# Patient Record
Sex: Male | Born: 1945
Health system: Southern US, Community
[De-identification: ages and names within clinical notes are randomized; demographics above are authoritative.]

## PROBLEM LIST (undated history)

## (undated) DIAGNOSIS — C649 Malignant neoplasm of unspecified kidney, except renal pelvis: Secondary | ICD-10-CM

## (undated) DIAGNOSIS — E785 Hyperlipidemia, unspecified: Secondary | ICD-10-CM

## (undated) DIAGNOSIS — E119 Type 2 diabetes mellitus without complications: Secondary | ICD-10-CM

## (undated) DIAGNOSIS — Z86010 Personal history of colonic polyps: Secondary | ICD-10-CM

## (undated) DIAGNOSIS — N529 Male erectile dysfunction, unspecified: Secondary | ICD-10-CM

## (undated) DIAGNOSIS — I10 Essential (primary) hypertension: Secondary | ICD-10-CM

## (undated) HISTORY — DX: Personal history of colonic polyps: Z86.010

## (undated) HISTORY — DX: Male erectile dysfunction, unspecified: N52.9

## (undated) HISTORY — DX: Type 2 diabetes mellitus without complications: E11.9

## (undated) HISTORY — DX: Malignant neoplasm of unspecified kidney, except renal pelvis: C64.9

## (undated) HISTORY — DX: Essential (primary) hypertension: I10

## (undated) HISTORY — DX: Hyperlipidemia, unspecified: E78.5

## (undated) HISTORY — PX: COLONOSCOPY: SHX174

---

## 1990-05-14 HISTORY — PX: NEPHRECTOMY: SHX65

## 2001-01-14 ENCOUNTER — Encounter: Admission: RE | Admit: 2001-01-14 | Discharge: 2001-04-14 | Payer: Self-pay | Admitting: Family Medicine

## 2005-12-24 ENCOUNTER — Ambulatory Visit: Payer: Self-pay | Admitting: Family Medicine

## 2005-12-24 LAB — CONVERTED CEMR LAB: PSA: 0.6 ng/mL

## 2006-01-28 ENCOUNTER — Ambulatory Visit: Payer: Self-pay | Admitting: Family Medicine

## 2006-03-27 ENCOUNTER — Ambulatory Visit: Payer: Self-pay | Admitting: Family Medicine

## 2006-03-27 LAB — CONVERTED CEMR LAB
ALT: 61 units/L — ABNORMAL HIGH (ref 0–40)
AST: 34 units/L (ref 0–37)
BUN: 12 mg/dL (ref 6–23)
CO2: 29 meq/L (ref 19–32)
Calcium: 9.1 mg/dL (ref 8.4–10.5)
Chol/HDL Ratio, serum: 7.2
Cholesterol: 143 mg/dL (ref 0–200)
Creatinine, Ser: 0.9 mg/dL (ref 0.4–1.5)
Glomerular Filtration Rate, Af Am: 111 mL/min/{1.73_m2}
Glucose, Bld: 111 mg/dL — ABNORMAL HIGH (ref 70–99)
Microalb Creat Ratio: 15 mg/g (ref 0.0–30.0)
Microalb, Ur: 2.2 mg/dL — ABNORMAL HIGH (ref 0.0–1.9)
Triglyceride fasting, serum: 170 mg/dL — ABNORMAL HIGH (ref 0–149)

## 2006-06-04 DIAGNOSIS — I10 Essential (primary) hypertension: Secondary | ICD-10-CM | POA: Insufficient documentation

## 2006-06-04 DIAGNOSIS — E1169 Type 2 diabetes mellitus with other specified complication: Secondary | ICD-10-CM | POA: Insufficient documentation

## 2006-06-04 DIAGNOSIS — E785 Hyperlipidemia, unspecified: Secondary | ICD-10-CM

## 2006-06-09 DIAGNOSIS — C649 Malignant neoplasm of unspecified kidney, except renal pelvis: Secondary | ICD-10-CM | POA: Insufficient documentation

## 2006-07-02 ENCOUNTER — Ambulatory Visit: Payer: Self-pay | Admitting: Family Medicine

## 2006-07-02 LAB — CONVERTED CEMR LAB
BUN: 14 mg/dL (ref 6–23)
CO2: 30 meq/L (ref 19–32)
Calcium: 9.3 mg/dL (ref 8.4–10.5)
Chloride: 104 meq/L (ref 96–112)
Cholesterol: 152 mg/dL (ref 0–200)
Creatinine, Ser: 0.8 mg/dL (ref 0.4–1.5)
Glucose, Bld: 97 mg/dL (ref 70–99)
Total CHOL/HDL Ratio: 5.8

## 2006-07-10 ENCOUNTER — Ambulatory Visit: Payer: Self-pay | Admitting: Family Medicine

## 2006-10-16 ENCOUNTER — Ambulatory Visit: Payer: Self-pay | Admitting: Family Medicine

## 2006-10-16 DIAGNOSIS — E1165 Type 2 diabetes mellitus with hyperglycemia: Secondary | ICD-10-CM

## 2006-10-17 LAB — CONVERTED CEMR LAB
AST: 36 units/L (ref 0–37)
Cholesterol: 98 mg/dL (ref 0–200)
HDL: 18.6 mg/dL — ABNORMAL LOW (ref >39.0)
LDL Cholesterol: 52 mg/dL (ref 0–99)
Microalb Creat Ratio: 8.4 mg/g (ref 0.0–30.0)
Total CHOL/HDL Ratio: 5.3
Triglycerides: 136 mg/dL (ref 0–149)
VLDL: 27 mg/dL (ref 0–40)

## 2006-10-30 ENCOUNTER — Ambulatory Visit: Payer: Self-pay | Admitting: Family Medicine

## 2007-01-22 ENCOUNTER — Ambulatory Visit: Payer: Self-pay | Admitting: Family Medicine

## 2007-01-23 LAB — CONVERTED CEMR LAB
AST: 38 units/L — ABNORMAL HIGH (ref 0–37)
CO2: 28 meq/L (ref 19–32)
Calcium: 9 mg/dL (ref 8.4–10.5)
Cholesterol: 127 mg/dL (ref 0–200)
GFR calc Af Amer: 128 mL/min
GFR calc non Af Amer: 106 mL/min
Glucose, Bld: 98 mg/dL (ref 70–99)
HDL: 18.7 mg/dL — ABNORMAL LOW (ref >39.0)
LDL Cholesterol: 72 mg/dL (ref 0–99)
Sodium: 140 meq/L (ref 135–145)
Total CHOL/HDL Ratio: 6.8

## 2007-02-12 ENCOUNTER — Telehealth (INDEPENDENT_AMBULATORY_CARE_PROVIDER_SITE_OTHER): Payer: Self-pay | Admitting: *Deleted

## 2007-02-12 ENCOUNTER — Ambulatory Visit: Payer: Self-pay | Admitting: Family Medicine

## 2007-03-19 ENCOUNTER — Telehealth (INDEPENDENT_AMBULATORY_CARE_PROVIDER_SITE_OTHER): Payer: Self-pay | Admitting: *Deleted

## 2007-03-26 ENCOUNTER — Encounter (INDEPENDENT_AMBULATORY_CARE_PROVIDER_SITE_OTHER): Payer: Self-pay | Admitting: Family Medicine

## 2007-05-05 ENCOUNTER — Telehealth (INDEPENDENT_AMBULATORY_CARE_PROVIDER_SITE_OTHER): Payer: Self-pay | Admitting: *Deleted

## 2007-05-22 ENCOUNTER — Ambulatory Visit: Payer: Self-pay | Admitting: Family Medicine

## 2007-05-22 LAB — CONVERTED CEMR LAB
Cholesterol: 132 mg/dL (ref 0–200)
Hgb A1c MFr Bld: 8.6 % — ABNORMAL HIGH (ref 4.6–6.0)
LDL Cholesterol: 72 mg/dL (ref 0–99)
Total CHOL/HDL Ratio: 5.9
Triglycerides: 191 mg/dL — ABNORMAL HIGH (ref 0–149)

## 2007-05-27 ENCOUNTER — Telehealth (INDEPENDENT_AMBULATORY_CARE_PROVIDER_SITE_OTHER): Payer: Self-pay | Admitting: *Deleted

## 2007-05-29 ENCOUNTER — Ambulatory Visit: Payer: Self-pay | Admitting: Family Medicine

## 2007-08-20 ENCOUNTER — Ambulatory Visit: Payer: Self-pay | Admitting: Internal Medicine

## 2007-08-25 ENCOUNTER — Telehealth (INDEPENDENT_AMBULATORY_CARE_PROVIDER_SITE_OTHER): Payer: Self-pay | Admitting: *Deleted

## 2007-08-25 LAB — CONVERTED CEMR LAB
AST: 36 units/L (ref 0–37)
CO2: 28 meq/L (ref 19–32)
Chloride: 109 meq/L (ref 96–112)
Glucose, Bld: 129 mg/dL — ABNORMAL HIGH (ref 70–99)
Hgb A1c MFr Bld: 8 % — ABNORMAL HIGH (ref 4.6–6.0)
Sodium: 140 meq/L (ref 135–145)

## 2007-08-27 ENCOUNTER — Ambulatory Visit: Payer: Self-pay | Admitting: Internal Medicine

## 2007-11-18 ENCOUNTER — Ambulatory Visit: Payer: Self-pay | Admitting: Internal Medicine

## 2007-11-20 LAB — CONVERTED CEMR LAB
Creatinine,U: 124.9 mg/dL
Hgb A1c MFr Bld: 7.9 % — ABNORMAL HIGH (ref 4.6–6.0)
Microalb Creat Ratio: 62.4 mg/g — ABNORMAL HIGH (ref 0.0–30.0)
Microalb, Ur: 7.8 mg/dL — ABNORMAL HIGH (ref 0.0–1.9)

## 2007-11-24 ENCOUNTER — Ambulatory Visit: Payer: Self-pay | Admitting: Internal Medicine

## 2007-11-24 DIAGNOSIS — G5600 Carpal tunnel syndrome, unspecified upper limb: Secondary | ICD-10-CM

## 2007-11-24 DIAGNOSIS — G5601 Carpal tunnel syndrome, right upper limb: Secondary | ICD-10-CM | POA: Insufficient documentation

## 2007-12-09 ENCOUNTER — Ambulatory Visit: Payer: Self-pay | Admitting: Internal Medicine

## 2007-12-17 ENCOUNTER — Telehealth (INDEPENDENT_AMBULATORY_CARE_PROVIDER_SITE_OTHER): Payer: Self-pay | Admitting: *Deleted

## 2007-12-17 LAB — CONVERTED CEMR LAB
BUN: 18 mg/dL (ref 6–23)
Calcium: 9.2 mg/dL (ref 8.4–10.5)
Creatinine, Ser: 0.9 mg/dL (ref 0.4–1.5)
GFR calc Af Amer: 111 mL/min
Glucose, Bld: 293 mg/dL — ABNORMAL HIGH (ref 70–99)

## 2008-01-15 ENCOUNTER — Telehealth (INDEPENDENT_AMBULATORY_CARE_PROVIDER_SITE_OTHER): Payer: Self-pay | Admitting: *Deleted

## 2008-02-17 ENCOUNTER — Ambulatory Visit: Payer: Self-pay | Admitting: Internal Medicine

## 2008-02-24 ENCOUNTER — Telehealth (INDEPENDENT_AMBULATORY_CARE_PROVIDER_SITE_OTHER): Payer: Self-pay | Admitting: *Deleted

## 2008-03-01 ENCOUNTER — Telehealth (INDEPENDENT_AMBULATORY_CARE_PROVIDER_SITE_OTHER): Payer: Self-pay | Admitting: *Deleted

## 2008-03-10 ENCOUNTER — Encounter: Payer: Self-pay | Admitting: Internal Medicine

## 2008-03-10 ENCOUNTER — Telehealth (INDEPENDENT_AMBULATORY_CARE_PROVIDER_SITE_OTHER): Payer: Self-pay | Admitting: *Deleted

## 2008-03-22 ENCOUNTER — Telehealth (INDEPENDENT_AMBULATORY_CARE_PROVIDER_SITE_OTHER): Payer: Self-pay | Admitting: *Deleted

## 2008-05-25 ENCOUNTER — Ambulatory Visit: Payer: Self-pay | Admitting: Internal Medicine

## 2008-05-28 ENCOUNTER — Telehealth (INDEPENDENT_AMBULATORY_CARE_PROVIDER_SITE_OTHER): Payer: Self-pay | Admitting: *Deleted

## 2008-05-31 ENCOUNTER — Ambulatory Visit: Payer: Self-pay | Admitting: Internal Medicine

## 2008-06-01 ENCOUNTER — Telehealth (INDEPENDENT_AMBULATORY_CARE_PROVIDER_SITE_OTHER): Payer: Self-pay | Admitting: *Deleted

## 2008-06-01 LAB — CONVERTED CEMR LAB
ALT: 46 units/L (ref 0–53)
AST: 26 units/L (ref 0–37)
Hgb A1c MFr Bld: 7.7 % — ABNORMAL HIGH (ref 4.6–6.0)

## 2008-10-04 ENCOUNTER — Ambulatory Visit: Payer: Self-pay | Admitting: Internal Medicine

## 2008-10-08 ENCOUNTER — Ambulatory Visit: Payer: Self-pay | Admitting: Internal Medicine

## 2008-10-13 ENCOUNTER — Telehealth (INDEPENDENT_AMBULATORY_CARE_PROVIDER_SITE_OTHER): Payer: Self-pay | Admitting: *Deleted

## 2008-10-13 LAB — CONVERTED CEMR LAB
BUN: 15 mg/dL (ref 6–23)
Calcium: 9 mg/dL (ref 8.4–10.5)
Chloride: 111 meq/L (ref 96–112)
Creatinine, Ser: 0.8 mg/dL (ref 0.4–1.5)
Hgb A1c MFr Bld: 7.1 % — ABNORMAL HIGH (ref 4.6–6.5)

## 2009-02-08 ENCOUNTER — Telehealth (INDEPENDENT_AMBULATORY_CARE_PROVIDER_SITE_OTHER): Payer: Self-pay | Admitting: *Deleted

## 2009-02-08 ENCOUNTER — Ambulatory Visit: Payer: Self-pay | Admitting: Internal Medicine

## 2009-02-23 ENCOUNTER — Ambulatory Visit: Payer: Self-pay | Admitting: Internal Medicine

## 2009-03-03 LAB — CONVERTED CEMR LAB
AST: 27 units/L (ref 0–37)
Cholesterol: 138 mg/dL (ref 0–200)
Hgb A1c MFr Bld: 7 % — ABNORMAL HIGH (ref 4.6–6.5)
Microalb Creat Ratio: 14.8 mg/g (ref 0.0–30.0)
Microalb, Ur: 1.7 mg/dL (ref 0.0–1.9)
Total CHOL/HDL Ratio: 6
VLDL: 49.4 mg/dL — ABNORMAL HIGH (ref 0.0–40.0)

## 2009-03-08 ENCOUNTER — Encounter: Payer: Self-pay | Admitting: Internal Medicine

## 2009-03-11 ENCOUNTER — Encounter: Payer: Self-pay | Admitting: Internal Medicine

## 2009-05-31 ENCOUNTER — Ambulatory Visit: Payer: Self-pay | Admitting: Internal Medicine

## 2009-06-02 LAB — CONVERTED CEMR LAB
Basophils Relative: 0.7 % (ref 0.0–3.0)
Chloride: 105 meq/L (ref 96–112)
Creatinine,U: 74.7 mg/dL
Direct LDL: 45.7 mg/dL
Eosinophils Relative: 9.3 % — ABNORMAL HIGH (ref 0.0–5.0)
GFR calc non Af Amer: 104.37 mL/min (ref >60)
HCT: 43.1 % (ref 39.0–52.0)
HDL: 23.3 mg/dL — ABNORMAL LOW (ref >39.00)
Hemoglobin: 14.1 g/dL (ref 13.0–17.0)
Hgb A1c MFr Bld: 7.5 % — ABNORMAL HIGH (ref 4.6–6.5)
Lymphs Abs: 1.6 10*3/uL (ref 0.7–4.0)
MCV: 99.3 fL (ref 78.0–100.0)
Microalb Creat Ratio: 17.4 mg/g (ref 0.0–30.0)
Microalb, Ur: 1.3 mg/dL (ref 0.0–1.9)
Monocytes Relative: 11.2 % (ref 3.0–12.0)
Neutro Abs: 3.1 10*3/uL (ref 1.4–7.7)
Potassium: 4.7 meq/L (ref 3.5–5.1)
Sodium: 141 meq/L (ref 135–145)
VLDL: 40.8 mg/dL — ABNORMAL HIGH (ref 0.0–40.0)
WBC: 5.9 10*3/uL (ref 4.5–10.5)

## 2009-06-07 ENCOUNTER — Ambulatory Visit: Payer: Self-pay | Admitting: Internal Medicine

## 2009-06-08 ENCOUNTER — Encounter (INDEPENDENT_AMBULATORY_CARE_PROVIDER_SITE_OTHER): Payer: Self-pay | Admitting: *Deleted

## 2009-06-09 ENCOUNTER — Encounter (INDEPENDENT_AMBULATORY_CARE_PROVIDER_SITE_OTHER): Payer: Self-pay | Admitting: *Deleted

## 2009-06-20 ENCOUNTER — Telehealth: Payer: Self-pay | Admitting: Internal Medicine

## 2009-06-29 ENCOUNTER — Encounter (INDEPENDENT_AMBULATORY_CARE_PROVIDER_SITE_OTHER): Payer: Self-pay | Admitting: *Deleted

## 2009-07-04 ENCOUNTER — Encounter (INDEPENDENT_AMBULATORY_CARE_PROVIDER_SITE_OTHER): Payer: Self-pay | Admitting: *Deleted

## 2009-07-04 ENCOUNTER — Ambulatory Visit: Payer: Self-pay | Admitting: Internal Medicine

## 2009-07-12 LAB — HM COLONOSCOPY

## 2009-07-18 ENCOUNTER — Ambulatory Visit: Payer: Self-pay | Admitting: Internal Medicine

## 2009-07-18 DIAGNOSIS — Z860100 Personal history of colon polyps, unspecified: Secondary | ICD-10-CM | POA: Insufficient documentation

## 2009-07-18 HISTORY — DX: Personal history of colonic polyps: Z86.010

## 2009-07-18 HISTORY — DX: Personal history of colon polyps, unspecified: Z86.0100

## 2009-07-23 ENCOUNTER — Telehealth: Payer: Self-pay | Admitting: Gastroenterology

## 2009-07-25 ENCOUNTER — Telehealth: Payer: Self-pay | Admitting: Internal Medicine

## 2009-07-26 ENCOUNTER — Encounter: Payer: Self-pay | Admitting: Internal Medicine

## 2009-08-03 ENCOUNTER — Encounter: Payer: Self-pay | Admitting: Internal Medicine

## 2009-08-30 ENCOUNTER — Encounter: Payer: Self-pay | Admitting: Internal Medicine

## 2009-10-12 ENCOUNTER — Encounter: Payer: Self-pay | Admitting: Internal Medicine

## 2009-12-07 ENCOUNTER — Encounter: Payer: Self-pay | Admitting: Internal Medicine

## 2009-12-21 ENCOUNTER — Ambulatory Visit: Payer: Self-pay | Admitting: Internal Medicine

## 2009-12-21 DIAGNOSIS — N529 Male erectile dysfunction, unspecified: Secondary | ICD-10-CM

## 2010-02-23 ENCOUNTER — Encounter (INDEPENDENT_AMBULATORY_CARE_PROVIDER_SITE_OTHER): Payer: Self-pay | Admitting: *Deleted

## 2010-03-23 ENCOUNTER — Encounter: Payer: Self-pay | Admitting: Internal Medicine

## 2010-04-26 ENCOUNTER — Encounter: Payer: Self-pay | Admitting: Internal Medicine

## 2010-04-26 ENCOUNTER — Ambulatory Visit: Payer: Self-pay | Admitting: Internal Medicine

## 2010-06-11 LAB — CONVERTED CEMR LAB
ALT: 58 units/L — ABNORMAL HIGH (ref 0–53)
Basophils Relative: 0.3 % (ref 0.0–3.0)
Bilirubin, Direct: 0.1 mg/dL (ref 0.0–0.3)
Eosinophils Relative: 4.1 % (ref 0.0–5.0)
HCT: 46.1 % (ref 39.0–52.0)
HDL: 25.2 mg/dL — ABNORMAL LOW (ref >39.0)
Hemoglobin: 15.9 g/dL (ref 13.0–17.0)
Microalb, Ur: 14.2 mg/dL — ABNORMAL HIGH (ref 0.0–1.9)
Monocytes Absolute: 0.6 10*3/uL (ref 0.1–1.0)
Monocytes Relative: 8.8 % (ref 3.0–12.0)
PSA: 0.58 ng/mL (ref 0.10–4.00)
Platelets: 242 10*3/uL (ref 150–400)
RBC: 4.76 M/uL (ref 4.22–5.81)
TSH: 0.72 microintl units/mL (ref 0.35–5.50)
Total CHOL/HDL Ratio: 5.1
Total Protein: 7.4 g/dL (ref 6.0–8.3)
WBC: 6.3 10*3/uL (ref 4.5–10.5)

## 2010-06-13 NOTE — Letter (Signed)
Summary: [facility] Endocrinology & Diabetes  [facility] Endocrinology & Diabetes   Imported By: Lanelle Bal 10/27/2009 09:18:04  _____________________________________________________________________  External Attachment:    Type:   Image     Comment:   External Document

## 2010-06-13 NOTE — Miscellaneous (Signed)
Summary: flu shot @ [facility]    Immunization History:  Influenza Immunization History:    Influenza:  given @ [facility] (02/19/2010)

## 2010-06-13 NOTE — Consult Note (Signed)
Summary: [facility] Endocrinology & Diabetes  [facility] Endocrinology & Diabetes   Imported By: Lanelle Bal 08/09/2009 11:46:45  _____________________________________________________________________  External Attachment:    Type:   Image     Comment:   External Document

## 2010-06-13 NOTE — Letter (Signed)
Summary: [facility] Endocrinology & Diabetes  [facility] Endocrinology & Diabetes   Imported By: Lanelle Bal 12/14/2009 14:04:54  _____________________________________________________________________  External Attachment:    Type:   Image     Comment:   External Document

## 2010-06-13 NOTE — Letter (Signed)
Summary: Primary Care Consult Scheduled Letter  [facility] [place]  [address] [place], [place] [address]   Phone: [phone]  Fax: [phone]      06/09/2009 MRN: [id number]  SALVADORE VALVANO [address] [place], [place]  [address]    Dear Mr. DILWORTH,    We have scheduled an appointment for you.  At the recommendation of Dr. Wanda Plump, we have scheduled you a consult with Dr. Reather Littler with [facility] Endocrinology & Diabetes on 07-14-2009 at 11:15am.  Their address is [address], [place] [address]. The office phone number is [phone].  If this appointment day and time is not convenient for you, please feel free to call the office of the doctor you are being referred to at the number listed above and reschedule the appointment.    It is important for you to keep your scheduled appointments. We are here to make sure you are given good patient care.   Thank you,    Renee, Patient Care Coordinator [facility]

## 2010-06-13 NOTE — Letter (Signed)
Summary: Diabetic Instructions  [facility]  [address] [place], [place] [address]   Phone: [phone]  Fax: [phone]    Manuel Petty 1946/04/19 MRN: [id number]   _  _   ORAL DIABETIC MEDICATION INSTRUCTIONS  The day before your procedure:   Take your diabetic pill as you do normally  The day of your procedure:   Do not take your diabetic pill    We will check your blood sugar levels during the admission process and again in Recovery before discharging you home  ________________________________________________________________________  _  _   INSULIN (LONG ACTING) MEDICATION INSTRUCTIONS (Lantus, NPH, 70/30, Humulin, Novolin-N)   The day before your procedure:   Take  your regular evening dose    The day of your procedure:   Do not take your morning dose

## 2010-06-13 NOTE — Letter (Signed)
Summary: [facility] Endocrinology & Diabetes  [facility] Endocrinology & Diabetes   Imported By: Lanelle Bal 04/05/2010 12:38:50  _____________________________________________________________________  External Attachment:    Type:   Image     Comment:   External Document

## 2010-06-13 NOTE — Letter (Signed)
Summary: Patient Notice- Polyp Results  [facility]  [address] [place], [place] [address]   Phone: [phone]  Fax: [phone]        July 26, 2009 MRN: [id number]    Manuel Petty [address] [place], [place]  [address]    Dear Mr. PALMERI,  The polyps removed from your colon were adenomatous. This means that they were pre-cancerous or that  they had the potential to change into cancer over time.   I recommend that you have a repeat colonoscopy in 3 years to determine if you have developed any new polyps over time. If you develop any new rectal bleeding, abdominal pain or significant bowel habit changes, please contact us before then.  In addition to repeating colonoscopy, changing health habits may reduce your risk of having more colon polyps and possibly, colon cancer. You may lower your risk of future polyps and colon cancer by adopting healthy habits such as not smoking or using tobacco (if you do), being physically active, losing weight (if overweight), and eating a diet which includes fruits and vegetables and limits red meat.  Please call us if you are having persistent problems or have questions about your condition that have not been fully answered at this time.   Sincerely,  Iva Boop MD, [facility]  This letter has been electronically signed by your physician.  Appended Document: Patient Notice- Polyp Results Letter mailed 3.16.11

## 2010-06-13 NOTE — Letter (Signed)
Summary: [facility] Instructions  [facility]  [address] [place], [place] [address]   Phone: [phone]  Fax: [phone]       Manuel Petty    [birth year]    MRN: [id number]        Procedure Day /Date: Monday 07/18/2009     Arrival Time: 7:30 am      Procedure Time: 8:30 am     Location of Procedure:                    _x _   Endoscopy Center (4th Floor)   PREPARATION FOR COLONOSCOPY WITH MOVIPREP   Starting 5 days prior to your procedure Wednesday 3/2 do not eat nuts, seeds, popcorn, corn, beans, peas,  salads, or any raw vegetables.  Do not take any fiber supplements (e.g. Metamucil, Citrucel, and Benefiber).  THE DAY BEFORE YOUR PROCEDURE         DATE: Sunday 3/6  1.  Drink clear liquids the entire day-NO SOLID FOOD  2.  Do not drink anything colored red or purple.  Avoid juices with pulp.  No orange juice.  3.  Drink at least 64 oz. (8 glasses) of fluid/clear liquids during the day to prevent dehydration and help the prep work efficiently.  CLEAR LIQUIDS INCLUDE: Water Jello Ice Popsicles Tea (sugar ok, no milk/cream) Powdered fruit flavored drinks Coffee (sugar ok, no milk/cream) Gatorade Juice: apple, white grape, white cranberry  Lemonade Clear bullion, consomm, broth Carbonated beverages (any kind) Strained chicken noodle soup Hard Candy                             4.  In the morning, mix first dose of MoviPrep solution:    Empty 1 Pouch A and 1 Pouch B into the disposable container    Add lukewarm drinking water to the top line of the container. Mix to dissolve    Refrigerate (mixed solution should be used within 24 hrs)  5.  Begin drinking the prep at 5:00 p.m. The MoviPrep container is divided by 4 marks.   Every 15 minutes drink the solution down to the next mark (approximately 8 oz) until the full liter is complete.   6.  Follow completed prep with 16 oz of clear liquid of your choice (Nothing red or purple).   Continue to drink clear liquids until bedtime.  7.  Before going to bed, mix second dose of MoviPrep solution:    Empty 1 Pouch A and 1 Pouch B into the disposable container    Add lukewarm drinking water to the top line of the container. Mix to dissolve    Refrigerate  THE DAY OF YOUR PROCEDURE      DATE: Monday 3/7  Beginning at 3:30 a.m. (5 hours before procedure):         1. Every 15 minutes, drink the solution down to the next mark (approx 8 oz) until the full liter is complete.  2. Follow completed prep with 16 oz. of clear liquid of your choice.    3. You may drink clear liquids until 6:30 am (2 HOURS BEFORE PROCEDURE).   MEDICATION INSTRUCTIONS  Unless otherwise instructed, you should take regular prescription medications with a small sip of water   as early as possible the morning of your procedure.  Diabetic patients - see separate instructions.   Additional medication instructions: Hold Lisinopril/HCTZ morning of procedure.  OTHER INSTRUCTIONS  You will need a responsible adult at least 62 years of age to accompany you and drive you home.   This person must remain in the waiting room during your procedure.  Wear loose fitting clothing that is easily removed.  Leave jewelry and other valuables at home.  However, you may wish to bring a book to read or  an iPod/MP3 player to listen to music as you wait for your procedure to start.  Remove all body piercing jewelry and leave at home.  Total time from sign-in until discharge is approximately 2-3 hours.  You should go home directly after your procedure and rest.  You can resume normal activities the  day after your procedure.  The day of your procedure you should not:   Drive   Make legal decisions   Operate machinery   Drink alcohol   Return to work  You will receive specific instructions about eating, activities and medications before you leave.    The above instructions have been  reviewed and explained to me by   Ezra Sites RN  July 04, 2009 8:11 AM   I fully understand and can verbalize these instructions _____________________________ Date _________

## 2010-06-13 NOTE — Letter (Signed)
Summary: Previsit letter  [facility]  [address] [place], [place] [address]   Phone: [phone]  Fax: [phone]       06/08/2009 MRN: [id number]  Manuel Petty [address] [place], [place]  [address]  Dear [name],  Welcome to the Gastroenterology Division at [facility].    You are scheduled to see a nurse for your pre-procedure visit on 07-04-09 at 8:00a.m. on the 3rd floor at [facility], [address]  We ask that you try to arrive at our office 15 minutes prior to your appointment time to allow for check-in.  Your nurse visit will consist of discussing your medical and surgical history, your immediate family medical history, and your medications.    Please bring a complete list of all your medications or, if you prefer, bring the medication bottles and we will list them.  We will need to be aware of both prescribed and over the counter drugs.  We will need to know exact dosage information as well.  If you are on blood thinners (Coumadin, Plavix, Aggrenox, Ticlid, etc.) please call our office today/prior to your appointment, as we need to consult with your physician about holding your medication.   Please be prepared to read and sign documents such as consent forms, a financial agreement, and acknowledgement forms.  If necessary, and with your consent, a friend or relative is welcome to sit-in on the nurse visit with you.  Please bring your insurance card so that we may make a copy of it.  If your insurance requires a referral to see a specialist, please bring your referral form from your primary care physician.  No co-pay is required for this nurse visit.     If you cannot keep your appointment, please call [phone] to cancel or reschedule prior to your appointment date.  This allows [place] the opportunity to schedule an appointment for another patient in need of care.    Thank you for choosing [facility] for your  medical needs.  We appreciate the opportunity to care for you.  Please visit [place] at our website  to learn more about our practice.                     Sincerely.                                                                                                                   The Gastroenterology Division

## 2010-06-13 NOTE — Procedures (Signed)
Summary: Colonoscopy  Patient: Manuel Petty Note: All result statuses are Final unless otherwise noted.  Tests: (1) Colonoscopy (COL)   COL Colonoscopy           DONE     [facility]     [address]     [place], [place]  [address]           COLONOSCOPY PROCEDURE REPORT           PATIENT:  Manuel, Petty  MR#:  [id number]     BIRTHDATE:  04/16/1946, 63 yrs. old  GENDER:  male           ENDOSCOPIST:  Iva Boop, MD, [facility]     Referred by:  Willow Ora, M.D.           PROCEDURE DATE:  07/18/2009     PROCEDURE:  Colonoscopy with biopsy and snare polypectomy     ASA CLASS:  Class II     INDICATIONS:  Routine Risk Screening           MEDICATIONS:   Fentanyl 100 mcg IV, Versed 9 mg           DESCRIPTION OF PROCEDURE:   After the risks benefits and     alternatives of the procedure were thoroughly explained, informed     consent was obtained.  Digital rectal exam was performed and     revealed no abnormalities and normal prostate.   The LB CF-H180AL     [id number] endoscope was introduced through the anus and advanced to     the cecum, which was identified by both the appendix and ileocecal     valve, limited by a redundant colon.  Supine position and     bilateral abdominal pressure with return to left side and     bilateral abdominal pressure required.  The quality of the prep     was adequate, using MoviPrep.  The instrument was then slowly     withdrawn as the colon was fully examined.     Insertion: 18:19 minutes Withdrawal: 18:41 minutes     <<PROCEDUREIMAGES>>           FINDINGS:  Two polyps were found in the ascending colon. They were     diminutive. One polyp was removed using cold biopsy forceps. One     polyp was snared without cautery. Retrieval was successful. A     pedunculated polyp was found in the distal sigmoid colon. It was     12 mm in size. Polyp was snared, then cauterized with monopolar     cautery. Retrieval was successful.  A sessile  polyp was found in     the more proximal sigmoid colon. It was 7 mm in size. Polyp was     snared, then cauterized with monopolar cautery. Retrieval was     successful.   Two polyps were found in the rectum. 8 mm flat polyp     and a 1 cm pedunculated polyp.The pedunculated polyp was snared,     then cauterized with monopolar cautery. Retrieval was     successful.The flat polyp was snared without cautery. Retrieval     was successful. This was otherwise a normal examination of the     colon. It was redundant.   Retroflexed views in the rectum     revealed no abnormalities.    The scope was then withdrawn from     the patient and the procedure completed.  COMPLICATIONS:  None           ENDOSCOPIC IMPRESSION:     1) Two polyps in the ascending colon - removed     2) 12 mm pedunculated polyp in the sigmoid colon - removed     3) 7 mm sessile polyp in the sigmoid colon - removed     4) Two polyps in the rectum - removed     5) Otherwise normal examination - adequate prep     6) Moderately redundnat colon with moderately difficult     insertion           6 polyps total           RECOMMENDATIONS:     1) No aspirin or NSAID's for 2 weeks           REPEAT EXAM:  In for Colonoscopy, pending biopsy results.           Iva Boop, MD, [facility]           CC:  Willow Ora, MD     The Patient           n.     eSIGNED:   Iva Boop at 07/18/2009 09:27 AM           Page 2 of 3   Ayyub, Krall, [id number]  Note: An exclamation mark (!) indicates a result that was not dispersed into the flowsheet. Document Creation Date: 07/18/2009 9:27 AM _______________________________________________________________________  (1) Order result status: Final Collection or observation date-time: 07/18/2009 09:14 Requested date-time:  Receipt date-time:  Reported date-time:  Referring Physician:   Ordering Physician: Stan Head [phone]) Specimen Source:  Source: Launa Grill  Order Number: [id number] Lab site:   Appended Document: Colonoscopy     Procedures Next Due Date:    Colonoscopy: 07/2012

## 2010-06-13 NOTE — Progress Notes (Signed)
Summary: ?change metformin?  Phone Note Call from Patient Call back at [phone]   Details for Reason: PATIENT LEFT MESSAGE ON  VM REQUESTING RETURN CALL Summary of Call: left message on machine to return call Shary Decamp  June 20, 2009 10:59 AM  Elmon Kirschner xr 1000mg  2 daily was very expensive.  Ok to change to metformin xr 500mg  4 daily? (generic does not come in 1000mg ), [facility] Shary Decamp  June 20, 2009 1:30 PM    Follow-up for Phone Call        okay as long as it is the extended release formulation Follow-up by: Stavroula Rohde E. Jovoni Borkenhagen MD,  June 20, 2009 3:15 PM    New/Updated Medications: GLUCOPHAGE XR 500 MG XR24H-TAB (METFORMIN HCL) 4 by mouth once daily Prescriptions: GLUCOPHAGE XR 500 MG XR24H-TAB (METFORMIN HCL) 4 by mouth once daily  #360 x 2   Entered by:   Shary Decamp   Authorized by:   Nolon Rod. Adyline Huberty MD   Signed by:   Shary Decamp on 06/20/2009   Method used:   Printed then faxed to ...       [facility] (mail-order)             , [place]         Ph: [phone]       Fax: [phone]   RxID:   [id number]

## 2010-06-13 NOTE — Letter (Signed)
Summary: [facility] Endocrinology & Diabetes  [facility] Endocrinology & Diabetes   Imported By: Lanelle Bal 09/05/2009 13:27:14  _____________________________________________________________________  External Attachment:    Type:   Image     Comment:   External Document

## 2010-06-13 NOTE — Progress Notes (Signed)
  Phone Note Call from Patient   Caller: Patient Summary of Call: had red rectal bleeding times one this AM, next BM was darker (more solid with some stool).  No abd pain, no orthostatic symptoms.  feels otherwise fine.  I told him to rest today, if he has more rectal bleeding or feels orthostatic, cp, sob to call back or go to ER. Initial call taken by: Rachael Fee MD,  July 23, 2009 2:37 PM

## 2010-06-13 NOTE — Miscellaneous (Signed)
Summary: [facility]  Clinical Lists Changes  Medications: Added new medication of MOVIPREP 100 GM  SOLR (PEG-KCL-NACL-NASULF-NA ASC-C) As per prep instructions. - Signed Rx of MOVIPREP 100 GM  SOLR (PEG-KCL-NACL-NASULF-NA ASC-C) As per prep instructions.;  #1 x 0;  Signed;  Entered by: Ezra Sites RN;  Authorized by: Iva Boop MD, [facility];  Method used: Electronically to [facility] [id number]*, [address] [place], [place], [place]  [address], Ph: [phone] or [phone], Fax: [phone] Observations: Added new observation of NKA: T (07/04/2009 7:49)    Prescriptions: MOVIPREP 100 GM  SOLR (PEG-KCL-NACL-NASULF-NA ASC-C) As per prep instructions.  #1 x 0   Entered by:   Ezra Sites RN   Authorized by:   Iva Boop MD, [facility]   Signed by:   Ezra Sites RN on 07/04/2009   Method used:   Electronically to        [facility] [address] [id number] (retail)       [address]       [place], [place]  [address]       Ph: [phone] or [phone]       Fax: [phone]   RxID:   [id number]

## 2010-06-13 NOTE — Assessment & Plan Note (Signed)
Summary: cpx/kdc  Flu Vaccine Consent Questions     Do you have a history of severe allergic reactions to this vaccine? no    Any prior history of allergic reactions to egg and/or gelatin? no    Do you have a sensitivity to the preservative Thimersol? no    Do you have a past history of Guillan-Barre Syndrome? no    Do you currently have an acute febrile illness? no    Have you ever had a severe reaction to latex? no    Vaccine information given and explained to patient? yes    Are you currently pregnant? no    Lot Number:[id number]   Exp Date:11/10/2009   Site Given right Deltoid IM Shary Decamp  June 07, 2009 4:05 PM   Vital Signs:  Patient profile:   65 year old male Height:      70 inches Weight:      185.4 pounds BMI:     26.70 Pulse rate:   68 / minute BP sitting:   120 / 70  Vitals Entered By: Shary Decamp (June 07, 2009 3:25 PM) .lbCC: cpx  Comments  - pt takes metformin 2 once daily instead of two times a day, states if he has to take two times a day he will not remember to take 2nd dose  - never had colonoscopy Shary Decamp  June 07, 2009 3:26 PM    History of Present Illness: CPX   HYPERTENSION  ambulatory BPs 120s , good medication compliance   DM pt takes metformin 2 once daily instead of two times a day, states if he has to take two times a day he will not remember to take 2nd dose     Preventive Screening-Counseling & Management  Alcohol-Tobacco     Alcohol drinks/day: 1-2 a week     Smoking Status: never  Caffeine-Diet-Exercise     Does Patient Exercise: no  Current Medications (verified): 1)  Lantus Solostar 100 Unit/ml Soln (Insulin Glargine) .... 65-75 U At Bedtime 2)  Lovastatin 40 Mg Tabs (Lovastatin) .... Take One Tablet Daily 3)  Adult Aspirin Ec Low Strength 81 Mg  Tbec (Aspirin) 4)  Bd Ultra-Fine Pen Needles 29g X 12.59mm Misc (Insulin Pen Needle) .... As Directed 5)  Lisinopril-Hydrochlorothiazide 20-25 Mg Tabs  (Lisinopril-Hydrochlorothiazide) .Marland Kitchen.. 1 By Mouth Once Daily 6)  Metformin Hcl 1000 Mg Tabs (Metformin Hcl) .... Two Times A Day 7)  Accu-Chek Aviva  Strp (Glucose Blood) .... Test Three Times A Day  Allergies (verified): No Known Drug Allergies  Past History:  Past Medical History: DM, UNCOMPLICATED, TYPE II, UNCONTROLLED  Dx aprox. 2000 HYPERLIPIDEMIA   HYPERTENSION   RENAL CELL CANCER 1992, has 1 kidney ; does not see urology any more ED  Past Surgical History: Reviewed history from 02/17/2008 and no changes required. Left Nephrectomy 1992  Family History: CAD - F (MI 63), bro HTN - F DM - F prostate Ca - bro colon Ca - no stroke - no F - deceased MI M- deceased brain tumor  Social History: Married 1 children [employer]  Never Smoked Alcohol use-no Drug use-no exercise- active but not exercising regulalrly diet-- healthy Does Patient Exercise:  no  Review of Systems General:  Denies fatigue, fever, and weight loss. CV:  Denies chest pain or discomfort and swelling of feet. Resp:  Denies cough and shortness of breath. GI:  Denies bloody stools, diarrhea, nausea, and vomiting. GU:  Denies hematuria, urinary frequency, and urinary hesitancy. Neuro:  Denies headaches. Psych:  Denies anxiety and depression.  Physical Exam  General:  alert and well-developed.  alert and well-developed.   Neck:  no masses, no thyromegaly, and normal carotid upstroke.  no masses, no thyromegaly, and normal carotid upstroke.   Lungs:  normal respiratory effort, no intercostal retractions, no accessory muscle use, and normal breath sounds.    Heart:  normal rate, regular rhythm, no murmur, and no gallop.   Abdomen:  soft, non-tender, no distention, no masses, no guarding, and no rigidity.  soft, non-tender, no distention, no masses, no guarding, and no rigidity.   Rectal:  No external abnormalities noted. Normal sphincter tone. he has a small polyp at 6 o'clock in the rectum,  Hemoccult negative Prostate:  Prostate gland firm and smooth, no enlargement, nodularity, tenderness, mass, asymmetry or induration. Extremities:  no edema  Diabetes Management Exam:    Foot Exam (with socks and/or shoes not present):       Sensory-Pinprick/Light touch:          Left medial foot (L-4): normal          Left dorsal foot (L-5): normal          Left lateral foot (S-1): normal          Right medial foot (L-4): normal          Right dorsal foot (L-5): normal          Right lateral foot (S-1): normal       Sensory-Monofilament:          Left foot: normal          Right foot: normal       Inspection:          Left foot: normal          Right foot: normal       Nails:          Left foot: normal          Right foot: normal   Impression & Recommendations:  Problem # 1:  HEALTH SCREENING (ICD-V70.0) Td 2009 pneumonia shot 2009 flu shot : pro/cons  discussed he agreed to take one never had a colonoscopy, DRE showed a polyp: referral for colonoscopy all labs reviewed, check a PSA  Orders: Venipuncture (11914) TLB-PSA (Prostate Specific Antigen) (84153-PSA) Gastroenterology Referral (GI)  Problem # 2:  DM, UNCOMPLICATED, TYPE II, UNCONTROLLED (ICD-250.02)  last eye exam 02/2009, negative lantus dose betwen 60 to 70, patient decides whether to take more or less insulin based on his diet has occ. nocturnal hypoglycemia ambulatory CBGs in AM 80 to 130  he is chronically under control, referred to endocrinology: Multi-injection regimen?  [place]?---likes to see Dr Lucianne Muss has a hard time remembering taking metformin twice a day, changed to metformin XR His updated medication list for this problem includes:    Lantus Solostar 100 Unit/ml Soln (Insulin glargine) .Marland KitchenMarland KitchenMarland KitchenMarland Kitchen 65-75 u at bedtime    Adult Aspirin Ec Low Strength 81 Mg Tbec (Aspirin)    Lisinopril-hydrochlorothiazide 20-25 Mg Tabs (Lisinopril-hydrochlorothiazide) .Marland Kitchen... 1 by mouth once daily    Glumetza 1000 Mg  Xr24h-tab (Metformin hcl) .Marland Kitchen..Marland Kitchen Two tablets by mouth daily  Orders: Endocrinology Referral (Endocrine)  Problem # 3:  HYPERLIPIDEMIA NEC/NOS (ICD-272.4) triglycerides continue to be elevated, anticipate TG will get better once DM better His updated medication list for this problem includes:    Lovastatin 40 Mg Tabs (Lovastatin) .Marland Kitchen... Take one tablet daily  Problem # 4:  HYPERTENSION (ICD-401.9)  at goal  His updated medication list for this problem includes:    Lisinopril-hydrochlorothiazide 20-25 Mg Tabs (Lisinopril-hydrochlorothiazide) .Marland Kitchen... 1 by mouth once daily  BP today: 120/70 Prior BP: 120/82 (02/08/2009)  Labs Reviewed: K+: 4.7 (05/31/2009) Creat: : 0.8 (05/31/2009)   Chol: 83 (05/31/2009)   HDL: 23.30 (05/31/2009)   LDL: 73 (02/17/2008)   TG: 204.0 (05/31/2009)  Problem # 5:  in addition to CPX we spent additional 15 min on his chronic medical problems  Complete Medication List: 1)  Lantus Solostar 100 Unit/ml Soln (Insulin glargine) .... 65-75 u at bedtime 2)  Lovastatin 40 Mg Tabs (Lovastatin) .... Take one tablet daily 3)  Adult Aspirin Ec Low Strength 81 Mg Tbec (Aspirin) 4)  Bd Ultra-fine Pen Needles 29g X 12.67mm Misc (Insulin pen needle) .... As directed 5)  Lisinopril-hydrochlorothiazide 20-25 Mg Tabs (Lisinopril-hydrochlorothiazide) .Marland Kitchen.. 1 by mouth once daily 6)  Glumetza 1000 Mg Xr24h-tab (Metformin hcl) .... Two tablets by mouth daily 7)  Accu-chek Aviva Strp (Glucose blood) .... Test three times a day  Other Orders: Admin 1st Vaccine (16109) Flu Vaccine 56yrs + (60454)  Patient Instructions: 1)  Please schedule a follow-up appointment in 6 months .  Prescriptions: GLUMETZA 1000 MG XR24H-TAB (METFORMIN HCL) two tablets by mouth daily  #180 x 3   Entered and Authorized by:   Elita Quick E. Laisha Rau MD   Signed by:   Nolon Rod. Contina Strain MD on 06/07/2009   Method used:   Print then Give to Patient   RxID:   [id number] GLUMETZA 1000 MG XR24H-TAB (METFORMIN HCL) two tablets  by mouth daily  #60 x 6   Entered and Authorized by:   Nolon Rod. Hannelore Bova MD   Signed by:   Nolon Rod. Jayland Null MD on 06/07/2009   Method used:   Reprint   RxID:   [id number] GLUMETZA 1000 MG XR24H-TAB (METFORMIN HCL) two tablets by mouth daily  #60 x 6   Entered and Authorized by:   Nolon Rod. Haedyn Breau MD   Signed by:   Nolon Rod. Lurleen Soltero MD on 06/07/2009   Method used:   Electronically to        [id number] (retail)       [address]       [place], [place]  [address]       Ph: [phone] or [phone]       Fax: [phone]   RxID:   [id number]    Family History:    CAD - F (MI 73), bro    HTN - F    DM - F    prostate Ca - bro    colon Ca - no    stroke - no    F - deceased MI    M- deceased brain tumor  Social History:    Married    1 children    [employer]     Never Smoked    Alcohol use-no    Drug use-no    exercise- active but not exercising regulalrly    diet-- healthy     Immunization History:  Tetanus/Td Immunization History:    Tetanus/Td:  Tdap (08/27/2007)  Influenza Immunization History:    Influenza:  Fluvax 3+ (06/07/2009)  Pneumovax Immunization History:    Pneumovax:  Pneumovax (02/17/2008)  Not Administered:    Influenza Vaccine not given due to: declined

## 2010-06-13 NOTE — Assessment & Plan Note (Signed)
Summary: 6 mo. f/u - jr   Vital Signs:  Patient profile:   65 year old male Weight:      161.38 pounds Pulse rate:   74 / minute Pulse rhythm:   regular BP sitting:   122 / 80  (left arm) Cuff size:   regular  Vitals Entered By: Army Fossa CMA (December 21, 2009 9:50 AM) CC: 6 month f/u: had juice this am   History of Present Illness:  routine office visit Doing great Endocrinology put him on victoza  and decreased Lantus dose. CBGs  are essentially normal, occasionally low sugars   (with symptoms).  Current Medications (verified): 1)  Lantus Solostar 100 Unit/ml Soln (Insulin Glargine) .... 25 U At Bedtime 2)  Lovastatin 40 Mg Tabs (Lovastatin) .... Take One Tablet Daily 3)  Adult Aspirin Ec Low Strength 81 Mg  Tbec (Aspirin) 4)  Bd Ultra-Fine Pen Needles 29g X 12.17mm Misc (Insulin Pen Needle) .... As Directed 5)  Lisinopril-Hydrochlorothiazide 20-25 Mg Tabs (Lisinopril-Hydrochlorothiazide) .Marland Kitchen.. 1 By Mouth Once Daily 6)  Glucophage 1000 Mg Tabs (Metformin Hcl) .... 2 By Mouth Daily 7)  Accu-Chek Aviva  Strp (Glucose Blood) .... Test Three Times A Day 8)  Victoza 18 Mg/75ml Soln (Liraglutide) .... 1.2 Daily  Allergies (verified): No Known Drug Allergies  Past History:  Past Medical History: Reviewed history from 06/07/2009 and no changes required. DM, UNCOMPLICATED, TYPE II, UNCONTROLLED  Dx aprox. 2000 HYPERLIPIDEMIA   HYPERTENSION   RENAL CELL CANCER 1992, has 1 kidney ; does not see urology any more ED  Past Surgical History: Reviewed history from 02/17/2008 and no changes required. Left Nephrectomy 1992  Social History: Reviewed history from 06/07/2009 and no changes required. Married 1 children [employer]  Never Smoked Alcohol use-no Drug use-no exercise- active but not exercising regulalrly diet-- healthy   Review of Systems       no nausea, vomiting, diarrhea Ambulatory BP is within normal limits Since he started vitoza has lost some  weight Good medication compliance with BP meds and cholesterol medication  Physical Exam  General:  alert, well-developed, and well-nourished.   Lungs:  normal respiratory effort, no intercostal retractions, no accessory muscle use, and normal breath sounds.    Heart:  normal rate, regular rhythm, no murmur, and no gallop.   Extremities:  no edema Psych:  not anxious appearing and not depressed appearing.     Impression & Recommendations:  Problem # 1:  HYPERLIPIDEMIA NEC/NOS (ICD-272.4) well-controlled, refills His updated medication list for this problem includes:    Lovastatin 40 Mg Tabs (Lovastatin) .Marland Kitchen... Take one tablet daily  Labs Reviewed: SGOT: 25 (05/31/2009)   SGPT: 48 (05/31/2009)   HDL:23.30 (05/31/2009), 23.70 (02/23/2009)  LDL:73 (02/17/2008), 72 (05/22/2007)  Chol:83 (05/31/2009), 138 (02/23/2009)  Trig:204.0 (05/31/2009), 247.0 (02/23/2009)  Problem # 2:  HYPERTENSION (ICD-401.9) well-controlled, refills His updated medication list for this problem includes:    Lisinopril-hydrochlorothiazide 20-25 Mg Tabs (Lisinopril-hydrochlorothiazide) .Marland Kitchen... 1 by mouth once daily  BP today: 122/80 Prior BP: 120/70 (06/07/2009)  Labs Reviewed: K+: 4.7 (05/31/2009) Creat: : 0.8 (05/31/2009)   Chol: 83 (05/31/2009)   HDL: 23.30 (05/31/2009)   LDL: 73 (02/17/2008)   TG: 204.0 (05/31/2009)  Problem # 3:  DM, UNCOMPLICATED, TYPE II, UNCONTROLLED (ICD-250.02) under excellent control Last note from endocrinology is reviewed, hemoglobin A1c was 5.9. BMP was normal Impression patient to call endocrinology if he has low sugar symptoms His updated medication list for this problem includes:    Lantus Solostar  100 Unit/ml Soln (Insulin glargine) .Marland Kitchen... 25 u at bedtime    Adult Aspirin Ec Low Strength 81 Mg Tbec (Aspirin)    Lisinopril-hydrochlorothiazide 20-25 Mg Tabs (Lisinopril-hydrochlorothiazide) .Marland Kitchen... 1 by mouth once daily    Metformin Hcl 1000 Mg Tabs (Metformin hcl) .Marland Kitchen... 2 by  mouth once daily    Victoza 18 Mg/23ml Soln (Liraglutide) .Marland Kitchen... 1.2 daily  Problem # 4:  ERECTILE DYSFUNCTION, ORGANIC (ICD-607.84) before he left, he requested a prescription for Viagra has tried  before without side effects A prescription was issued Side effects and how to use this medication was discussed with the patient.  His updated medication list for this problem includes:    Viagra 100 Mg Tabs (Sildenafil citrate) .Marland Kitchen... 1 by mouth once daily as neded  Complete Medication List: 1)  Lantus Solostar 100 Unit/ml Soln (Insulin glargine) .... 25 u at bedtime 2)  Lovastatin 40 Mg Tabs (Lovastatin) .... Take one tablet daily 3)  Adult Aspirin Ec Low Strength 81 Mg Tbec (Aspirin) 4)  Bd Ultra-fine Pen Needles 29g X 12.21mm Misc (Insulin pen needle) .... As directed 5)  Lisinopril-hydrochlorothiazide 20-25 Mg Tabs (Lisinopril-hydrochlorothiazide) .Marland Kitchen.. 1 by mouth once daily 6)  Metformin Hcl 1000 Mg Tabs (Metformin hcl) .... 2 by mouth once daily 7)  Accu-chek Aviva Strp (Glucose blood) .... Test three times a day 8)  Victoza 18 Mg/88ml Soln (Liraglutide) .... 1.2 daily 9)  Viagra 100 Mg Tabs (Sildenafil citrate) .Marland Kitchen.. 1 by mouth once daily as neded  Patient Instructions: 1)  Please schedule a follow-up appointment in 4 to 5  months, fasting for a physical exam Prescriptions: VIAGRA 100 MG TABS (SILDENAFIL CITRATE) 1 by mouth once daily as neded  #10 x 3   Entered and Authorized by:   Elita Quick E. Manasi Dishon MD   Signed by:   Nolon Rod. Mekenzie Modeste MD on 12/21/2009   Method used:   Print then Give to Patient   RxID:   [id number] LISINOPRIL-HYDROCHLOROTHIAZIDE 20-25 MG TABS (LISINOPRIL-HYDROCHLOROTHIAZIDE) 1 by mouth once daily  #90 x 3   Entered and Authorized by:   Nolon Rod. Miquel Stacks MD   Signed by:   Nolon Rod. Kerrianne Jeng MD on 12/21/2009   Method used:   Print then Give to Patient   RxID:   [id number] LOVASTATIN 40 MG TABS (LOVASTATIN) Take one tablet daily  #90 x 3   Entered and Authorized by:   Nolon Rod. Brainard Highfill  MD   Signed by:   Nolon Rod. Makhiya Coburn MD on 12/21/2009   Method used:   Print then Give to Patient   RxID:   [id number]

## 2010-06-13 NOTE — Progress Notes (Signed)
Summary: follow-up  Phone Note Outgoing Call   Summary of Call: Please follow-up on the previous phone note Iva Boop MD, [facility]  July 25, 2009 9:23 AM   Follow-up for Phone Call        Left message for patient to call back Darcey Nora RN, [facility]  July 25, 2009 9:38 AM  No further rectal bleeding .  No problems.  "Feeling great".     Follow-up by: Darcey Nora RN, CGRN,  July 27, 2009 3:23 PM

## 2010-06-14 ENCOUNTER — Encounter: Payer: Self-pay | Admitting: Internal Medicine

## 2010-06-14 ENCOUNTER — Encounter (INDEPENDENT_AMBULATORY_CARE_PROVIDER_SITE_OTHER): Payer: Self-pay | Admitting: *Deleted

## 2010-06-14 LAB — CONVERTED CEMR LAB: ALT: 21 units/L

## 2010-06-15 NOTE — Assessment & Plan Note (Signed)
Summary: TOO EARLY FOR A ------CPX/FASTING/KN   Vital Signs:  Patient profile:   65 year old male Height:      70 inches Weight:      164.13 pounds BMI:     23.64 Pulse rate:   75 / minute Pulse rhythm:   regular BP sitting:   134 / 86  (left arm) Cuff size:   regular  Vitals Entered By: Army Fossa CMA (April 26, 2010 8:47 AM) CC: CPX, not fasting  Comments [employer] [facility]   History of Present Illness: CPX   Preventive Screening-Counseling & Management  Alcohol-Tobacco     Alcohol type: beer  Current Medications (verified): 1)  Lantus Solostar 100 Unit/ml Soln (Insulin Glargine) .... 25 U At Bedtime 2)  Lovastatin 40 Mg Tabs (Lovastatin) .... Take One Tablet Daily 3)  Adult Aspirin Ec Low Strength 81 Mg  Tbec (Aspirin) 4)  Bd Ultra-Fine Pen Needles 29g X 12.5mm Misc (Insulin Pen Needle) .... As Directed 5)  Lisinopril-Hydrochlorothiazide 20-25 Mg Tabs (Lisinopril-Hydrochlorothiazide) .Marland Kitchen.. 1 By Mouth Once Daily 6)  Metformin Hcl 1000 Mg Tabs (Metformin Hcl) .... 2 By Mouth Once Daily 7)  Accu-Chek Aviva  Strp (Glucose Blood) .... Test Three Times A Day 8)  Victoza 18 Mg/28ml Soln (Liraglutide) .... 1.2 Daily  Allergies (verified): No Known Drug Allergies  Past History:  Past Medical History: Reviewed history from 06/07/2009 and no changes required. DM, UNCOMPLICATED, TYPE II, UNCONTROLLED  Dx aprox. 2000 HYPERLIPIDEMIA   HYPERTENSION   RENAL CELL CANCER 1992, has 1 kidney ; does not see urology any more ED  Past Surgical History: Reviewed history from 02/17/2008 and no changes required. Left Nephrectomy 1992   Impression & Recommendations:    Complete Medication List: 1)  Lantus Solostar 100 Unit/ml Soln (Insulin glargine) .... 25 u at bedtime 2)  Lovastatin 40 Mg Tabs (Lovastatin) .... Take one tablet daily 3)  Adult Aspirin Ec Low Strength 81 Mg Tbec (Aspirin) 4)  Bd Ultra-fine Pen Needles 29g X 12.65mm Misc (Insulin pen needle) .... As  directed 5)  Lisinopril-hydrochlorothiazide 20-25 Mg Tabs (Lisinopril-hydrochlorothiazide) .Marland Kitchen.. 1 by mouth once daily 6)  Metformin Hcl 1000 Mg Tabs (Metformin hcl) .... 2 by mouth once daily 7)  Accu-chek Aviva Strp (Glucose blood) .... Test three times a day 8)  Victoza 18 Mg/74ml Soln (Liraglutide) .... 1.2 daily  Other Orders: No Charge Patient Arrived (NCPA0) (NCPA0)   Orders Added: 1)  No Charge Patient Arrived (NCPA0) [NCPA0]     Risk Factors:  Alcohol use:  yes    Type:  beer

## 2010-07-05 NOTE — Letter (Signed)
Summary: [facility]  [facility]ocr[facility]ology [facility] Diabetes   Imported By: Maryln Gottron 06/20/2010 15:29:37  _____________________________________________________________________  External Attachment:    Type:   Image     Comment:   External Document

## 2010-07-12 ENCOUNTER — Encounter (INDEPENDENT_AMBULATORY_CARE_PROVIDER_SITE_OTHER): Payer: Managed Care, Other (non HMO) | Admitting: Internal Medicine

## 2010-07-12 ENCOUNTER — Encounter: Payer: Self-pay | Admitting: Internal Medicine

## 2010-07-12 ENCOUNTER — Other Ambulatory Visit: Payer: Self-pay | Admitting: Internal Medicine

## 2010-07-12 DIAGNOSIS — E785 Hyperlipidemia, unspecified: Secondary | ICD-10-CM

## 2010-07-12 DIAGNOSIS — Z0000 Encounter for general adult medical examination without abnormal findings: Secondary | ICD-10-CM

## 2010-07-12 DIAGNOSIS — Z23 Encounter for immunization: Secondary | ICD-10-CM

## 2010-07-12 DIAGNOSIS — Z2911 Encounter for prophylactic immunotherapy for respiratory syncytial virus (RSV): Secondary | ICD-10-CM

## 2010-07-12 DIAGNOSIS — Z125 Encounter for screening for malignant neoplasm of prostate: Secondary | ICD-10-CM

## 2010-07-12 LAB — CBC WITH DIFFERENTIAL/PLATELET
Basophils Absolute: 0 10*3/uL (ref 0.0–0.1)
Eosinophils Absolute: 0.4 10*3/uL (ref 0.0–0.7)
Eosinophils Relative: 7.7 % — ABNORMAL HIGH (ref 0.0–5.0)
HCT: 41 % (ref 39.0–52.0)
Lymphs Abs: 1.5 10*3/uL (ref 0.7–4.0)
MCV: 96.6 fl (ref 78.0–100.0)
Monocytes Absolute: 0.5 10*3/uL (ref 0.1–1.0)
Neutrophils Relative %: 57.9 % (ref 43.0–77.0)
Platelets: 226 10*3/uL (ref 150.0–400.0)
RDW: 13 % (ref 11.5–14.6)
WBC: 5.8 10*3/uL (ref 4.5–10.5)

## 2010-07-12 LAB — LIPID PANEL: Cholesterol: 85 mg/dL (ref 0–200)

## 2010-07-12 LAB — PSA: PSA: 0.71 ng/mL (ref 0.10–4.00)

## 2010-07-14 ENCOUNTER — Encounter (INDEPENDENT_AMBULATORY_CARE_PROVIDER_SITE_OTHER): Payer: Self-pay | Admitting: *Deleted

## 2010-07-20 NOTE — Assessment & Plan Note (Signed)
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 25 U At Bedtime 2)  Lovastatin 40 Mg Tabs (Lovastatin) .... Take One Tablet Daily 3)  Adult Aspirin Ec Low Strength 81 Mg  Tbec (Aspirin) 4)  Bd Ultra-Fine Pen Needles 29g X 12.81mm Misc (Insulin Pen Needle) .... As Directed 5)  Lisinopril-Hydrochlorothiazide 20-25 Mg Tabs (Lisinopril-Hydrochlorothiazide) .Marland Kitchen.. 1 By Mouth Once Daily 6)  Metformin Hcl 1000 Mg Tabs (Metformin Hcl) .... 2 By Mouth Once Daily 7)  Accu-Chek Aviva  Strp (Glucose Blood) .... Test Three Times A Day 8)  Victoza 18 Mg/34ml Soln (Liraglutide) .... 1.2 Daily  Allergies (verified): No Known Drug Allergies  Past History:  Past Medical History: Reviewed history from 06/07/2009 and no changes required. DM, UNCOMPLICATED, TYPE II, UNCONTROLLED  Dx aprox. 2000 HYPERLIPIDEMIA   HYPERTENSION   RENAL CELL CANCER 1992, has 1 kidney ; does not see urology any more ED  Past Surgical History: Reviewed history from 02/17/2008 and no changes required. 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 CV:  Denies chest pain or discomfort and swelling of feet. Resp:  Denies cough and shortness of breath. GI:  Denies bloody stools, nausea, and vomiting. GU:  Denies dysuria, hematuria, urinary frequency, and urinary hesitancy. Psych:  Denies anxiety and depression.  Physical Exam  General:  alert, well-developed, and well-nourished.   Neck:  supple, no masses, no thyromegaly, and no cervical lymphadenopathy.   Lungs:  normal respiratory effort, no intercostal retractions, no accessory muscle use, and normal breath sounds.   Heart:  normal rate, regular rhythm, no murmur, and no gallop.   Abdomen:  soft, non-tender, no distention, no masses, no guarding, and no rigidity.   Rectal:  No external abnormalities noted. Normal sphincter tone. No rectal masses or tenderness. Prostate:  Prostate gland firm and smooth, no enlargement, nodularity, tenderness, mass, asymmetry or induration. Extremities:  no edema Psych:  not anxious appearing and not depressed appearing.     Impression & Recommendations:  Problem # 1:  HEALTH SCREENING (ICD-V70.0) Td 2009 pneumonia shot 2009 shingles  immunizations discussed, he likes to go ahead. Done   colonoscopy 3-11, multiple polyps, next colonoscopy 2014   diet and exercise discussed  Labs from endocrinology 06-14-10:  BMP normal , LFTs normal, A1c 6.8   has a family history of heart disease, asymptomatic, EKG today  normal  sinus rhythm, not different from previous   he reports occasional hoarseness in the afternoon, not every day. He is a nonsmoker, does not drink. Recommend to call me if the symptoms are consistent, he will need ENT referral. Neck exam normal today.  Orders: Venipuncture (81191) TLB-CBC Platelet - w/Differential (85025-CBCD) TLB-PSA (Prostate Specific Antigen) (84153-PSA) Specimen Handling (47829) EKG w/ Interpretation  (93000)  Problem # 2:  DM, UNCOMPLICATED, TYPE II, UNCONTROLLED (ICD-250.02)  followup closely by endocrinology, last OV few days ago. Well controlled    His updated medication list for this problem includes:    Lantus Solostar 100 Unit/ml Soln (Insulin glargine) .Marland Kitchen... 25 u at bedtime    Adult Aspirin Ec Low Strength 81 Mg Tbec (Aspirin)    Lisinopril-hydrochlorothiazide 20-25 Mg Tabs (Lisinopril-hydrochlorothiazide) .Marland Kitchen... 1 by mouth once daily    Metformin Hcl 1000 Mg Tabs (Metformin hcl) .Marland Kitchen... 2 by mouth once daily    Victoza 18 Mg/75ml Soln (Liraglutide) .Marland Kitchen... 1.2 daily  Problem # 3:  HYPERTENSION (ICD-401.9) at goal  His updated medication list for this problem includes:    Lisinopril-hydrochlorothiazide 20-25 Mg Tabs (Lisinopril-hydrochlorothiazide) .Marland Kitchen... 1 by mouth once daily  BP today: 118/80 Prior BP: 134/86 (04/26/2010)  Labs Reviewed: K+: 4.7 (05/31/2009) Creat: : 0.8 (05/31/2009)   Chol: 83 (05/31/2009)   HDL: 23.30 (05/31/2009)   LDL: 73 (02/17/2008)   TG: 204.0 (05/31/2009)  Problem # 4:  HYPERLIPIDEMIA NEC/NOS (ICD-272.4) labs  His updated medication list for this problem includes:    Lovastatin 40 Mg Tabs (Lovastatin) .Marland Kitchen... Take one tablet daily  Orders: TLB-Lipid Panel (80061-LIPID) Specimen Handling (56213)  Labs Reviewed: SGOT: 25 (05/31/2009)   SGPT: 48 (05/31/2009)   HDL:23.30 (05/31/2009), 23.70 (02/23/2009)  LDL:73 (02/17/2008), 72 (05/22/2007)  Chol:83 (05/31/2009), 138 (02/23/2009)  Trig:204.0 (05/31/2009), 247.0 (02/23/2009)  Complete Medication List: 1)  Lantus Solostar 100 Unit/ml Soln (Insulin glargine) .... 25 u at bedtime 2)  Lovastatin 40 Mg Tabs (Lovastatin) .... Take one tablet daily 3)  Adult Aspirin Ec Low Strength 81 Mg Tbec (Aspirin) 4)  Bd Ultra-fine Pen Needles 29g X 12.53mm Misc (Insulin pen needle) .... As directed 5)  Lisinopril-hydrochlorothiazide 20-25 Mg Tabs (Lisinopril-hydrochlorothiazide) .Marland Kitchen.. 1 by mouth once daily 6)  Metformin  Hcl 1000 Mg Tabs (Metformin hcl) .... 2 by mouth once daily 7)  Accu-chek Aviva Strp (Glucose blood) .... Test three times a day 8)  Victoza 18 Mg/70ml Soln (Liraglutide) .... 1.2 daily  Other Orders: Zoster (Shingles) Vaccine Live [id number]) Admin 1st Vaccine (84696)  Patient Instructions: 1)    2)  Please schedule a follow-up appointment in 1 year.    Orders Added: 1)  Venipuncture [36415] 2)  TLB-CBC Platelet - w/Differential [85025-CBCD] 3)  TLB-PSA (Prostate Specific Antigen) [84153-PSA] 4)  TLB-Lipid Panel [80061-LIPID] 5)  Specimen Handling [99000] 6)  Zoster (Shingles) Vaccine Live [90736] 7)  Admin 1st Vaccine [90471] 8)  EKG w/ Interpretation [93000] 9)  Est. Patient age 44-64 [86]   Immunizations Administered:  Zostavax # 1:    Vaccine Type: Zostavax    Site: right deltoid    Mfr: Merck    Dose: 0.65 ml    Route: [place]    Given by: Army Fossa CMA    Exp. Date: 05/27/2011    Lot #: [id number]   Immunizations Administered:  Zostavax # 1:    Vaccine Type: Zostavax    Site: right deltoid    Mfr: Merck    Dose: 0.65 ml    Route: [place]  Given by: Army Fossa CMA    Exp. Date: 05/27/2011    Lot #: [id number]   Risk Factors:  Exercise:  yes    Type:  moderate

## 2010-07-20 NOTE — Miscellaneous (Signed)
Summary: lab from [place] endo  Clinical Lists Changes  Observations: Added new observation of SGPT (ALT): 21 units/L (06/14/2010 15:12) Added new observation of SGOT (AST): 31 units/L (06/14/2010 15:12) Added new observation of CALCIUM: 8.9 mg/dL (16/02/9603 54:09) Added new observation of HGBA1C: 6.8 % (06/14/2010 15:12) Added new observation of CREATININE: 0.7 mg/dL (81/19/1478 29:56)

## 2010-08-07 LAB — GLUCOSE, CAPILLARY
Glucose-Capillary: 122 mg/dL — ABNORMAL HIGH (ref 70–99)
Glucose-Capillary: 141 mg/dL — ABNORMAL HIGH (ref 70–99)

## 2010-12-26 ENCOUNTER — Other Ambulatory Visit: Payer: Self-pay | Admitting: *Deleted

## 2010-12-26 MED ORDER — LOVASTATIN 40 MG PO TABS: 40.0000 mg | ORAL_TABLET | Freq: Every day | ORAL | Status: DC

## 2010-12-26 MED ORDER — LISINOPRIL-HYDROCHLOROTHIAZIDE 20-25 MG PO TABS: 1.0000 | ORAL_TABLET | Freq: Every day | ORAL | Status: DC

## 2012-06-05 ENCOUNTER — Encounter: Payer: Managed Care, Other (non HMO) | Admitting: Internal Medicine

## 2012-06-05 ENCOUNTER — Encounter: Payer: Self-pay | Admitting: Internal Medicine

## 2012-06-05 ENCOUNTER — Ambulatory Visit (INDEPENDENT_AMBULATORY_CARE_PROVIDER_SITE_OTHER): Payer: BC Managed Care – PPO | Admitting: Internal Medicine

## 2012-06-05 VITALS — BP 134/86 | HR 83 | Temp 98.2°F | Ht 69.25 in | Wt 169.0 lb

## 2012-06-05 DIAGNOSIS — I10 Essential (primary) hypertension: Secondary | ICD-10-CM

## 2012-06-05 DIAGNOSIS — Z0000 Encounter for general adult medical examination without abnormal findings: Secondary | ICD-10-CM | POA: Insufficient documentation

## 2012-06-05 DIAGNOSIS — Z23 Encounter for immunization: Secondary | ICD-10-CM

## 2012-06-05 DIAGNOSIS — IMO0001 Reserved for inherently not codable concepts without codable children: Secondary | ICD-10-CM

## 2012-06-05 NOTE — Patient Instructions (Addendum)
Please come back fasting: FLP, CMP, CBC, TSH, PSA --- V70 Check the  blood pressure 2 or 3 times a week, be sure it is between 110/60 and 140/85. If it is consistently higher or lower, let me know Come back in one year for another complete physical exam

## 2012-06-05 NOTE — Progress Notes (Signed)
  Subjective:    Patient ID: Manuel Petty, male    DOB: 08-12-1945, 67 y.o.   MRN: [id number]  HPI Complete physical exam, last office visit about 2 years ago, he sees endocrinology routinely  Past Medical History  Diagnosis Date  . Diabetes mellitus 2,000  . HTN (hypertension)   . Hyperlipidemia   . Renal cancer     1992, has 1 kidney ; does not see urology any more  . Erectile dysfunction    Past Surgical History  Procedure Date  . Nephrectomy 1992   History   Social History  . Marital Status: Married    Spouse Name: N/A    Number of Children: 1  . Years of Education: N/A   Occupational History  . retired     Social History Main Topics  . Smoking status: Former Games developer  . Smokeless tobacco: Never Used     Comment: quit in 1992, 2 ppd  . Alcohol Use: Yes     Comment: beer sometimes  . Drug Use: Yes     Comment: marihuana sometimes   . Sexually Active: Not on file   Other Topics Concern  . Not on file   Social History Narrative   Diet:  Healthy  Most of the timeExercise: no recently    Family History  Problem Relation Age of Onset  . Diabetes Father   . Hypertension Father   . CAD Father     F (MI age 17), B  . Colon cancer Neg Hx   . Prostate cancer Brother     dx age 33  . Stroke Neg Hx     Review of Systems Denies chest pain or shortness or breath No nausea, vomiting, diarrhea. From time to time after a hard bowel movement, he sees drops of blood in the toilet paper, associated with some pain at the anus. Denies any cough or hoarseness. No anxiety or depression.     Objective:   Physical Exam  General -- alert, well-developed, and well-nourished.   Neck --no thyromegaly , normal carotid pulse Lungs -- normal respiratory effort, no intercostal retractions, no accessory muscle use, and normal breath sounds.   Heart-- normal rate, regular rhythm, no murmur, and no gallop.   Abdomen--soft, non-tender, no distention, no masses, no HSM, no  guarding, and no rigidity. No bruit  Extremities-- no pretibial edema bilaterally Rectal-- No external abnormalities noted. Normal sphincter tone. No rectal masses or tenderness. Brown stool, Hemoccult negative Prostate:  Prostate gland firm and smooth, no enlargement, nodularity, tenderness, mass, asymmetry or induration. Neurologic-- alert & oriented X3 and strength normal in all extremities. Psych-- Cognition and judgment appear intact. Alert and cooperative with normal attention span and concentration.  not anxious appearing and not depressed appearing.       Assessment & Plan:  Occasionally red blood per rectum, see review of systems, most likely a benign condition, last colonoscopy 3 years ago. Recommend to call if symptoms increase. Metamucil daily also recommended.

## 2012-06-05 NOTE — Assessment & Plan Note (Signed)
Followup by Dr. Lucianne Muss, he sees endocrinology routinely

## 2012-06-05 NOTE — Assessment & Plan Note (Signed)
BP well-controlled today, according to endocrinology notes BPs controlled

## 2012-06-05 NOTE — Assessment & Plan Note (Addendum)
Td 2009 pneumonia shot 2009 shingles  Immunization-- 2012 Flu shot unsure if he got it. He will let me know.   colonoscopy 3-11, multiple polyps, next colonoscopy 2014, we discussed a referral, declined at this point , needs to clarify his future insurance situation diet and exercise discussed  has a family history of heart disease, asymptomatic, EKG today  normal sinus rhythm, not different from previous Labs except for hemoglobin A1c. Will send results to Dr. Lucianne Muss

## 2012-06-06 ENCOUNTER — Other Ambulatory Visit (INDEPENDENT_AMBULATORY_CARE_PROVIDER_SITE_OTHER): Payer: BC Managed Care – PPO

## 2012-06-06 DIAGNOSIS — Z0000 Encounter for general adult medical examination without abnormal findings: Secondary | ICD-10-CM

## 2012-06-06 LAB — CBC WITH DIFFERENTIAL/PLATELET
Basophils Absolute: 0 10*3/uL (ref 0.0–0.1)
Hemoglobin: 15 g/dL (ref 13.0–17.0)
Lymphocytes Relative: 17.8 % (ref 12.0–46.0)
Monocytes Relative: 9.6 % (ref 3.0–12.0)
Neutro Abs: 4.9 10*3/uL (ref 1.4–7.7)
RDW: 12.9 % (ref 11.5–14.6)

## 2012-06-06 LAB — LIPID PANEL
Cholesterol: 111 mg/dL (ref 0–200)
Triglycerides: 111 mg/dL (ref 0.0–149.0)

## 2012-06-06 LAB — COMPREHENSIVE METABOLIC PANEL
ALT: 29 U/L (ref 0–53)
BUN: 15 mg/dL (ref 6–23)
CO2: 28 mEq/L (ref 19–32)
Calcium: 9.7 mg/dL (ref 8.4–10.5)
Chloride: 104 mEq/L (ref 96–112)
Creatinine, Ser: 0.8 mg/dL (ref 0.4–1.5)
GFR: 109.66 mL/min (ref >60.00)
Glucose, Bld: 129 mg/dL — ABNORMAL HIGH (ref 70–99)

## 2012-06-06 LAB — TSH: TSH: 1.06 u[IU]/mL (ref 0.35–5.50)

## 2012-06-10 ENCOUNTER — Encounter: Payer: Self-pay | Admitting: *Deleted

## 2012-08-23 ENCOUNTER — Encounter: Payer: Self-pay | Admitting: Internal Medicine

## 2012-08-26 ENCOUNTER — Encounter: Payer: Self-pay | Admitting: Internal Medicine

## 2012-11-21 ENCOUNTER — Other Ambulatory Visit: Payer: BC Managed Care – PPO

## 2012-11-25 ENCOUNTER — Ambulatory Visit: Payer: BC Managed Care – PPO | Admitting: Endocrinology

## 2012-12-01 ENCOUNTER — Other Ambulatory Visit: Payer: Self-pay | Admitting: *Deleted

## 2012-12-01 ENCOUNTER — Encounter: Payer: Self-pay | Admitting: Endocrinology

## 2012-12-01 ENCOUNTER — Other Ambulatory Visit (INDEPENDENT_AMBULATORY_CARE_PROVIDER_SITE_OTHER): Payer: 59

## 2012-12-01 ENCOUNTER — Ambulatory Visit (INDEPENDENT_AMBULATORY_CARE_PROVIDER_SITE_OTHER): Payer: 59 | Admitting: Endocrinology

## 2012-12-01 VITALS — BP 138/78 | HR 78 | Resp 12 | Ht 68.0 in | Wt 175.5 lb

## 2012-12-01 DIAGNOSIS — IMO0001 Reserved for inherently not codable concepts without codable children: Secondary | ICD-10-CM

## 2012-12-01 DIAGNOSIS — E785 Hyperlipidemia, unspecified: Secondary | ICD-10-CM

## 2012-12-01 DIAGNOSIS — I10 Essential (primary) hypertension: Secondary | ICD-10-CM

## 2012-12-01 LAB — URINALYSIS
Bilirubin Urine: NEGATIVE
Hgb urine dipstick: NEGATIVE
Ketones, ur: NEGATIVE
Leukocytes, UA: NEGATIVE
Specific Gravity, Urine: 1.025 (ref 1.000–1.030)
Urobilinogen, UA: 0.2 (ref 0.0–1.0)

## 2012-12-01 LAB — COMPREHENSIVE METABOLIC PANEL
Albumin: 4 g/dL (ref 3.5–5.2)
CO2: 27 mEq/L (ref 19–32)
Calcium: 9 mg/dL (ref 8.4–10.5)
GFR: 118.43 mL/min (ref >60.00)
Glucose, Bld: 142 mg/dL — ABNORMAL HIGH (ref 70–99)
Sodium: 139 mEq/L (ref 135–145)
Total Bilirubin: 0.5 mg/dL (ref 0.3–1.2)
Total Protein: 7 g/dL (ref 6.0–8.3)

## 2012-12-01 LAB — LIPID PANEL
HDL: 27 mg/dL — ABNORMAL LOW (ref >39.00)
LDL Cholesterol: 76 mg/dL (ref 0–99)
Total CHOL/HDL Ratio: 5
VLDL: 28.2 mg/dL (ref 0.0–40.0)

## 2012-12-01 LAB — HEMOGLOBIN A1C: Hgb A1c MFr Bld: 7.5 % — ABNORMAL HIGH (ref 4.6–6.5)

## 2012-12-01 MED ORDER — INSULIN LISPRO 100 UNIT/ML ~~LOC~~ SOLN: SUBCUTANEOUS | Status: DC

## 2012-12-01 NOTE — Patient Instructions (Addendum)
Reduce am Lantus to 28 units  Walk 30 min 4 times a week  Take at least 160 mg aspirin with Niacin

## 2012-12-01 NOTE — Progress Notes (Signed)
Patient ID: Manuel Petty, male   DOB: May 20, 1945, 67 y.o.   MRN: [id number]  Reason for Appointment: Diabetes follow-up   History of Present Illness   Diagnosis: Type 2 DIABETES MELITUS, date of diagnosis: 2007          Oral hypoglycemic drugs: Metformin        Side effects from medications: None Insulin regimen: Lantus 30 units in the morning and 18 at bedtime          Humalog before supper or large meals, usually 6 units Proper timing of medications in relation to meals: Yes.         Monitors blood glucose: Once a day.    Glucometer: One Touch.          Blood Glucose readings: Glucometer download reviewed: readings before breakfast: Recently 113- 127 but overall median 127 and highest 168, nonfasting 69-244 with most readings around lunchtime and rarely after supper, highest reading 244 at 6 PM Hypoglycemia frequency:  minimal with only one low blood sugar of 69 before lunch. He may feel blood sugar going low if late for lunch           Meals: 3 meals per day.          Physical activity: exercise: minimal, no walking program           Dietician visit: Most recent: 10/2009           Complications: are: None    The last HbgA1c was reported as 6.2    RECENT history: His blood sugars at home are fairly good although periodically high. This is usually related to eating higher carbohydrate meals or eating desserts. He has been quite compliant with his Victoza and basal insulin but not always using Humalog at supper  A1c previously usually under 7%.   He is taking relatively large amount of Lantus, previously was taking 28 units in the morning and only 6 in the evening and also has gained 4 pounds. This is even after asking him to reduce his morning dose on the last visit   HYPERLIPIDEMIA: His last LDL was 95 and HDL 27, apparently he was not taking lovastatin on his last visit but was advised niacin    Medication List       This list is accurate as of: 12/01/12 10:21 AM.  Always use your  most recent med list.               aspirin 81 MG tablet  Take 81 mg by mouth daily.     HUMALOG KWIKPEN 100 UNIT/ML injection  Generic drug:  insulin lispro  Inject 8 Units into the skin daily.     LANTUS SOLOSTAR 100 UNIT/ML injection  Generic drug:  insulin glargine  Inject 30 Units into the skin at bedtime. 30 units in am and 18 units at night     lisinopril 5 MG tablet  Commonly known as:  PRINIVIL,ZESTRIL  Take 5 mg by mouth daily.     lovastatin 40 MG tablet  Commonly known as:  MEVACOR  Take 1 tablet (40 mg total) by mouth daily.     metFORMIN 1000 MG tablet  Commonly known as:  GLUCOPHAGE  Take 1,000 mg by mouth 2 (two) times daily with a meal.     ONE TOUCH ULTRA TEST test strip  Generic drug:  glucose blood     VICTOZA 18 MG/3ML Soln injection  Generic drug:  Liraglutide  Inject 1.8 mg into  the skin daily.        Allergies: No Known Allergies  Past Medical History  Diagnosis Date  . Diabetes mellitus 2,000  . HTN (hypertension)   . Hyperlipidemia   . Renal cancer     1992, has 1 kidney ; does not see urology any more  . Erectile dysfunction   . Personal history of colonic adenomas 07/18/2009    Past Surgical History  Procedure Laterality Date  . Nephrectomy  1992    Family History  Problem Relation Age of Onset  . Diabetes Father   . Hypertension Father   . CAD Father     F (MI age 66), B  . Colon cancer Neg Hx   . Prostate cancer Brother     dx age 32  . Stroke Neg Hx     Social History:  reports that he has quit smoking. He has never used smokeless tobacco. He reports that  drinks alcohol. He reports that he uses illicit drugs.  Review of Systems -    He has only minimal hypertension History of dyslipidemia with HDL usually in the 20s, was told to use Slo-Niacin but he stopped it last week after hearing about a negative study with niacin no symptoms of neuropathy recently   Examination:   BP 138/78  Pulse 78  Resp 12  Ht 5'  8" (1.727 m)  Wt 175 lb 8 oz (79.606 kg)  BMI 26.69 kg/m2  SpO2 97%  Body mass index is 26.69 kg/(m^2).   Assesment/Plan:   Diabetes type 2, uncontrolled - 250.02  Lab Results  Component Value Date   HGBA1C 7.5* 12/01/2012    The patient's diabetes control appears to be overall fairly well controlled but he has periods of hyperglycemia with inconsistent diet. Also he is having relatively high A1c even though home readings are averaging only 136 Also he is not exercising and recommended that he start walking which he has not done His blood sugars are modestly increase in the morning but not consistent until recently; also has some postprandial hyperglycemia during the day. Not checking blood sugars much after supper which is his main meal He has been still able to use relatively low doses of insulin with adding Victoza but overall needing higher doses than a year ago Has tendency to low blood sugars before his late lunch and occasionally before supper also and discussed cutting back on morning Lantus by at least 2 years  Discussed need for overall better controlled especially with improved meal planning, avoiding hypoglycemia with reducing morning Lantus and need for weight loss   HYPERLIPIDEMIA: Low HDL has been in the 20s previously and he is concerned about the recent study not showing clinical benefit. However with his very low HDL and having small LDL particles he should benefit, discussed that the studies do not focus on patients with his profile He'll resume his niacin and take aspirin to prevent flushing   Counseling time over 50% of today's 25 minute visit  Manuel Petty 12/01/2012, 10:21 AM

## 2012-12-03 ENCOUNTER — Other Ambulatory Visit: Payer: Self-pay | Admitting: *Deleted

## 2012-12-03 MED ORDER — INSULIN LISPRO 100 UNIT/ML (KWIKPEN): 8.0000 [IU] | PEN_INJECTOR | Freq: Every day | SUBCUTANEOUS | Status: DC

## 2012-12-04 ENCOUNTER — Other Ambulatory Visit: Payer: Self-pay | Admitting: *Deleted

## 2012-12-09 ENCOUNTER — Other Ambulatory Visit: Payer: Self-pay | Admitting: *Deleted

## 2012-12-09 MED ORDER — LIRAGLUTIDE 18 MG/3ML ~~LOC~~ SOPN: 1.8000 mg | PEN_INJECTOR | Freq: Every day | SUBCUTANEOUS | Status: DC

## 2012-12-09 MED ORDER — INSULIN GLARGINE 100 UNIT/ML ~~LOC~~ SOLN: 30.0000 [IU] | Freq: Every day | SUBCUTANEOUS | Status: DC

## 2012-12-09 MED ORDER — INSULIN LISPRO 100 UNIT/ML (KWIKPEN): 8.0000 [IU] | PEN_INJECTOR | Freq: Every day | SUBCUTANEOUS | Status: DC

## 2012-12-18 ENCOUNTER — Telehealth: Payer: Self-pay | Admitting: Internal Medicine

## 2012-12-18 DIAGNOSIS — Z86010 Personal history of colonic polyps: Secondary | ICD-10-CM

## 2012-12-18 NOTE — Telephone Encounter (Signed)
Please call the patient, he is due for a colonoscopy, if he is ready to proceed  please arrange a GI referral.

## 2012-12-19 ENCOUNTER — Encounter: Payer: Self-pay | Admitting: Internal Medicine

## 2012-12-19 NOTE — Telephone Encounter (Signed)
discussed with patient, verbalized understanding. Agreed to proceed to have colonoscopy. orders placed.

## 2012-12-23 ENCOUNTER — Encounter: Payer: Self-pay | Admitting: Internal Medicine

## 2013-01-30 ENCOUNTER — Encounter: Payer: 59 | Admitting: Internal Medicine

## 2013-02-16 ENCOUNTER — Ambulatory Visit (AMBULATORY_SURGERY_CENTER): Payer: Self-pay

## 2013-02-16 VITALS — Ht 70.0 in | Wt 172.0 lb

## 2013-02-16 DIAGNOSIS — Z860100 Personal history of colon polyps, unspecified: Secondary | ICD-10-CM

## 2013-02-16 MED ORDER — SUPREP BOWEL PREP KIT 17.5-3.13-1.6 GM/177ML PO SOLN: 1.0000 | Freq: Once | ORAL | Status: DC

## 2013-02-17 ENCOUNTER — Encounter: Payer: Self-pay | Admitting: Internal Medicine

## 2013-03-03 ENCOUNTER — Encounter: Payer: 59 | Admitting: Internal Medicine

## 2013-03-09 ENCOUNTER — Ambulatory Visit: Payer: 59 | Admitting: Endocrinology

## 2013-03-19 ENCOUNTER — Encounter: Payer: Self-pay | Admitting: Endocrinology

## 2013-03-19 ENCOUNTER — Ambulatory Visit (INDEPENDENT_AMBULATORY_CARE_PROVIDER_SITE_OTHER): Payer: Medicare Other | Admitting: Endocrinology

## 2013-03-19 ENCOUNTER — Other Ambulatory Visit (INDEPENDENT_AMBULATORY_CARE_PROVIDER_SITE_OTHER): Payer: Medicare Other

## 2013-03-19 ENCOUNTER — Other Ambulatory Visit: Payer: Self-pay | Admitting: *Deleted

## 2013-03-19 VITALS — BP 166/88 | HR 74 | Temp 98.6°F | Resp 12 | Ht 70.0 in | Wt 173.1 lb

## 2013-03-19 DIAGNOSIS — IMO0001 Reserved for inherently not codable concepts without codable children: Secondary | ICD-10-CM

## 2013-03-19 DIAGNOSIS — E785 Hyperlipidemia, unspecified: Secondary | ICD-10-CM

## 2013-03-19 DIAGNOSIS — I10 Essential (primary) hypertension: Secondary | ICD-10-CM

## 2013-03-19 LAB — LIPID PANEL: Total CHOL/HDL Ratio: 5

## 2013-03-19 LAB — COMPREHENSIVE METABOLIC PANEL
AST: 27 U/L (ref 0–37)
Albumin: 4.1 g/dL (ref 3.5–5.2)
Alkaline Phosphatase: 80 U/L (ref 39–117)
BUN: 13 mg/dL (ref 6–23)
Creatinine, Ser: 0.7 mg/dL (ref 0.4–1.5)
Glucose, Bld: 117 mg/dL — ABNORMAL HIGH (ref 70–99)
Potassium: 4.5 mEq/L (ref 3.5–5.1)
Total Bilirubin: 0.4 mg/dL (ref 0.3–1.2)

## 2013-03-19 LAB — MICROALBUMIN / CREATININE URINE RATIO
Creatinine,U: 83.3 mg/dL
Microalb Creat Ratio: 4.6 mg/g (ref 0.0–30.0)
Microalb, Ur: 3.8 mg/dL — ABNORMAL HIGH (ref 0.0–1.9)

## 2013-03-19 LAB — URINALYSIS, ROUTINE W REFLEX MICROSCOPIC
Bilirubin Urine: NEGATIVE
Hgb urine dipstick: NEGATIVE
Nitrite: NEGATIVE
Total Protein, Urine: NEGATIVE
Urobilinogen, UA: 0.2 (ref 0.0–1.0)
pH: 6 (ref 5.0–8.0)

## 2013-03-19 LAB — HEMOGLOBIN A1C: Hgb A1c MFr Bld: 7 % — ABNORMAL HIGH (ref 4.6–6.5)

## 2013-03-19 MED ORDER — METFORMIN HCL 1000 MG PO TABS: 1000.0000 mg | ORAL_TABLET | Freq: Two times a day (BID) | ORAL | Status: DC

## 2013-03-19 MED ORDER — INSULIN LISPRO 100 UNIT/ML (KWIKPEN): 8.0000 [IU] | PEN_INJECTOR | Freq: Every day | SUBCUTANEOUS | Status: DC

## 2013-03-19 MED ORDER — INSULIN GLARGINE 100 UNIT/ML SOLOSTAR PEN: 30.0000 mg | PEN_INJECTOR | Freq: Every day | SUBCUTANEOUS | Status: DC

## 2013-03-19 MED ORDER — LIRAGLUTIDE 18 MG/3ML ~~LOC~~ SOPN: 1.8000 mg | PEN_INJECTOR | Freq: Every day | SUBCUTANEOUS | Status: DC

## 2013-03-19 MED ORDER — GLUCOSE BLOOD VI STRP: ORAL_STRIP | Status: DC

## 2013-03-19 NOTE — Patient Instructions (Addendum)
Lantus 26 units in the morning and 18 at bedtime.    Humalog before supper or large meals, usually 6 units REGARDLESS OF SUGAR LEVEL BEFORE SUPPER  Followup with PCP for blood pressure

## 2013-03-19 NOTE — Progress Notes (Signed)
Patient ID: Manuel Petty, male   DOB: 03-02-1946, 67 y.o.   MRN: [id number]  Reason for Appointment: Diabetes follow-up   History of Present Illness   Diagnosis: Type 2 DIABETES MELITUS, date of diagnosis: 2007    RECENT history: his blood sugar patterns show relatively good readings in the morning but tendency to low normal readings in the afternoons and high readings after supper He is not taking his Humalog at suppertime as directed and only when blood sugar may be higher before supper Previously had been instructed to take Humalog regularly especially for a higher carbohydrate or higher fat meals He has been  compliant with his Victoza and twice a day basal insulin He had been taking relatively large amounts of insulin prior to starting Victoza with which she was able to reduce his insulin dose significantly and lose weight. He is now taking relatively more dose of Lantus in the evening compared to previously when he was taking 6 units However with his current dose of 30 units in the morning he tends to be relatively low in the mid afternoon , lowest reading 59 especially when he is more active         Oral hypoglycemic drugs: Metformin        Side effects from medications: None Insulin regimen: Lantus 30 units in the morning and 18 at bedtime          Humalog before supper or large meals, usually 6 units Proper timing of medications in relation to meals: Yes.          Monitors blood glucose: Once a day.    Glucometer: One Touch.          Blood Glucose readings: Glucometer download reviewed: readings before breakfast: 71-166 with median about 100 Midafternoon 59-142 and late evening 132-217 Median reading at bedtime 188 Hypoglycemia frequency:  has had numerous 4 readings under 65 in the last 2 weeks, lowest reading 59 at 3:30 PM           Meals:2- 3 meals per day.          Physical activity: exercise: Walking around the house and gardening           Dietician visit: Most recent:  10/2009           Complications: are: None    The previous HbgA1c was reported as 7.5.  A1c previously usually under 7%, as low as 6.2.   HYPERLIPIDEMIA: His last LDL was 95 and HDL 27, apparently he was not taking lovastatin on his last visit but was advised niacin    Medication List       This list is accurate as of: 03/19/13 11:02 AM.  Always use your most recent med list.               aspirin 81 MG tablet  Take 81 mg by mouth daily.     insulin glargine 100 UNIT/ML injection  Commonly known as:  LANTUS  Inject 0.3 mLs (30 Units total) into the skin at bedtime. 30 units in am and 18 units at night, patient needs 90 day supply     insulin lispro 100 UNIT/ML Sopn  Commonly known as:  HUMALOG KWIKPEN  Inject 8 Units into the skin daily.     lisinopril 5 MG tablet  Commonly known as:  PRINIVIL,ZESTRIL  Take 5 mg by mouth daily.     metFORMIN 1000 MG tablet  Commonly known as:  GLUCOPHAGE  Take 1,000  mg by mouth 2 (two) times daily with a meal.     ONE TOUCH ULTRA TEST test strip  Generic drug:  glucose blood     SUPREP BOWEL PREP Soln  Generic drug:  Na Sulfate-K Sulfate-Mg Sulf  Take 1 kit by mouth once.     VICTOZA 18 MG/3ML Soln injection  Generic drug:  Liraglutide  Inject 1.8 mg into the skin daily.        Allergies: No Known Allergies  Past Medical History  Diagnosis Date  . Diabetes mellitus 2,000  . HTN (hypertension)   . Hyperlipidemia   . Renal cancer     1992, has 1 kidney ; does not see urology any more  . Erectile dysfunction   . Personal history of colonic adenomas 07/18/2009    Past Surgical History  Procedure Laterality Date  . Nephrectomy  1992    Family History  Problem Relation Age of Onset  . Diabetes Father   . Hypertension Father   . CAD Father     F (MI age 30), B  . Colon cancer Neg Hx   . Prostate cancer Brother     dx age 79  . Stroke Neg Hx     Social History:  reports that he has quit smoking. He has never used  smokeless tobacco. He reports that he drinks alcohol. He reports that he uses illicit drugs.  Review of Systems -    He has only minimal hypertension previously but blood pressure is higher today He thinks blood pressure is fairly good at home  History of dyslipidemia with HDL usually in the 20s, was told to use Slo-Niacin and this was resumed on his last visit Does not take aspirin for prophylaxis Eye exam due later this year, usually goes annually   Examination:   BP 166/88  Pulse 74  Temp(Src) 98.6 F (37 C)  Resp 12  Ht 5\' 10"  (1.778 m)  Wt 173 lb 1.6 oz (78.518 kg)  BMI 24.84 kg/m2  SpO2 97%  Body mass index is 24.84 kg/(m^2).   Assesment/Plan:   Diabetes type 2, uncontrolled - 250.02  Currently his blood sugar patterns show relatively good readings in the morning but tendency to lower readings in the afternoons and high readings after supper Since his A1c has been relatively high will need to control his postprandial hyperglycemia with mealtime coverage of insulin; currently not controlled after supper with his regimen of 1.8 mg Victoza  He does not understand the need to take HUMALOG before his evening meal especially when he is eating some carbohydrates Discussed that his blood sugars appear to go up significantly with his evening meal almost all the time and will need to take 6 units before supper unless eating a very low carbohydrate meal Has tendency to low blood sugars in the afternoon and occasionally before supper especially if he is more active. For this reason will reduce his morning Lantus by 4 units and encouraged him to continue being active during the day He will continue 18 units of Lantus in the evening since fasting readings are generally good   HYPERLIPIDEMIA: Low HDL has been in the 20s previously along with small LDL particles He is tolerating niacin even without aspirin and will have him continue this To have lipids checked again today    Hypertension: He will followup with PCP  [facility] 03/19/2013, 11:02 AM   ADDENDUM: Labs as follows, A1c 7%, HDL still low at 27  Appointment on 03/19/2013  Component Date Value Range Status  . Cholesterol 03/19/2013 127  0 - 200 mg/dL Final   ATP III Classification       Desirable:  < 200 mg/dL               Borderline High:  200 - 239 mg/dL          High:  > = 161 mg/dL  . Triglycerides 03/19/2013 100.0  0.0 - 149.0 mg/dL Final   Normal:  <096 mg/dLBorderline High:  150 - 199 mg/dL  . HDL 03/19/2013 27.10* >39.00 mg/dL Final  . VLDL 04/54/0981 20.0  0.0 - 40.0 mg/dL Final  . LDL Cholesterol 03/19/2013 80  0 - 99 mg/dL Final  . Total CHOL/HDL Ratio 03/19/2013 5   Final                  Men          Women1/2 Average Risk     3.4          3.3Average Risk          5.0          4.42X Average Risk          9.6          7.13X Average Risk          15.0          11.0                      . Sodium 03/19/2013 137  135 - 145 mEq/L Final  . Potassium 03/19/2013 4.5  3.5 - 5.1 mEq/L Final  . Chloride 03/19/2013 102  96 - 112 mEq/L Final  . CO2 03/19/2013 28  19 - 32 mEq/L Final  . Glucose, Bld 03/19/2013 117* 70 - 99 mg/dL Final  . BUN 19/14/7829 13  6 - 23 mg/dL Final  . Creatinine, Ser 03/19/2013 0.7  0.4 - 1.5 mg/dL Final  . Total Bilirubin 03/19/2013 0.4  0.3 - 1.2 mg/dL Final  . Alkaline Phosphatase 03/19/2013 80  39 - 117 U/L Final  . AST 03/19/2013 27  0 - 37 U/L Final  . ALT 03/19/2013 36  0 - 53 U/L Final  . Total Protein 03/19/2013 7.0  6.0 - 8.3 g/dL Final  . Albumin 56/21/3086 4.1  3.5 - 5.2 g/dL Final  . Calcium 57/84/6962 8.9  8.4 - 10.5 mg/dL Final  . GFR 95/28/4132 118.32  >60.00 mL/min Final  . Hemoglobin A1C 03/19/2013 7.0* 4.6 - 6.5 % Final   Glycemic Control Guidelines for People with Diabetes:Non Diabetic:  <6%Goal of Therapy: <7%Additional Action Suggested:  >8%   . Microalb, Ur 03/19/2013 3.8* 0.0 - 1.9 mg/dL Final  . Creatinine,U 44/05/270 83.3   Final  . Microalb  Creat Ratio 03/19/2013 4.6  0.0 - 30.0 mg/g Final  . Color, Urine 03/19/2013 LT. YELLOW  Yellow;Lt. Yellow Final  . APPearance 03/19/2013 CLEAR  Clear Final  . Specific Gravity, Urine 03/19/2013 1.020  1.000-1.030 Final  . pH 03/19/2013 6.0  5.0 - 8.0 Final  . Total Protein, Urine 03/19/2013 NEGATIVE  Negative Final  . Urine Glucose 03/19/2013 NEGATIVE  Negative Final  . Ketones, ur 03/19/2013 NEGATIVE  Negative Final  . Bilirubin Urine 03/19/2013 NEGATIVE  Negative Final  . Hgb urine dipstick 03/19/2013 NEGATIVE  Negative Final  . Urobilinogen, UA 03/19/2013 0.2  0.0 - 1.0 Final  . Leukocytes, UA 03/19/2013 NEGATIVE  Negative Final  .  Nitrite 03/19/2013 NEGATIVE  Negative Final

## 2013-03-24 ENCOUNTER — Other Ambulatory Visit: Payer: Self-pay | Admitting: *Deleted

## 2013-03-24 MED ORDER — LISINOPRIL 5 MG PO TABS: 5.0000 mg | ORAL_TABLET | Freq: Every day | ORAL | Status: DC

## 2013-03-26 ENCOUNTER — Other Ambulatory Visit: Payer: Self-pay | Admitting: *Deleted

## 2013-03-26 MED ORDER — ACCU-CHEK SOFTCLIX LANCET DEV MISC: Status: DC

## 2013-03-26 MED ORDER — ACCU-CHEK AVIVA PLUS W/DEVICE KIT: 1.0000 | PACK | Freq: Every day | Status: DC

## 2013-03-26 MED ORDER — ACCU-CHEK SOFTCLIX LANCET DEV KIT: 1.0000 | PACK | Freq: Every day | Status: DC

## 2013-06-25 ENCOUNTER — Other Ambulatory Visit: Payer: Medicare Other

## 2013-06-25 ENCOUNTER — Ambulatory Visit (INDEPENDENT_AMBULATORY_CARE_PROVIDER_SITE_OTHER): Payer: Medicare Other | Admitting: Endocrinology

## 2013-06-25 ENCOUNTER — Other Ambulatory Visit: Payer: Self-pay | Admitting: Endocrinology

## 2013-06-25 ENCOUNTER — Other Ambulatory Visit: Payer: Self-pay | Admitting: *Deleted

## 2013-06-25 ENCOUNTER — Encounter: Payer: Self-pay | Admitting: Endocrinology

## 2013-06-25 VITALS — BP 138/80 | HR 78 | Temp 98.3°F | Resp 16 | Ht 70.0 in | Wt 176.2 lb

## 2013-06-25 DIAGNOSIS — E785 Hyperlipidemia, unspecified: Secondary | ICD-10-CM

## 2013-06-25 DIAGNOSIS — IMO0001 Reserved for inherently not codable concepts without codable children: Secondary | ICD-10-CM

## 2013-06-25 DIAGNOSIS — E1165 Type 2 diabetes mellitus with hyperglycemia: Principal | ICD-10-CM

## 2013-06-25 LAB — COMPREHENSIVE METABOLIC PANEL
ALK PHOS: 104 U/L (ref 39–117)
ALT: 37 U/L (ref 0–53)
AST: 25 U/L (ref 0–37)
Albumin: 4.7 g/dL (ref 3.5–5.2)
BUN: 11 mg/dL (ref 6–23)
CALCIUM: 9.7 mg/dL (ref 8.4–10.5)
CHLORIDE: 103 meq/L (ref 96–112)
CO2: 25 mEq/L (ref 19–32)
CREATININE: 0.77 mg/dL (ref 0.50–1.35)
Glucose, Bld: 94 mg/dL (ref 70–99)
Potassium: 4.9 mEq/L (ref 3.5–5.3)
Sodium: 139 mEq/L (ref 135–145)
Total Bilirubin: 0.4 mg/dL (ref 0.2–1.2)
Total Protein: 7.3 g/dL (ref 6.0–8.3)

## 2013-06-25 LAB — HEMOGLOBIN A1C
Hgb A1c MFr Bld: 7 % — ABNORMAL HIGH (ref <5.7)
MEAN PLASMA GLUCOSE: 154 mg/dL — AB (ref <117)

## 2013-06-25 MED ORDER — INSULIN LISPRO 100 UNIT/ML (KWIKPEN): 8.0000 [IU] | PEN_INJECTOR | Freq: Every day | SUBCUTANEOUS | Status: DC

## 2013-06-25 MED ORDER — GLUCOSE BLOOD VI STRP: ORAL_STRIP | Status: DC

## 2013-06-25 NOTE — Progress Notes (Signed)
Patient ID: Manuel Petty, male   DOB: 02/21/1946, 68 y.o.   MRN: [id number]   Reason for Appointment: Diabetes follow-up   History of Present Illness   Diagnosis: Type 2 DIABETES MELITUS, date of diagnosis: 2007   Previous history: He had been on large doses of Lantus before he was given Victoza in addition and this improved his blood sugar control and reduced his insulin requirement However he subsequently started requiring mealtime coverage with large meals also  RECENT history: On his last visit he was advised to take Humalog consistently before evening meal unless eating a very light meal He still not doing this consistently and will have some high readings after evening meal. Also appears to have relatively high readings before dinner also Have having less hypoglycemia compared to last time LANTUS insulin: He is adjusting the evening dose based on his blood sugar at night rather than fasting reading although is checking late night drinks infrequently. Taking mostly 15 units; fasting readings are generally fairly good He has been  compliant with his Victoza and twice a day basal insulin         Oral hypoglycemic drugs: Metformin        Side effects from medications: None Insulin regimen: Lantus 26-28 units in the morning and 12-15 at bedtime          Humalog before  large meals, usually 6-8 units Proper timing of medications in relation to meals: Yes.          Monitors blood glucose: Once a day.    Glucometer: One Touch.          Blood Glucose readings: Glucometer download reviewed:   PREMEAL Breakfast Lunch Dinner Bedtime Overall  Glucose range:  59-193    72-203   169-255   130-252    Mean/median:  116   136     143    POST-MEAL PC Breakfast PC Lunch PC Dinner  Glucose range: 102-191   158, 222   Mean/median:      Hypoglycemia frequency:  rare, only one low reading waking up        Meals:2- 3 meals per day. Lunch 2-3 pm; dinner 6-7 pm        Physical activity:  exercise: Walking around the house         Dietician visit: Most recent: 12/2991           Complications: are: None     Wt Readings from Last 3 Encounters:  06/25/13 176 lb 3.2 oz (79.924 kg)  03/19/13 173 lb 1.6 oz (78.518 kg)  02/16/13 172 lb (78.019 kg)      Lab Results  Component Value Date   HGBA1C 7.0* 03/19/2013   HGBA1C 7.5* 12/01/2012   HGBA1C 6.8 06/14/2010   Lab Results  Component Value Date   MICROALBUR 3.8* 03/19/2013   LDLCALC 80 03/19/2013   CREATININE 0.7 03/19/2013     HYPERLIPIDEMIA: His last LDL was 95 and HDL 27 Previously taking lovastatin but now only on niacin with adequate control of LDL, has persistently low HDL  Lab Results  Component Value Date   CHOL 127 03/19/2013   HDL 27.10* 03/19/2013   LDLCALC 80 03/19/2013   LDLDIRECT 45.7 05/31/2009   TRIG 100.0 03/19/2013   CHOLHDL 5 03/19/2013      Medication List       This list is accurate as of: 06/25/13 10:13 AM.  Always use your most recent med list.  ACCU-CHEK AVIVA PLUS W/DEVICE Kit  1 each by Does not apply route daily.     ACCU-CHEK SOFTCLIX LANCET DEV Kit  1 each by Does not apply route daily.     accu-chek softclix lancets  Use as instructed     aspirin 81 MG tablet  Take 81 mg by mouth daily.     glucose blood test strip  Commonly known as:  ACCU-CHEK AVIVA PLUS  Use as instructed to check blood sugars 5 times per day dx code 250.02     Insulin Glargine 100 UNIT/ML Solostar Pen  Commonly known as:  LANTUS  Inject 40 Units into the skin daily. 28 in am and 12 in pm     insulin lispro 100 UNIT/ML KiwkPen  Commonly known as:  HUMALOG KWIKPEN  Inject 8 Units into the skin daily.     Liraglutide 18 MG/3ML Sopn  Commonly known as:  VICTOZA  Inject 1.8 mg into the skin daily.     lisinopril 5 MG tablet  Commonly known as:  PRINIVIL,ZESTRIL  Take 1 tablet (5 mg total) by mouth daily.     metFORMIN 1000 MG tablet  Commonly known as:  GLUCOPHAGE  Take 1 tablet (1,000  mg total) by mouth 2 (two) times daily with a meal.     SUPREP BOWEL PREP Soln  Generic drug:  Na Sulfate-K Sulfate-Mg Sulf  Take 1 kit by mouth once.        Allergies: No Known Allergies  Past Medical History  Diagnosis Date  . Diabetes mellitus 2,000  . HTN (hypertension)   . Hyperlipidemia   . Renal cancer     1992, has 1 kidney ; does not see urology any more  . Erectile dysfunction   . Personal history of colonic adenomas 07/18/2009    Past Surgical History  Procedure Laterality Date  . Nephrectomy  1992    Family History  Problem Relation Age of Onset  . Diabetes Father   . Hypertension Father   . CAD Father     F (MI age 65), B  . Colon cancer Neg Hx   . Prostate cancer Brother     dx age 74  . Stroke Neg Hx     Social History:  reports that he has quit smoking. He has never used smokeless tobacco. He reports that he drinks alcohol. He reports that he uses illicit drugs.  Review of Systems -    He has only minimal hypertension previously but blood pressure is  high normal  He thinks blood pressure is fairly good at home  Does not take aspirin for cardiovascular  prophylaxis  Eye exams: Annual   Examination:   BP 138/80  Pulse 78  Temp(Src) 98.3 F (36.8 C)  Resp 16  Ht _0  (1.778 m)  Wt 176 lb 3.2 oz (79.924 kg)  BMI 25.28 kg/m2  SpO2 97%  Body mass index is 25.28 kg/(m^2).   Assesment/Plan:   Diabetes type 2, uncontrolled  Blood sugar patterns:  Relatively good fasting blood sugars  Somewhat variable readings after breakfast and before lunch  Relatively high readings before supper and occasionally after supper   Recommendations made today:    Lantus 26  units in the morning and 15 at bedtime (no change unless am sugar <80 or >130). He was explained the need to keep the Lantus dose at night constant and not adjust it based on evening meal or evening blood sugar    Humalog before 4-6  units lunch meal daily, supper 6-8 units for  high carb meals and take consistent coverage for meals in the evening  Regular exercise   HYPERLIPIDEMIA: Low HDL has been in the 20s previously along with small LDL particles He is tolerating niacin 500 twice a day without aspirin and will have him continue this    [facility] 06/25/2013, 10:13 AM    Addendum: Labs show improved A1c   Orders Only on 06/25/2013  Component Date Value Ref Range Status  . Sodium 06/25/2013 139  135 - 145 mEq/L Final  . Potassium 06/25/2013 4.9  3.5 - 5.3 mEq/L Final  . Chloride 06/25/2013 103  96 - 112 mEq/L Final  . CO2 06/25/2013 25  19 - 32 mEq/L Final  . Glucose, Bld 06/25/2013 94  70 - 99 mg/dL Final  . BUN 06/25/2013 11  6 - 23 mg/dL Final  . Creat 06/25/2013 0.77  0.50 - 1.35 mg/dL Final  . Total Bilirubin 06/25/2013 0.4  0.2 - 1.2 mg/dL Final   ** Please note change in reference range(s). **  . Alkaline Phosphatase 06/25/2013 104  39 - 117 U/L Final  . AST 06/25/2013 25  0 - 37 U/L Final  . ALT 06/25/2013 37  0 - 53 U/L Final  . Total Protein 06/25/2013 7.3  6.0 - 8.3 g/dL Final  . Albumin 06/25/2013 4.7  3.5 - 5.2 g/dL Final  . Calcium 06/25/2013 9.7  8.4 - 10.5 mg/dL Final  . Hemoglobin A1C 06/25/2013 7.0* <5.7 % Final   Comment:                                                                                                 According to the ADA Clinical Practice Recommendations for 2011, when                          HbA1c is used as a screening test:                                                       >=6.5%   Diagnostic of Diabetes Mellitus                                     (if abnormal result is confirmed)                                                     5.7-6.4%   Increased risk of developing Diabetes Mellitus  References:Diagnosis and Classification of Diabetes Mellitus,Diabetes                          [id number],32(NVBTY 1):S62-S69 and Standards of Medical Care in                                   Diabetes - 2011,Diabetes [id number],97 (Suppl 1):S11-S61.                             . Mean Plasma Glucose 06/25/2013 154* <117 mg/dL Final

## 2013-06-25 NOTE — Patient Instructions (Addendum)
Lantus 26  units in the morning and 15 at bedtime (no change unless am sugar <80 or >130   Humalog before 4-6 units lunch meal daily, supper 6-8 units for hi carb meals

## 2013-09-22 ENCOUNTER — Ambulatory Visit (INDEPENDENT_AMBULATORY_CARE_PROVIDER_SITE_OTHER): Payer: Medicare Other | Admitting: Endocrinology

## 2013-09-22 ENCOUNTER — Encounter: Payer: Self-pay | Admitting: Endocrinology

## 2013-09-22 VITALS — BP 128/82 | HR 88 | Temp 98.3°F | Resp 14 | Ht 70.0 in | Wt 170.8 lb

## 2013-09-22 DIAGNOSIS — E1165 Type 2 diabetes mellitus with hyperglycemia: Principal | ICD-10-CM

## 2013-09-22 DIAGNOSIS — I10 Essential (primary) hypertension: Secondary | ICD-10-CM

## 2013-09-22 DIAGNOSIS — IMO0001 Reserved for inherently not codable concepts without codable children: Secondary | ICD-10-CM

## 2013-09-22 DIAGNOSIS — E785 Hyperlipidemia, unspecified: Secondary | ICD-10-CM

## 2013-09-22 LAB — COMPREHENSIVE METABOLIC PANEL
ALK PHOS: 84 U/L (ref 39–117)
ALT: 36 U/L (ref 0–53)
AST: 29 U/L (ref 0–37)
Albumin: 4.3 g/dL (ref 3.5–5.2)
BILIRUBIN TOTAL: 0.5 mg/dL (ref 0.2–1.2)
BUN: 13 mg/dL (ref 6–23)
CO2: 25 mEq/L (ref 19–32)
CREATININE: 0.8 mg/dL (ref 0.4–1.5)
Calcium: 8.8 mg/dL (ref 8.4–10.5)
Chloride: 105 mEq/L (ref 96–112)
GFR: 101.47 mL/min (ref >60.00)
Glucose, Bld: 129 mg/dL — ABNORMAL HIGH (ref 70–99)
Potassium: 4.8 mEq/L (ref 3.5–5.1)
Sodium: 140 mEq/L (ref 135–145)
Total Protein: 7.1 g/dL (ref 6.0–8.3)

## 2013-09-22 LAB — MICROALBUMIN / CREATININE URINE RATIO
CREATININE, U: 268 mg/dL
MICROALB/CREAT RATIO: 5.7 mg/g (ref 0.0–30.0)
Microalb, Ur: 15.3 mg/dL — ABNORMAL HIGH (ref 0.0–1.9)

## 2013-09-22 LAB — LIPID PANEL
Cholesterol: 117 mg/dL (ref 0–200)
HDL: 29.1 mg/dL — AB (ref >39.00)
LDL CALC: 62 mg/dL (ref 0–99)
Total CHOL/HDL Ratio: 4
Triglycerides: 128 mg/dL (ref 0.0–149.0)
VLDL: 25.6 mg/dL (ref 0.0–40.0)

## 2013-09-22 LAB — HEMOGLOBIN A1C: HEMOGLOBIN A1C: 6.8 % — AB (ref 4.6–6.5)

## 2013-09-22 NOTE — Patient Instructions (Signed)
Leave off Lantus in pm, start 6-8 units only am sugar stays > 130  Humalog for lunch as needed

## 2013-09-22 NOTE — Progress Notes (Signed)
Patient ID: Manuel Petty, male   DOB: Jan 28, 1946, 68 y.o.   MRN: [id number]   Reason for Appointment: Diabetes follow-up   History of Present Illness   Diagnosis: Type 2 DIABETES MELITUS, date of diagnosis: 2007   Previous history: He had been on large doses of Lantus before he was given Victoza in addition and this improved his blood sugar control and reduced his insulin requirement However he subsequently started requiring mealtime coverage with large meals also  RECENT history: On his last visit he was advised to take Humalog consistently before any large or high carbohydrate meals He still not doing this consistently and has high readings sporadically after every meal especially after supper and the highest overall average blood sugar is 167in the evening Also appears to have relatively high readings before dinner also probably from large meals at lunchtime LANTUS insulin: He is adjusting the evening dose again based on his blood sugar at night rather than fasting reading He will sometimes not take any insulin at all when blood sugar is near-normal and usually fasting blood sugars are not high;  May have a low normal readings periodically in the morning He has been  compliant with his Victoza and twice a day basal insulin         Oral hypoglycemic drugs: Metformin        Side effects from medications: None Insulin regimen: Lantus 26-28 units in the morning and 12-15 at bedtime    Humalog 0-4 acb, usually 6-8 units acs Proper timing of medications in relation to meals: Yes.          Monitors blood glucose: Once a day.    Glucometer: One Touch.          Blood Glucose readings: Glucometer download reviewed:   PREMEAL Breakfast Lunch Dinner Bedtime Overall  Glucose range   63-152   81-268   82-299   111-263  63-299   Mean/median:     137    Hypoglycemia: Only one reading of 63 on waking up        Meals:2- 3 meals per day. Lunch 2-3 pm; dinner 6-7 pm        Physical activity:  exercise:  some yard work Social research officer, government.       Dietician visit: Most recent: 08/6960           Complications: are: None     Wt Readings from Last 3 Encounters:  09/22/13 170 lb 12.8 oz (77.474 kg)  06/25/13 176 lb 3.2 oz (79.924 kg)  03/19/13 173 lb 1.6 oz (78.518 kg)      Lab Results  Component Value Date   HGBA1C 6.8* 09/22/2013   HGBA1C 7.0* 06/25/2013   HGBA1C 7.0* 03/19/2013   Lab Results  Component Value Date   MICROALBUR 15.3* 09/22/2013   LDLCALC 62 09/22/2013   CREATININE 0.8 09/22/2013     HYPERLIPIDEMIA:  Previously taking lovastatin but now only on niacin with adequate control of LDL  HDL has been in the 20s previously along with small LDL particles, to have an NMR panel today He is tolerating niacin 500 twice a day without aspirin   Lab Results  Component Value Date   CHOL 117 09/22/2013   HDL 29.10* 09/22/2013   LDLCALC 62 09/22/2013   LDLDIRECT 45.7 05/31/2009   TRIG 128.0 09/22/2013   CHOLHDL 4 09/22/2013      Medication List       This list is accurate as of: 09/22/13  9:02 PM.  Always use your most recent med list.               ACCU-CHEK AVIVA PLUS W/DEVICE Kit  1 each by Does not apply route daily.     ACCU-CHEK SOFTCLIX LANCET DEV Kit  1 each by Does not apply route daily.     accu-chek softclix lancets  Use as instructed     aspirin 81 MG tablet  Take 81 mg by mouth daily.     glucose blood test strip  Commonly known as:  ACCU-CHEK AVIVA PLUS  Use as instructed to check blood sugars 5 times per day dx code 250.02     Insulin Glargine 100 UNIT/ML Solostar Pen  Commonly known as:  LANTUS  Inject 40 Units into the skin daily. 28 in am and 12 in pm     insulin lispro 100 UNIT/ML KiwkPen  Commonly known as:  HUMALOG KWIKPEN  Inject 8 Units into the skin daily.     Liraglutide 18 MG/3ML Sopn  Commonly known as:  VICTOZA  Inject 1.8 mg into the skin daily.     lisinopril 5 MG tablet  Commonly known as:  PRINIVIL,ZESTRIL  Take 1 tablet (5 mg total)  by mouth daily.     metFORMIN 1000 MG tablet  Commonly known as:  GLUCOPHAGE  Take 1 tablet (1,000 mg total) by mouth 2 (two) times daily with a meal.     niacin 500 MG tablet  Commonly known as:  SLO-NIACIN  Take 500 mg by mouth 2 (two) times daily.     SUPREP BOWEL PREP Soln  Generic drug:  Na Sulfate-K Sulfate-Mg Sulf  Take 1 kit by mouth once.        Allergies: No Known Allergies  Past Medical History  Diagnosis Date  . Diabetes mellitus 2,000  . HTN (hypertension)   . Hyperlipidemia   . Renal cancer     1992, has 1 kidney ; does not see urology any more  . Erectile dysfunction   . Personal history of colonic adenomas 07/18/2009    Past Surgical History  Procedure Laterality Date  . Nephrectomy  1992    Family History  Problem Relation Age of Onset  . Diabetes Father   . Hypertension Father   . CAD Father     F (MI age 64), B  . Colon cancer Neg Hx   . Prostate cancer Brother     dx age 69  . Stroke Neg Hx     Social History:  reports that he has quit smoking. He has never used smokeless tobacco. He reports that he drinks alcohol. He reports that he uses illicit drugs.  Review of Systems -    He has only minimal hypertension previously and is only on 5 mg lisinopril good control He monitors BP at home  Does not take aspirin for cardiovascular  prophylaxis  Eye exams: Annual   Examination:   BP 128/82  Pulse 88  Temp(Src) 98.3 F (36.8 C)  Resp 14  Ht 5' 10"  (1.778 m)  Wt 170 lb 12.8 oz (77.474 kg)  BMI 24.51 kg/m2  SpO2 98%  Body mass index is 24.51 kg/(m^2).   Assesment/Plan:   Diabetes type 2, uncontrolled: He has periodic postprandial hyperglycemia as discussed in history of present illness This is mostly related to either inconsistent diet and excessive carbohydrates or portions He does not cover his meals consistently with insulin especially lunchtime  Blood sugar patterns:  Relatively good fasting blood  sugars with occasional low  normal readings  Somewhat variable readings after meals especially before and after supper  More significant variability in the evenings  Recommendations made today:   May try to watch blood sugar without taking any Lantus at night  May start smaller of Lantus at night if blood sugar is consistently over 130. Discussed need to adjust the dose based on fasting reading other than bedtime reading  Better control of carbohydrates and balanced meals    Humalog before 4-6 units lunch meal when eating significant amounts of carbohydrates, also thick consistently at supper   Regular exercise  Check microalbumin ratio today   HYPERLIPIDEMIA: Low HDL has been in the 20s previously along with small LDL particles, to have an NMR panel today He is tolerating niacin 500 twice a day without aspirin and will have him continue this    Elayne Snare 09/22/2013, 9:02 PM    Addendum: Labs show   Office Visit on 09/22/2013  Component Date Value Ref Range Status  . Hemoglobin A1C 09/22/2013 6.8* 4.6 - 6.5 % Final   Glycemic Control Guidelines for People with Diabetes:Non Diabetic:  <6%Goal of Therapy: <7%Additional Action Suggested:  >8%   . Sodium 09/22/2013 140  135 - 145 mEq/L Final  . Potassium 09/22/2013 4.8  3.5 - 5.1 mEq/L Final  . Chloride 09/22/2013 105  96 - 112 mEq/L Final  . CO2 09/22/2013 25  19 - 32 mEq/L Final  . Glucose, Bld 09/22/2013 129* 70 - 99 mg/dL Final  . BUN 09/22/2013 13  6 - 23 mg/dL Final  . Creatinine, Ser 09/22/2013 0.8  0.4 - 1.5 mg/dL Final  . Total Bilirubin 09/22/2013 0.5  0.2 - 1.2 mg/dL Final  . Alkaline Phosphatase 09/22/2013 84  39 - 117 U/L Final  . AST 09/22/2013 29  0 - 37 U/L Final  . ALT 09/22/2013 36  0 - 53 U/L Final  . Total Protein 09/22/2013 7.1  6.0 - 8.3 g/dL Final  . Albumin 09/22/2013 4.3  3.5 - 5.2 g/dL Final  . Calcium 09/22/2013 8.8  8.4 - 10.5 mg/dL Final  . GFR 09/22/2013 101.47  >60.00 mL/min Final  . Cholesterol 09/22/2013 117  0 - 200  mg/dL Final   ATP III Classification       Desirable:  < 200 mg/dL               Borderline High:  200 - 239 mg/dL          High:  > = 240 mg/dL  . Triglycerides 09/22/2013 128.0  0.0 - 149.0 mg/dL Final   Normal:  <150 mg/dLBorderline High:  150 - 199 mg/dL  . HDL 09/22/2013 29.10* >39.00 mg/dL Final  . VLDL 09/22/2013 25.6  0.0 - 40.0 mg/dL Final  . LDL Cholesterol 09/22/2013 62  0 - 99 mg/dL Final  . Total CHOL/HDL Ratio 09/22/2013 4   Final                  Men          Women1/2 Average Risk     3.4          3.3Average Risk          5.0          4.42X Average Risk          9.6          7.13X Average Risk          15.0  11.0                      . Microalb, Ur 09/22/2013 15.3* 0.0 - 1.9 mg/dL Final  . Creatinine,U 09/22/2013 268.0   Final  . Microalb Creat Ratio 09/22/2013 5.7  0.0 - 30.0 mg/g Final

## 2013-09-23 LAB — LIPOPROTEIN ANALYSIS BY NMR
HDL Particle Number: 20.9 umol/L — ABNORMAL LOW (ref >=30.5)
LDL Particle Number: 929 nmol/L (ref <1000)
LDL Size: 20.5 nm (ref >20.5)
LP-IR Score: 75 — ABNORMAL HIGH (ref <=45)
Small LDL Particle Number: 388 nmol/L (ref <=527)

## 2013-10-12 ENCOUNTER — Other Ambulatory Visit: Payer: Self-pay | Admitting: Endocrinology

## 2013-10-12 ENCOUNTER — Encounter: Payer: Self-pay | Admitting: Endocrinology

## 2013-10-12 MED ORDER — INSULIN GLARGINE 100 UNIT/ML SOLOSTAR PEN: 40.0000 [IU] | PEN_INJECTOR | Freq: Every day | SUBCUTANEOUS | Status: DC

## 2013-10-16 ENCOUNTER — Telehealth: Payer: Self-pay

## 2013-10-16 NOTE — Telephone Encounter (Signed)
Medication List and allergies:  Reviewed and updated  90 day supply/mail order: [facility] Local prescriptions: [facility] [address]   A/P:   No changes to FH, PSH or Personal Hx  Td 2009  pneumonia shot 2009  shingles Immunization-- 2012  Flu shot unsure if he got it. He will let me know.  colonoscopy 3-11, multiple polyps, next colonoscopy 2014, we discussed a referral, declined at this point , needs to clarify his future insurance situation  diet and exercise discussed  has a family history of heart disease, asymptomatic, EKG today normal sinus rhythm, not different from previous   To Discuss with Provider: Not at this time Recent labs with Dr Dwyane Dee in chart

## 2013-10-19 ENCOUNTER — Encounter: Payer: Self-pay | Admitting: Internal Medicine

## 2013-10-19 ENCOUNTER — Ambulatory Visit (INDEPENDENT_AMBULATORY_CARE_PROVIDER_SITE_OTHER): Payer: Medicare Other | Admitting: Internal Medicine

## 2013-10-19 VITALS — BP 148/74 | HR 66 | Temp 98.2°F | Ht 68.8 in | Wt 168.0 lb

## 2013-10-19 DIAGNOSIS — E785 Hyperlipidemia, unspecified: Secondary | ICD-10-CM

## 2013-10-19 DIAGNOSIS — I10 Essential (primary) hypertension: Secondary | ICD-10-CM

## 2013-10-19 DIAGNOSIS — E1165 Type 2 diabetes mellitus with hyperglycemia: Secondary | ICD-10-CM

## 2013-10-19 DIAGNOSIS — Z23 Encounter for immunization: Secondary | ICD-10-CM

## 2013-10-19 DIAGNOSIS — R972 Elevated prostate specific antigen [PSA]: Secondary | ICD-10-CM

## 2013-10-19 DIAGNOSIS — IMO0001 Reserved for inherently not codable concepts without codable children: Secondary | ICD-10-CM

## 2013-10-19 DIAGNOSIS — Z0000 Encounter for general adult medical examination without abnormal findings: Secondary | ICD-10-CM

## 2013-10-19 LAB — CBC WITH DIFFERENTIAL/PLATELET
BASOS ABS: 0 10*3/uL (ref 0.0–0.1)
BASOS PCT: 0.5 % (ref 0.0–3.0)
Eosinophils Absolute: 0.4 10*3/uL (ref 0.0–0.7)
Eosinophils Relative: 7.5 % — ABNORMAL HIGH (ref 0.0–5.0)
HCT: 43 % (ref 39.0–52.0)
HEMOGLOBIN: 14.4 g/dL (ref 13.0–17.0)
LYMPHS PCT: 24.4 % (ref 12.0–46.0)
Lymphs Abs: 1.4 10*3/uL (ref 0.7–4.0)
MCHC: 33.5 g/dL (ref 30.0–36.0)
MCV: 97.6 fl (ref 78.0–100.0)
MONO ABS: 0.6 10*3/uL (ref 0.1–1.0)
Monocytes Relative: 10.5 % (ref 3.0–12.0)
NEUTROS ABS: 3.3 10*3/uL (ref 1.4–7.7)
NEUTROS PCT: 57.1 % (ref 43.0–77.0)
Platelets: 258 10*3/uL (ref 150.0–400.0)
RBC: 4.41 Mil/uL (ref 4.22–5.81)
RDW: 13.9 % (ref 11.5–15.5)
WBC: 5.7 10*3/uL (ref 4.0–10.5)

## 2013-10-19 LAB — PSA: PSA: 1.3 ng/mL (ref 0.10–4.00)

## 2013-10-19 NOTE — Assessment & Plan Note (Addendum)
Td 2009 pneumonia shot 2009 prevnar today shingles  Immunization-- 2012   colonoscopy 3-11, multiple polyps, was due for a colonoscopy 2014, had to cancel the procedure last year d/t family issues, states he will call and get it set up  Recent  labs reviewed, due for a CBC. Slightly increased PSA velocity? Check a PSA today  diet and exercise discussed

## 2013-10-19 NOTE — Assessment & Plan Note (Signed)
Last cholesterol is satisfactory

## 2013-10-19 NOTE — Assessment & Plan Note (Signed)
Currently well controlled under the care of Dr. Dwyane Dee. He denies any symptoms consistent with neuropathy. Also recommend regular eye checks

## 2013-10-19 NOTE — Progress Notes (Signed)
Subjective:    Patient ID: Manuel Petty, male    DOB: 09-13-1945, 68 y.o.   MRN: [id number]  DOS:  10/19/2013 Type of  Visit:  Here for Medicare AWV:  1. Risk factors based on Past M, S, F history: reviewed 2. Physical Activities:  + routine, x5/ week 3. Depression/mood: neg screening  4. Hearing:  No problemss noted or reported  5. ADL's:  independent 6. Fall Risk: low, counseled  7. home Safety: does feel safe at home  8. Height, weight, & visual acuity: see VS, sees eye doctor, due for a visit, rec to call and schedule 9. Counseling: provided 10. Labs ordered based on risk factors: if needed  11. Referral Coordination: if needed 12. Care Plan, see assessment and plan  13. Cognitive Assessment: cognition-motor skills wnl   In addition, today we discussed the following:  DM, under th  care of endocrinology, last A1c 6.8 Hypertension, ambulatory BPs are usually lower than today. Good medication compliance with all meds   ROS No  CP, SOB No palpitations, no lower extremity edema Denies  nausea, vomiting or  blood in the stools; occ diarrhea (thinks related to meds) No abdominal pain No GERD  Sx. (-) cough, sputum production (-) wheezing, chest congestion (-)hemoptysis No dysuria, gross hematuria, difficulty urinating    No headaches. Denies dizziness    Past Medical History  Diagnosis Date  . Diabetes mellitus 2,000  . HTN (hypertension)   . Hyperlipidemia   . Renal cancer     1992, has 1 kidney ; does not see urology any more  . Erectile dysfunction   . Personal history of colonic adenomas 07/18/2009    Past Surgical History  Procedure Laterality Date  . Nephrectomy  1992    History   Social History  . Marital Status: Married    Spouse Name: N/A    Number of Children: 1  . Years of Education: N/A   Occupational History  . retired     Social History Main Topics  . Smoking status: Former Research scientist (life sciences)  . Smokeless tobacco: Never Used     Comment:  quit in 1992, 2 ppd  . Alcohol Use: Yes     Comment: beer sometimes  . Drug Use: Yes     Comment: marihuana sometimes   . Sexual Activity: Not on file   Other Topics Concern  . Not on file   Social History Narrative   Diet:  Healthy  Most of the time   Exercise: no recently      Family History  Problem Relation Age of Onset  . Diabetes Father   . Hypertension Father   . CAD Father     F (MI age 6), Brother in [place] 41  . Colon cancer Neg Hx   . Prostate cancer Brother     dx age 11  . Stroke Neg Hx        Medication List       This list is accurate as of: 10/19/13 10:18 AM.  Always use your most recent med list.               ACCU-CHEK AVIVA PLUS W/DEVICE Kit  1 each by Does not apply route daily.     ACCU-CHEK SOFTCLIX LANCET DEV Kit  1 each by Does not apply route daily.     accu-chek softclix lancets  Use as instructed     aspirin 81 MG tablet  Take 81 mg by mouth daily.  glucose blood test strip  Commonly known as:  ACCU-CHEK AVIVA PLUS  Use as instructed to check blood sugars 5 times per day dx code 250.02     Insulin Glargine 100 UNIT/ML Solostar Pen  Commonly known as:  LANTUS  Inject 40 Units into the skin daily. 28 in am and 12 in pm     insulin lispro 100 UNIT/ML KiwkPen  Commonly known as:  HUMALOG KWIKPEN  Inject 8 Units into the skin daily.     Liraglutide 18 MG/3ML Sopn  Commonly known as:  VICTOZA  Inject 1.8 mg into the skin daily.     lisinopril 5 MG tablet  Commonly known as:  PRINIVIL,ZESTRIL  Take 1 tablet (5 mg total) by mouth daily.     metFORMIN 1000 MG tablet  Commonly known as:  GLUCOPHAGE  Take 1 tablet (1,000 mg total) by mouth 2 (two) times daily with a meal.     niacin 500 MG tablet  Commonly known as:  SLO-NIACIN  Take 500 mg by mouth 2 (two) times daily.     SUPREP BOWEL PREP Soln  Generic drug:  Na Sulfate-K Sulfate-Mg Sulf  Take 1 kit by mouth once.           Objective:   Physical Exam BP 148/74   Pulse 66  Temp(Src) 98.2 F (36.8 C)  Ht 5' 8.8" (1.748 m)  Wt 168 lb (76.204 kg)  BMI 24.94 kg/m2  SpO2 99% General -- alert, well-developed, NAD.  Neck --no thyromegaly  HEENT-- Not pale.  Lungs -- normal respiratory effort, no intercostal retractions, no accessory muscle use, and normal breath sounds.  Heart-- normal rate, regular rhythm, no murmur.  Abdomen-- Not distended, good bowel sounds,soft, non-tender. No rebound or rigidity. No mass,organomegaly. Rectal-- No external abnormalities noted. Normal sphincter tone. No rectal masses or tenderness. Stool brown, Hemoccult negative  Prostate--Prostate gland firm and smooth, no enlargement, nodularity, tenderness, mass, asymmetry or induration. Extremities-- no pretibial edema bilaterally  Neurologic--  alert & oriented X3. Speech normal, gait appropriate for age, strength symmetric and appropriate for age.   Psych-- Cognition and judgment appear intact. Cooperative with normal attention span and concentration. No anxious or depressed appearing.         Assessment & Plan:

## 2013-10-19 NOTE — Patient Instructions (Signed)
Get your blood work before you leave   Check the  blood pressure 2 or 3 times a month   be sure it is between 110/60 and 140/85. Ideal blood pressure is 120/80. If it is consistently higher or lower, let me know  Next visit is for a physical exam in 1 year  fasting Please make an appointment       Fall Prevention and Home Safety Falls cause injuries and can affect all age groups. It is possible to use preventive measures to significantly decrease the likelihood of falls. There are many simple measures which can make your home safer and prevent falls. OUTDOORS  Repair cracks and edges of walkways and driveways.  Remove high doorway thresholds.  Trim shrubbery on the main path into your home.  Have good outside lighting.  Clear walkways of tools, rocks, debris, and clutter.  Check that handrails are not broken and are securely fastened. Both sides of steps should have handrails.  Have leaves, snow, and ice cleared regularly.  Use sand or salt on walkways during winter months.  In the garage, clean up grease or oil spills. BATHROOM  Install night lights.  Install grab bars by the toilet and in the tub and shower.  Use non-skid mats or decals in the tub or shower.  Place a plastic non-slip stool in the shower to sit on, if needed.  Keep floors dry and clean up all water on the floor immediately.  Remove soap buildup in the tub or shower on a regular basis.  Secure bath mats with non-slip, double-sided rug tape.  Remove throw rugs and tripping hazards from the floors. BEDROOMS  Install night lights.  Make sure a bedside light is easy to reach.  Do not use oversized bedding.  Keep a telephone by your bedside.  Have a firm chair with side arms to use for getting dressed.  Remove throw rugs and tripping hazards from the floor. KITCHEN  Keep handles on pots and pans turned toward the center of the stove. Use back burners when possible.  Clean up spills quickly  and allow time for drying.  Avoid walking on wet floors.  Avoid hot utensils and knives.  Position shelves so they are not too high or low.  Place commonly used objects within easy reach.  If necessary, use a sturdy step stool with a grab bar when reaching.  Keep electrical cables out of the way.  Do not use floor polish or wax that makes floors slippery. If you must use wax, use non-skid floor wax.  Remove throw rugs and tripping hazards from the floor. STAIRWAYS  Never leave objects on stairs.  Place handrails on both sides of stairways and use them. Fix any loose handrails. Make sure handrails on both sides of the stairways are as long as the stairs.  Check carpeting to make sure it is firmly attached along stairs. Make repairs to worn or loose carpet promptly.  Avoid placing throw rugs at the top or bottom of stairways, or properly secure the rug with carpet tape to prevent slippage. Get rid of throw rugs, if possible.  Have an electrician put in a light switch at the top and bottom of the stairs. OTHER FALL PREVENTION TIPS  Wear low-heel or rubber-soled shoes that are supportive and fit well. Wear closed toe shoes.  When using a stepladder, make sure it is fully opened and both spreaders are firmly locked. Do not climb a closed stepladder.  Add color or  contrast paint or tape to grab bars and handrails in your home. Place contrasting color strips on first and last steps.  Learn and use mobility aids as needed. Install an electrical emergency response system.  Turn on lights to avoid dark areas. Replace light bulbs that burn out immediately. Get light switches that glow.  Arrange furniture to create clear pathways. Keep furniture in the same place.  Firmly attach carpet with non-skid or double-sided tape.  Eliminate uneven floor surfaces.  Select a carpet pattern that does not visually hide the edge of steps.  Be aware of all pets. OTHER HOME SAFETY TIPS  Set  the water temperature for 120 F (48.8 C).  Keep emergency numbers on or near the telephone.  Keep smoke detectors on every level of the home and near sleeping areas. Document Released: 04/20/2002 Document Revised: 10/30/2011 Document Reviewed: 07/20/2011 [facility] Patient Information 2014 [place].

## 2013-10-19 NOTE — Assessment & Plan Note (Signed)
Controlled in the amb setting, rec to monitor BPs, recent BMP wnl

## 2013-10-20 ENCOUNTER — Telehealth: Payer: Self-pay | Admitting: Internal Medicine

## 2013-10-20 NOTE — Telephone Encounter (Signed)
Relevant patient education assigned to patient using Emmi. ° °

## 2013-12-24 ENCOUNTER — Encounter: Payer: Self-pay | Admitting: Endocrinology

## 2013-12-24 ENCOUNTER — Other Ambulatory Visit: Payer: Self-pay | Admitting: *Deleted

## 2013-12-24 ENCOUNTER — Ambulatory Visit (INDEPENDENT_AMBULATORY_CARE_PROVIDER_SITE_OTHER): Payer: Medicare Other | Admitting: Endocrinology

## 2013-12-24 VITALS — BP 142/77 | HR 69 | Temp 98.3°F | Resp 16 | Ht 70.0 in | Wt 166.8 lb

## 2013-12-24 DIAGNOSIS — IMO0001 Reserved for inherently not codable concepts without codable children: Secondary | ICD-10-CM

## 2013-12-24 DIAGNOSIS — E875 Hyperkalemia: Secondary | ICD-10-CM

## 2013-12-24 DIAGNOSIS — E785 Hyperlipidemia, unspecified: Secondary | ICD-10-CM

## 2013-12-24 DIAGNOSIS — E1165 Type 2 diabetes mellitus with hyperglycemia: Principal | ICD-10-CM

## 2013-12-24 DIAGNOSIS — I10 Essential (primary) hypertension: Secondary | ICD-10-CM

## 2013-12-24 LAB — BASIC METABOLIC PANEL
BUN: 10 mg/dL (ref 6–23)
CO2: 25 mEq/L (ref 19–32)
Calcium: 8.9 mg/dL (ref 8.4–10.5)
Chloride: 104 mEq/L (ref 96–112)
Creatinine, Ser: 0.7 mg/dL (ref 0.4–1.5)
GFR: 124.07 mL/min (ref >60.00)
Glucose, Bld: 165 mg/dL — ABNORMAL HIGH (ref 70–99)
POTASSIUM: 5.3 meq/L — AB (ref 3.5–5.1)
Sodium: 136 mEq/L (ref 135–145)

## 2013-12-24 LAB — LIPID PANEL
CHOLESTEROL: 128 mg/dL (ref 0–200)
HDL: 25.1 mg/dL — ABNORMAL LOW (ref >39.00)
LDL Cholesterol: 83 mg/dL (ref 0–99)
NONHDL: 102.9
Total CHOL/HDL Ratio: 5
Triglycerides: 99 mg/dL (ref 0.0–149.0)
VLDL: 19.8 mg/dL (ref 0.0–40.0)

## 2013-12-24 LAB — HEMOGLOBIN A1C: Hgb A1c MFr Bld: 7 % — ABNORMAL HIGH (ref 4.6–6.5)

## 2013-12-24 MED ORDER — INSULIN GLARGINE 100 UNIT/ML SOLOSTAR PEN: 40.0000 [IU] | PEN_INJECTOR | Freq: Every day | SUBCUTANEOUS | Status: DC

## 2013-12-24 NOTE — Patient Instructions (Signed)
Must take Humalog with cereal, bread and every pm meal, at least 3-4 units  No Lantus at night

## 2013-12-24 NOTE — Progress Notes (Signed)
Patient ID: Manuel Petty, male   DOB: April 30, 1946, 68 y.o.   MRN: [id number]   Reason for Appointment: Diabetes follow-up   History of Present Illness   Diagnosis: Type 2 DIABETES MELITUS, date of diagnosis: 2007   Previous history: He had been on large doses of Lantus before he was given Victoza in addition and this improved his blood sugar control and reduced his insulin requirement However he subsequently started requiring mealtime coverage with large meals also  RECENT history: Although his blood sugars are overall reasonably well-controlled at home he is not understanding the instructions and actions of his Humalog and Lantus On his last visit he was advised to take Humalog consistently before all meals containing significant carbohydrate  He is still not taking her Humalog at mealtimes and now mostly taking after supper based on the blood sugar level Also will take evening Lantus based on nighttime blood sugar; previously was advised to try leaving it off completely With this his blood sugars are usually high after supper and last night got hypoglycemic with taking postprandial Humalog Current meds sugar patterns:  Fasting readings are usually fairly good although recently slightly higher and previously has had readings in the 60s twice  Blood sugars are variable after breakfast and lunch and occasionally high  Occasionally has readings over 200 in the late afternoon  Blood sugars after supper are mostly high but averaging 170 or so approximately  Sporadic low sugars waking up or before supper He has been  compliant with his Victoza        Oral hypoglycemic drugs: Metformin        Side effects from medications: None Insulin regimen: Lantus 28 units in the morning and 12-15 at bedtime    Humalog 0-4 acb, usually 6-8 units acs Proper timing of medications in relation to meals: Yes.          Monitors blood glucose: Once a day.    Glucometer: One Touch.          Blood  Glucose readings: Glucometer download reviewed:   PREMEAL Breakfast Lunch Dinner Bedtime Overall  Glucose range:  61-168   94-149   64-254   61-264  Mean/median:  130      145    POST-MEAL PC Breakfast PC Lunch PC Dinner  Glucose range:   141-231   148-200   Mean/median:   153   170     Hypoglycemia: Has had 2 low readings in the mornings and 1 at bedtime         Meals:2- 3 meals per day. Lunch 2-3 pm; dinner 6-7 pm        Physical activity: exercise:  Recently walking 30 minutes in the mornings       Dietician visit: Most recent: 0/3704           Complications: are: None     Wt Readings from Last 3 Encounters:  12/24/13 166 lb 12.8 oz (75.66 kg)  10/19/13 168 lb (76.204 kg)  09/22/13 170 lb 12.8 oz (77.474 kg)      Lab Results  Component Value Date   HGBA1C 6.8* 09/22/2013   HGBA1C 7.0* 06/25/2013   HGBA1C 7.0* 03/19/2013   Lab Results  Component Value Date   MICROALBUR 15.3* 09/22/2013   LDLCALC 62 09/22/2013   CREATININE 0.8 09/22/2013     HYPERLIPIDEMIA:  Previously taking lovastatin but now only on niacin 1000 mg a day with adequate control of LDL  HDL has been in the  20s previously along with small LDL particles HDL has increased slightly as of 5/15 Particle number was below 1000 in 5/15 He is tolerating niacin 500 twice a day without aspirin   Lab Results  Component Value Date   CHOL 117 09/22/2013   HDL 29.10* 09/22/2013   LDLCALC 62 09/22/2013   LDLDIRECT 45.7 05/31/2009   TRIG 128.0 09/22/2013   CHOLHDL 4 09/22/2013      Medication List       This list is accurate as of: 12/24/13 10:39 AM.  Always use your most recent med list.               ACCU-CHEK AVIVA PLUS W/DEVICE Kit  1 each by Does not apply route daily.     ACCU-CHEK SOFTCLIX LANCET DEV Kit  1 each by Does not apply route daily.     accu-chek softclix lancets  Use as instructed     aspirin 81 MG tablet  Take 81 mg by mouth daily.     glucose blood test strip  Commonly known as:   ACCU-CHEK AVIVA PLUS  Use as instructed to check blood sugars 5 times per day dx code 250.02     Insulin Glargine 100 UNIT/ML Solostar Pen  Commonly known as:  LANTUS  Inject 40 Units into the skin daily. 28 in am and 12 in pm     insulin lispro 100 UNIT/ML KiwkPen  Commonly known as:  HUMALOG KWIKPEN  Inject 8 Units into the skin daily.     Liraglutide 18 MG/3ML Sopn  Commonly known as:  VICTOZA  Inject 1.8 mg into the skin daily.     lisinopril 5 MG tablet  Commonly known as:  PRINIVIL,ZESTRIL  Take 1 tablet (5 mg total) by mouth daily.     metFORMIN 1000 MG tablet  Commonly known as:  GLUCOPHAGE  Take 1 tablet (1,000 mg total) by mouth 2 (two) times daily with a meal.     niacin 500 MG tablet  Commonly known as:  SLO-NIACIN  Take 500 mg by mouth 2 (two) times daily.        Allergies: No Known Allergies  Past Medical History  Diagnosis Date  . Diabetes mellitus 2,000  . HTN (hypertension)   . Hyperlipidemia   . Renal cancer     1992, has 1 kidney ; does not see urology any more  . Erectile dysfunction   . Personal history of colonic adenomas 07/18/2009    Past Surgical History  Procedure Laterality Date  . Nephrectomy  1992    Family History  Problem Relation Age of Onset  . Diabetes Father   . Hypertension Father   . CAD Father     F (MI age 21), Brother in [place] 46  . Colon cancer Neg Hx   . Prostate cancer Brother     dx age 35  . Stroke Neg Hx     Social History:  reports that he has quit smoking. He has never used smokeless tobacco. He reports that he drinks alcohol. He reports that he does not use illicit drugs.  Review of Systems -    He has only mild hypertension previously and is only on 5 mg lisinopril on the systolic reading is now are normal in the office He monitors BP at home, recent readings 130/80  Does not take aspirin for cardiovascular  prophylaxis  Eye exams: Annual   Examination:   BP 142/77  Pulse 69  Temp(Src) 98.3 F  (  36.8 C)  Resp 16  Ht 5' 10"  (1.778 m)  Wt 166 lb 12.8 oz (75.66 kg)  BMI 23.93 kg/m2  SpO2 98%  Body mass index is 23.93 kg/(m^2).   Assesment/Plan:   Diabetes type 2: He has relatively high postprandial readings especially after supper although not monitoring as much after breakfast and lunch Current problems and glucose patterns are discussed in history of present illness He is not taking insulin before meals that contain significant amount of carbohydrate or fat and this causes the high readings Discussed with him that since his fasting blood sugar generally good he does not need Lantus in the evening unless fasting readings are persistently high He will need to try taking Humalog before supper consistently and also other meals based on what he is eating Does need more protein for breakfast when he is eating cereal Currently his weight is stable and he is trying to walk regularly Discussed coverage of medications and insulin while he is in the donut hole. Given coupon for Humalog A1c to be checked today   HYPERLIPIDEMIA: Low HDL has been in the 20s previously along with small LDL particles LDL particle number controlled with niacin alone He is tolerating niacin 500 twice a day without aspirin and will have him continue    Patient Instructions  Must take Humalog with cereal, bread and every pm meal, at least 3-4 units  No Lantus at night    Counseling time over 50% of today's 25 minute visit  Katalyn Matin 12/24/2013, 10:39 AM   Lab results: HDL 25, he can try 1500 mg niacin Potassium 5.3, reduce dietary potassium intake A1c 7%, slightly higher than before    Office Visit on 12/24/2013  Component Date Value Ref Range Status  . Hemoglobin A1C 12/24/2013 7.0* 4.6 - 6.5 % Final   Glycemic Control Guidelines for People with Diabetes:Non Diabetic:  <6%Goal of Therapy: <7%Additional Action Suggested:  >8%   . Sodium 12/24/2013 136  135 - 145 mEq/L Final  . Potassium 12/24/2013  5.3* 3.5 - 5.1 mEq/L Final  . Chloride 12/24/2013 104  96 - 112 mEq/L Final  . CO2 12/24/2013 25  19 - 32 mEq/L Final  . Glucose, Bld 12/24/2013 165* 70 - 99 mg/dL Final  . BUN 12/24/2013 10  6 - 23 mg/dL Final  . Creatinine, Ser 12/24/2013 0.7  0.4 - 1.5 mg/dL Final  . Calcium 12/24/2013 8.9  8.4 - 10.5 mg/dL Final  . GFR 12/24/2013 124.07  >60.00 mL/min Final  . Cholesterol 12/24/2013 128  0 - 200 mg/dL Final   ATP III Classification       Desirable:  < 200 mg/dL               Borderline High:  200 - 239 mg/dL          High:  > = 240 mg/dL  . Triglycerides 12/24/2013 99.0  0.0 - 149.0 mg/dL Final   Normal:  <150 mg/dLBorderline High:  150 - 199 mg/dL  . HDL 12/24/2013 25.10* >39.00 mg/dL Final  . VLDL 12/24/2013 19.8  0.0 - 40.0 mg/dL Final  . LDL Cholesterol 12/24/2013 83  0 - 99 mg/dL Final  . Total CHOL/HDL Ratio 12/24/2013 5   Final                  Men          Women1/2 Average Risk     3.4  3.3Average Risk          5.0          4.42X Average Risk          9.6          7.13X Average Risk          15.0          11.0                      . NonHDL 12/24/2013 102.90   Final   NOTE:  Non-HDL goal should be 30 mg/dL higher than patient's LDL goal (i.e. LDL goal of < 70 mg/dL, would have non-HDL goal of < 100 mg/dL)

## 2014-01-25 ENCOUNTER — Telehealth: Payer: Self-pay | Admitting: *Deleted

## 2014-01-25 NOTE — Telephone Encounter (Signed)
Pt did not want to come in for BP recheck at this time stating "I have an appointment in November"

## 2014-02-25 ENCOUNTER — Telehealth: Payer: Self-pay

## 2014-02-25 NOTE — Telephone Encounter (Signed)
Left a message for call back.  Called patient regarding diabetic eye exam.  When patient calls back please ask:  Have you had a recent (2014-2015) eye exam?    Date of Exam?  Where?    

## 2014-03-12 NOTE — Telephone Encounter (Signed)
Wife, Katharine Look states that pt had an eye exam this summer at [facility].  Called office to confirm.  They stated that the eye exam was  done on December 15, 2013.   A copy of the report was requested.  Receptionist stated that she would fax a copy to [place].  Awaiting fax.

## 2014-03-12 NOTE — Telephone Encounter (Signed)
Fax received

## 2014-03-25 ENCOUNTER — Encounter: Payer: Self-pay | Admitting: Endocrinology

## 2014-03-25 ENCOUNTER — Ambulatory Visit (INDEPENDENT_AMBULATORY_CARE_PROVIDER_SITE_OTHER): Payer: Medicare Other | Admitting: Endocrinology

## 2014-03-25 ENCOUNTER — Other Ambulatory Visit: Payer: Medicare Other

## 2014-03-25 VITALS — BP 141/74 | HR 73 | Temp 98.3°F | Resp 14 | Ht 70.0 in | Wt 169.4 lb

## 2014-03-25 DIAGNOSIS — Z23 Encounter for immunization: Secondary | ICD-10-CM

## 2014-03-25 DIAGNOSIS — E875 Hyperkalemia: Secondary | ICD-10-CM

## 2014-03-25 DIAGNOSIS — IMO0002 Reserved for concepts with insufficient information to code with codable children: Secondary | ICD-10-CM

## 2014-03-25 DIAGNOSIS — E1165 Type 2 diabetes mellitus with hyperglycemia: Secondary | ICD-10-CM

## 2014-03-25 DIAGNOSIS — E785 Hyperlipidemia, unspecified: Secondary | ICD-10-CM

## 2014-03-25 LAB — LIPID PANEL
Cholesterol: 141 mg/dL (ref 0–200)
HDL: 23.8 mg/dL — AB (ref >39.00)
LDL CALC: 91 mg/dL (ref 0–99)
NONHDL: 117.2
Total CHOL/HDL Ratio: 6
Triglycerides: 129 mg/dL (ref 0.0–149.0)
VLDL: 25.8 mg/dL (ref 0.0–40.0)

## 2014-03-25 LAB — COMPREHENSIVE METABOLIC PANEL
ALT: 45 U/L (ref 0–53)
AST: 23 U/L (ref 0–37)
Albumin: 3.8 g/dL (ref 3.5–5.2)
Alkaline Phosphatase: 90 U/L (ref 39–117)
BILIRUBIN TOTAL: 0.5 mg/dL (ref 0.2–1.2)
BUN: 15 mg/dL (ref 6–23)
CO2: 25 mEq/L (ref 19–32)
Calcium: 8.9 mg/dL (ref 8.4–10.5)
Chloride: 103 mEq/L (ref 96–112)
Creatinine, Ser: 0.7 mg/dL (ref 0.4–1.5)
GFR: 126.11 mL/min (ref >60.00)
Glucose, Bld: 154 mg/dL — ABNORMAL HIGH (ref 70–99)
Potassium: 4.6 mEq/L (ref 3.5–5.1)
Sodium: 135 mEq/L (ref 135–145)
Total Protein: 7.3 g/dL (ref 6.0–8.3)

## 2014-03-25 LAB — HEMOGLOBIN A1C: HEMOGLOBIN A1C: 6.7 % — AB (ref 4.6–6.5)

## 2014-03-25 NOTE — Progress Notes (Signed)
Patient ID: Manuel Petty, male   DOB: [birth year], 68 y.o.   MRN: [id number]   Reason for Appointment: Diabetes follow-up   History of Present Illness   Diagnosis: Type 2 DIABETES MELITUS, date of diagnosis: 2007   Previous history: He had been on large doses of Lantus before he was given Victoza in addition and this improved his blood sugar control and reduced his insulin requirement However he subsequently started requiring mealtime coverage with large meals also  RECENT history: Recently his blood sugars are overall reasonably well-controlled at home A1c is pending from today but has been near 7% He was advised to take Humalog consistently before all meals containing significant carbohydrate and he is trying to do better especially at suppertime However he will adjust his Humalog more based on the blood sugar before eating rather than meal content He is still not taking his Humalog at lunch or breakfast consistently However is not changing his Lantus dose arbitrarily Current blood sugar patterns:  Fasting readings are usually slightly over 100 without hypoglycemia  Has had only one episode of low blood sugar at 4 AM  Blood sugars are mostly fairly good in the early afternoon  Mostly has higher readings at bedtime but this is quite variable   Overall average blood sugars 135 with standard deviation 42 He has been  compliant with his Victoza        Oral hypoglycemic drugs: Metformin        Side effects from medications: None Insulin regimen: Lantus 28 units in the morning and 12-15 at bedtime    Humalog 0-4 acb, usually 6-8 units acs Proper timing of medications in relation to meals: Yes.          Monitors blood glucose: Once a day.    Glucometer: One Touch.          Blood Glucose readings: Glucometer download reviewed:   PREMEAL Breakfast Lunch Dinner Bedtime Overall  Glucose range: 69-123 89-136 85-182 115-261   Mean/median: 106    135+/-42    Hypoglycemia: Has had 2  low readings in the mornings and 1 at bedtime         Meals:2- 3 meals per day. Lunch 2-3 pm; dinner 6-7 pm        Physical activity: exercise:  walking 30 minutes in the mornings       Dietician visit: Most recent: 06/3341           Complications: are: None     Wt Readings from Last 3 Encounters:  03/25/14 169 lb 6.4 oz (76.839 kg)  12/24/13 166 lb 12.8 oz (75.66 kg)  10/19/13 168 lb (76.204 kg)      Lab Results  Component Value Date   HGBA1C 7.0* 12/24/2013   HGBA1C 6.8* 09/22/2013   HGBA1C 7.0* 06/25/2013   Lab Results  Component Value Date   MICROALBUR 15.3* 09/22/2013   LDLCALC 83 12/24/2013   CREATININE 0.7 12/24/2013     HYPERLIPIDEMIA:  Previously taking lovastatin but now only on niacin 1000 mg a day with adequate control of LDL  HDL has been in the 20s previously along with small LDL particles HDL has increased slightly as of 5/15 Particle number was below 1000 in 5/15 He is tolerating niacin 750 twice a day amended dose was increased because of low HDL  Lab Results  Component Value Date   CHOL 128 12/24/2013   HDL 25.10* 12/24/2013   LDLCALC 83 12/24/2013   LDLDIRECT 45.7 05/31/2009  TRIG 99.0 12/24/2013   CHOLHDL 5 12/24/2013      Medication List       This list is accurate as of: 03/25/14 10:39 AM.  Always use your most recent med list.               ACCU-CHEK AVIVA PLUS W/DEVICE Kit  1 each by Does not apply route daily.     ACCU-CHEK SOFTCLIX LANCET DEV Kit  1 each by Does not apply route daily.     accu-chek softclix lancets  Use as instructed     aspirin 81 MG tablet  Take 81 mg by mouth daily.     glucose blood test strip  Commonly known as:  ACCU-CHEK AVIVA PLUS  Use as instructed to check blood sugars 5 times per day dx code 250.02     Insulin Glargine 100 UNIT/ML Solostar Pen  Commonly known as:  LANTUS  Inject 40 Units into the skin daily. 28 in am and 12 in pm     insulin lispro 100 UNIT/ML KiwkPen  Commonly known as:   HUMALOG KWIKPEN  Inject 8 Units into the skin daily.     Liraglutide 18 MG/3ML Sopn  Commonly known as:  VICTOZA  Inject 1.8 mg into the skin daily.     lisinopril 5 MG tablet  Commonly known as:  PRINIVIL,ZESTRIL  Take 1 tablet (5 mg total) by mouth daily.     metFORMIN 1000 MG tablet  Commonly known as:  GLUCOPHAGE  Take 1 tablet (1,000 mg total) by mouth 2 (two) times daily with a meal.     niacin 500 MG tablet  Commonly known as:  SLO-NIACIN  Take 500 mg by mouth 2 (two) times daily.        Allergies: No Known Allergies  Past Medical History  Diagnosis Date  . Diabetes mellitus 2,000  . HTN (hypertension)   . Hyperlipidemia   . Renal cancer     1992, has 1 kidney ; does not see urology any more  . Erectile dysfunction   . Personal history of colonic adenomas 07/18/2009    Past Surgical History  Procedure Laterality Date  . Nephrectomy  1992    Family History  Problem Relation Age of Onset  . Diabetes Father   . Hypertension Father   . CAD Father     F (MI age 85), Brother in [place] 27  . Colon cancer Neg Hx   . Prostate cancer Brother     dx age 83  . Stroke Neg Hx     Social History:  reports that he has quit smoking. He has never used smokeless tobacco. He reports that he drinks alcohol. He reports that he does not use illicit drugs.  Review of Systems -    He has only mild hypertension previously and is only on 5 mg lisinopril   He monitors BP at home, recent readings 120-130/80  His potassium was 5.3 on the last visit but has not changes diet much  Does take aspirin for cardiovascular  prophylaxis  Eye exams: Annual   Examination:   BP 141/74 mmHg  Pulse 73  Temp(Src) 98.3 F (36.8 C)  Resp 14  Ht 5' 10"  (1.778 m)  Wt 169 lb 6.4 oz (76.839 kg)  BMI 24.31 kg/m2  SpO2 97%  Body mass index is 24.31 kg/(m^2).   Assesment/Plan:   Diabetes type 2: His overall blood sugars are fairly good with a little less variability However he may  have  high readings after supper when not taking into account how much he is eating and taking up a tray doses of Humalog,: He will reduce the dose if preprandial blood sugar is relatively low also Fasting blood sugars are well controlled with current dose of Lantus Advised him on adjusting Humalog before meals based on amount of food/carbs at supper Advised him to continue regular exercise also  A1c to be checked today   HYPERLIPIDEMIA:  HDL has been in the 20s previously along with small LDL particles LDL particle number controlled with niacin alone He is tolerating niacin 750 twice a day without aspirin and will recheck lipids Will also need follow-up LDL particle number next year Although he may benefit from prophylactic statin drug he is reluctant to take this also  History of mild hyperkalemia: We will recheck potassium and consider switching lisinopril to another drug Discussed potassium contents of various foods  Counseling time over 50% of today's 25 minute visit  Kazden Largo 03/25/2014, 10:39 AM

## 2014-03-25 NOTE — Patient Instructions (Addendum)
Adjust Humalog based on amount of food/carbs at supper

## 2014-03-31 ENCOUNTER — Other Ambulatory Visit: Payer: Self-pay | Admitting: Endocrinology

## 2014-05-14 ENCOUNTER — Other Ambulatory Visit: Payer: Self-pay | Admitting: Endocrinology

## 2014-06-07 ENCOUNTER — Other Ambulatory Visit: Payer: Self-pay | Admitting: *Deleted

## 2014-06-07 ENCOUNTER — Telehealth: Payer: Self-pay | Admitting: Endocrinology

## 2014-06-07 MED ORDER — INSULIN GLARGINE 100 UNIT/ML SOLOSTAR PEN: PEN_INJECTOR | SUBCUTANEOUS | Status: DC

## 2014-06-07 NOTE — Telephone Encounter (Signed)
rx sent

## 2014-06-07 NOTE — Telephone Encounter (Signed)
Patient would like his medications refilled   Lantus solastar 3 month supply (please submit new rx)  Pharmacy: [facility] ph: [phone] fax: [phone]  Thank you

## 2014-06-23 ENCOUNTER — Encounter: Payer: Self-pay | Admitting: Endocrinology

## 2014-06-23 ENCOUNTER — Ambulatory Visit (INDEPENDENT_AMBULATORY_CARE_PROVIDER_SITE_OTHER): Payer: Medicare Other | Admitting: Endocrinology

## 2014-06-23 VITALS — BP 146/76 | HR 71 | Temp 98.1°F | Resp 14 | Ht 70.0 in | Wt 175.2 lb

## 2014-06-23 DIAGNOSIS — IMO0002 Reserved for concepts with insufficient information to code with codable children: Secondary | ICD-10-CM

## 2014-06-23 DIAGNOSIS — E1165 Type 2 diabetes mellitus with hyperglycemia: Secondary | ICD-10-CM

## 2014-06-23 DIAGNOSIS — E785 Hyperlipidemia, unspecified: Secondary | ICD-10-CM

## 2014-06-23 DIAGNOSIS — R030 Elevated blood-pressure reading, without diagnosis of hypertension: Secondary | ICD-10-CM

## 2014-06-23 LAB — BASIC METABOLIC PANEL
BUN: 14 mg/dL (ref 6–23)
CO2: 26 meq/L (ref 19–32)
Calcium: 9.3 mg/dL (ref 8.4–10.5)
Chloride: 105 mEq/L (ref 96–112)
Creatinine, Ser: 0.71 mg/dL (ref 0.40–1.50)
GFR: 117.86 mL/min (ref >60.00)
GLUCOSE: 122 mg/dL — AB (ref 70–99)
Potassium: 4.3 mEq/L (ref 3.5–5.1)
SODIUM: 136 meq/L (ref 135–145)

## 2014-06-23 LAB — HEMOGLOBIN A1C: Hgb A1c MFr Bld: 6.9 % — ABNORMAL HIGH (ref 4.6–6.5)

## 2014-06-23 NOTE — Patient Instructions (Addendum)
Take Humalog before supper DAILY   Take 10 Lantus at night   Check into V-go pump    INDOOR EXERCISE IDEAS Here's an example of a creative, compact workout (perform each move for 2-3 minutes): Marland Kitchen Warm up. Put on some music that makes you feel like moving, and dance around the living room. . Watch exercise shows on TV and move along with them. You don't have to invest in a lot of exercise videos if your budget is strapped. There are tons of free cable channels that have daily exercise shows on them for all levels - beginner through advanced. . Walk up and down the steps. . Do dumbbell curls and presses (if you don't have weights, use full water bottles). . Do assisted squats, keeping your back on a fitness ball against the wall or using the back of the couch for support. . Shadow box: Lift and lower the left leg; jab with the right arm, then the left; then lift and lower the right leg. Marland Kitchen Fence (you don't even need swords). Pretend you're holding a sword in each hand. Create an X pattern standing still, then moving forward and back. . Hop on your exercise bike or treadmill -- or, for something different, use a weighted hula hoop. If you don't have any of those, just go back to dancing. . Do abdominal crunches (hold a weighted ball for added resistance). Marland Kitchen Cool down with [employer] "I Feel Good" -- or whatever tune makes you feel good

## 2014-06-23 NOTE — Progress Notes (Signed)
Patient ID: Manuel Petty, male   DOB: March 29, 1946, 69 y.o.   MRN: [id number]   Reason for Appointment: Diabetes follow-up   History of Present Illness   Diagnosis: Type 2 DIABETES MELITUS, date of diagnosis: 2007   Previous history: He had been on large doses of Lantus before he was given Victoza in addition and this improved his blood sugar control and reduced his insulin requirement However he subsequently started requiring mealtime coverage with large meals also  RECENT history: From his blood sugar download his blood sugars are overall reasonably well-controlled  He may have had some high readings in December when he ran out of Lantus for some time A1c is pending from today but has been 7% or below consistently He has been consistently advised to take Humalog before his main meals but he still does not take it consistently at suppertime and does not have have an explanation He will sometimes forget to take it when he is eating out but other times he will take it before he leaves home. He will adjust the dose between 6-8 units at supper time based on what he is eating. However does not take Humalog at breakfast or lunch; does periodically have higher readings during the day also Current blood sugar patterns:  Fasting readings are usually below 100 and last month was having overnight hypoglycemia at times also  This is despite his taking consistent amount of 12 units at night and does not understand the need for reducing the dose if he has low sugars in the mornings; does not take any Humalog at bedtime  Blood sugars after supper are on an average reasonably good but around 200 or more if he does not take his Humalog He has been  compliant with his Victoza 1.8 mg but still thinks that he may not consistently watch his portions        Oral hypoglycemic drugs: Metformin        Side effects from medications: None Insulin regimen: Lantus 28 units in the morning and 12 at bedtime     Humalog 0-4 acb, 6-8 units acs Proper timing of medications in relation to meals: Yes.          Monitors blood glucose: Once a day.    Glucometer: One Touch.          Blood Glucose readings: Glucometer download reviewed:   PRE-MEAL Breakfast Lunch Dinner Bedtime Overall  Glucose range:  65-170    107-212   109-257    Mean/median:  103  115   143   168  136   POST-MEAL PC Breakfast PC Lunch PC Dinner  Glucose range:     Mean/median:       PREMEAL Breakfast Lunch Dinner Bedtime Overall  Glucose range: 69-123 89-136 85-182 115-261 55-  Mean/median: 106    135+/-42    Hypoglycemia: Less recently but was having mildly low sugars fasting are no overnight about 2-3 weeks ago         Meals:2- 3 meals per day. Lunch 2-3 pm; dinner 6-7 pm        Physical activity: exercise: not walking       Dietician visit: Most recent: 10/628           Complications: are: None     Wt Readings from Last 3 Encounters:  06/23/14 175 lb 3.2 oz (79.47 kg)  03/25/14 169 lb 6.4 oz (76.839 kg)  12/24/13 166 lb 12.8 oz (75.66 kg)  Lab Results  Component Value Date   HGBA1C 6.7* 03/25/2014   HGBA1C 7.0* 12/24/2013   HGBA1C 6.8* 09/22/2013   Lab Results  Component Value Date   MICROALBUR 15.3* 09/22/2013   LDLCALC 91 03/25/2014   CREATININE 0.7 03/25/2014     HYPERLIPIDEMIA:  Previously taking lovastatin but now only on niacin 1000 mg a day with adequate control of LDL  HDL has been in the 20s previously along with small LDL particles HDL has increased slightly as of 5/15 Particle number was below 1000 in 5/15 He is tolerating niacin 750 twice a day amended dose was increased because of low HDL  Lab Results  Component Value Date   CHOL 141 03/25/2014   HDL 23.80* 03/25/2014   LDLCALC 91 03/25/2014   LDLDIRECT 45.7 05/31/2009   TRIG 129.0 03/25/2014   CHOLHDL 6 03/25/2014      Medication List       This list is accurate as of: 06/23/14 10:01 AM.  Always use your most recent med list.                ACCU-CHEK AVIVA PLUS W/DEVICE Kit  1 each by Does not apply route daily.     ACCU-CHEK SOFTCLIX LANCET DEV Kit  1 each by Does not apply route daily.     accu-chek softclix lancets  Use as instructed     aspirin 81 MG tablet  Take 81 mg by mouth daily.     B-D ULTRAFINE III SHORT PEN 31G X 8 MM Misc  Generic drug:  Insulin Pen Needle  Use 4 times daily as  directed     glucose blood test strip  Commonly known as:  ACCU-CHEK AVIVA PLUS  Use as instructed to check blood sugars 5 times per day dx code 250.02     Insulin Glargine 100 UNIT/ML Solostar Pen  Commonly known as:  LANTUS  Inject 28 units in am and 12 units in pm     insulin lispro 100 UNIT/ML KiwkPen  Commonly known as:  HUMALOG KWIKPEN  Inject 8 Units into the skin daily.     Liraglutide 18 MG/3ML Sopn  Commonly known as:  VICTOZA  Inject 1.8 mg into the skin daily.     lisinopril 5 MG tablet  Commonly known as:  PRINIVIL,ZESTRIL  Take 1 tablet by mouth  daily     metFORMIN 1000 MG tablet  Commonly known as:  GLUCOPHAGE  Take 1 tablet (1,000 mg) by mouth 2 times daily with a  meal.     metFORMIN 500 MG 24 hr tablet  Commonly known as:  GLUCOPHAGE-XR  Take 2 tablets by mouth  twice a day     niacin 500 MG tablet  Commonly known as:  SLO-NIACIN  Take 500 mg by mouth 2 (two) times daily.        Allergies: No Known Allergies  Past Medical History  Diagnosis Date  . Diabetes mellitus 2,000  . HTN (hypertension)   . Hyperlipidemia   . Renal cancer     1992, has 1 kidney ; does not see urology any more  . Erectile dysfunction   . Personal history of colonic adenomas 07/18/2009    Past Surgical History  Procedure Laterality Date  . Nephrectomy  1992    Family History  Problem Relation Age of Onset  . Diabetes Father   . Hypertension Father   . CAD Father     F (MI age 2), Brother in [place] 31  .  Colon cancer Neg Hx   . Prostate cancer Brother     dx age 72  . Stroke Neg Hx      Social History:  reports that he has quit smoking. He has never used smokeless tobacco. He reports that he drinks alcohol. He reports that he does not use illicit drugs.  Review of Systems -    He has only mild hypertension previously and is only on 5 mg lisinopril   He monitors BP at home, recent readings 120-130/70s  His potassium was 5.3 previously  Does take aspirin for cardiovascular  prophylaxis  Eye exams: Annual   Examination:   BP 146/76 mmHg  Pulse 71  Temp(Src) 98.1 F (36.7 C)  Resp 14  Ht 5' 10"  (1.778 m)  Wt 175 lb 3.2 oz (79.47 kg)  BMI 25.14 kg/m2  SpO2 97%  Body mass index is 25.14 kg/(m^2).    Diabetic foot exam shows normal monofilament sensation in the toes and plantar surfaces, no skin lesions or ulcers on the feet and normal pedal pulses No edema.  Assesment/Plan:   Diabetes type 2, nonobese: His overall blood sugars are fairly good but is not controlling his postprandial readings after supper or with large meals during the day. This is despite instructing him on mealtime coverage and need for taking Humalog before eating at least at suppertime. He is only taking small amounts of Humalog when used with good results. Discussed onset of action of Humalog, need to take it right before eating and also to take it when eating out. He will try to do this more consistently Also discussed V-go pump in detail and he will look into this to help with compliance and reduced number of injections  Lantus insulin: He is taking relatively large dose in the evening since fasting blood sugars are frequently normal now and previously had some hypoglycemia also. Discussed need to adjust this dose based on fasting blood sugar trend and he will at least reduce it by 2 units now  He has gained weight and he needs to be more consistent with diet. Advised him to restart regular exercise also  A1c to be checked today   HYPERLIPIDEMIA:  HDL has been in the 20s  previously along with small LDL particles LDL particle number controlled with niacin alone and will need to be rechecked today. He is tolerating niacin 750 twice a day   History of mild hyperkalemia: We will recheck potassium today  Counseling time over 50% of today's 25 minute visit  Christyanna Mckeon 06/23/2014, 10:01 AM

## 2014-06-24 LAB — LIPOPROTEIN ANALYSIS BY NMR
HDL Particle Number: 22.9 umol/L — ABNORMAL LOW (ref >=30.5)
LDL PARTICLE NUMBER: 966 nmol/L (ref <1000)
LDL Size: 20.3 nm (ref >20.5)
LP-IR Score: 85 — ABNORMAL HIGH (ref <=45)
SMALL LDL PARTICLE NUMBER: 567 nmol/L — AB (ref <=527)

## 2014-09-14 ENCOUNTER — Encounter: Payer: Self-pay | Admitting: Internal Medicine

## 2014-09-14 ENCOUNTER — Other Ambulatory Visit: Payer: Self-pay | Admitting: Endocrinology

## 2014-09-15 ENCOUNTER — Other Ambulatory Visit: Payer: Self-pay | Admitting: *Deleted

## 2014-09-15 MED ORDER — GLUCOSE BLOOD VI STRP: ORAL_STRIP | Status: DC

## 2014-09-17 ENCOUNTER — Other Ambulatory Visit: Payer: Self-pay | Admitting: Endocrinology

## 2014-09-21 ENCOUNTER — Encounter: Payer: Self-pay | Admitting: Endocrinology

## 2014-09-21 ENCOUNTER — Ambulatory Visit (INDEPENDENT_AMBULATORY_CARE_PROVIDER_SITE_OTHER): Payer: Medicare Other | Admitting: Endocrinology

## 2014-09-21 VITALS — BP 140/68 | HR 85 | Temp 98.3°F | Resp 14 | Ht 70.0 in | Wt 172.4 lb

## 2014-09-21 DIAGNOSIS — E785 Hyperlipidemia, unspecified: Secondary | ICD-10-CM

## 2014-09-21 DIAGNOSIS — E1165 Type 2 diabetes mellitus with hyperglycemia: Secondary | ICD-10-CM

## 2014-09-21 DIAGNOSIS — IMO0002 Reserved for concepts with insufficient information to code with codable children: Secondary | ICD-10-CM

## 2014-09-21 LAB — COMPREHENSIVE METABOLIC PANEL
ALT: 40 U/L (ref 0–53)
AST: 28 U/L (ref 0–37)
Albumin: 4.3 g/dL (ref 3.5–5.2)
Alkaline Phosphatase: 93 U/L (ref 39–117)
BILIRUBIN TOTAL: 0.5 mg/dL (ref 0.2–1.2)
BUN: 11 mg/dL (ref 6–23)
CO2: 27 meq/L (ref 19–32)
Calcium: 9.7 mg/dL (ref 8.4–10.5)
Chloride: 103 mEq/L (ref 96–112)
Creatinine, Ser: 0.74 mg/dL (ref 0.40–1.50)
GFR: 112.28 mL/min (ref >60.00)
Glucose, Bld: 161 mg/dL — ABNORMAL HIGH (ref 70–99)
Potassium: 4.5 mEq/L (ref 3.5–5.1)
Sodium: 137 mEq/L (ref 135–145)
Total Protein: 7.4 g/dL (ref 6.0–8.3)

## 2014-09-21 LAB — POCT URINALYSIS DIPSTICK
Bilirubin, UA: NEGATIVE
Blood, UA: NEGATIVE
GLUCOSE UA: NEGATIVE
Ketones, UA: NEGATIVE
Leukocytes, UA: NEGATIVE
NITRITE UA: NEGATIVE
Spec Grav, UA: 1.025
UROBILINOGEN UA: NEGATIVE
pH, UA: 5

## 2014-09-21 LAB — MICROALBUMIN / CREATININE URINE RATIO
Creatinine,U: 182.9 mg/dL
Microalb Creat Ratio: 8.6 mg/g (ref 0.0–30.0)
Microalb, Ur: 15.8 mg/dL — ABNORMAL HIGH (ref 0.0–1.9)

## 2014-09-21 LAB — HEMOGLOBIN A1C: HEMOGLOBIN A1C: 6.7 % — AB (ref 4.6–6.5)

## 2014-09-21 NOTE — Progress Notes (Signed)
Patient ID: Manuel Petty, male   DOB: May 08, 1946, 69 y.o.   MRN: [id number]   Reason for Appointment: Diabetes follow-up   History of Present Illness   Diagnosis: Type 2 DIABETES MELITUS, date of diagnosis: 2007   Previous history: He had been on large doses of Lantus before he was given Victoza in addition and this improved his blood sugar control and reduced his insulin requirement However he subsequently started requiring mealtime coverage with large meals also  RECENT history: His A1c is pending from today but has been 7% or below consistently He continues to be taking a regimen of basal bolus insulin and Victoza 1.8 mg Currently on twice a day Lantus insulin He does take Humalog now with his lunch if he is eating a significant amount of carbohydrate and does appear to be taking his supper time Humalog consistently However he does not always adjust his Humalog based on what he is eating and this may cause some hypoglycemia and occasionally high readings also He will sometimes forget to take it when he is eating out  Current blood sugar patterns and problems identified:  Fasting readings are sometimes below 100 when checked early in the morning  He has some tendency to overnight hypoglycemia with occasional readings in the 50s at about 1 AM even though he is eating his evening meal and not taking any Humalog after 7 PM  He does not get any hypoglycemia with his exercise regimen in the morning  Blood sugars after lunch maybe occasionally high and are slightly higher late morning even though he does not always eat breakfast  He has done better with keeping a steady dose of Lantus at night and not adjusting it on a daily basis  He is still compliant with checking his blood sugars on an average almost 4 times a day  Oral hypoglycemic drugs: Metformin        Side effects from medications: None Insulin regimen: Lantus 28 units in the morning and 10 at bedtime     Humalog 0-4 acb, 6-8 units acs Proper timing of medications in relation to meals: Yes.          Monitors blood glucose:  3-4 times a day.    Glucometer: One Touch.          Blood Glucose readings: Glucometer download reviewed:   PRE-MEAL Breakfast Lunch Dinner Bedtime Overall  Glucose range:  74-159   67 74    122-209    Mean/median:  134   142   137   167  135   POST-MEAL PC Breakfast PC Lunch  overnight   Glucose range:   66-217   58, 55   Mean/median:             Meals:2- 3 meals per day. Lunch 2-3 pm; dinner 6-7 pm        Physical activity: exercise: 30 min walking in the mornings       Dietician visit: Most recent: 06/9922           Complications: are: None     Wt Readings from Last 3 Encounters:  09/21/14 172 lb 6.4 oz (78.2 kg)  06/23/14 175 lb 3.2 oz (79.47 kg)  03/25/14 169 lb 6.4 oz (76.839 kg)      Lab Results  Component Value Date   HGBA1C 6.9* 06/23/2014   HGBA1C 6.7* 03/25/2014   HGBA1C 7.0* 12/24/2013   Lab Results  Component Value Date   MICROALBUR 15.3* 09/22/2013  [place] 91 03/25/2014   CREATININE 0.71 06/23/2014     HYPERLIPIDEMIA:  Previously taking lovastatin but now only on niacin 1000 mg a day with adequate control of LDL  HDL has been in the 20s previously along with small LDL particles HDL has increased slightly as of 5/15 Particle number was below 1000 in 5/15 He is tolerating niacin 750 twice a day amended dose was increased because of low HDL  Lab Results  Component Value Date   CHOL 141 03/25/2014   HDL 23.80* 03/25/2014   LDLCALC 91 03/25/2014   LDLDIRECT 45.7 05/31/2009   TRIG 129.0 03/25/2014   CHOLHDL 6 03/25/2014      Medication List       This list is accurate as of: 09/21/14 11:12 AM.  Always use your most recent med list.               ACCU-CHEK AVIVA PLUS W/DEVICE Kit  1 each by Does not apply route daily.     ACCU-CHEK SOFTCLIX LANCET DEV Kit  1 each by Does not apply route daily.     accu-chek softclix  lancets  Use as instructed     aspirin 81 MG tablet  Take 81 mg by mouth daily.     B-D ULTRAFINE III SHORT PEN 31G X 8 MM Misc  Generic drug:  Insulin Pen Needle  Use 4 times daily as  directed     glucose blood test strip  Commonly known as:  ACCU-CHEK AVIVA PLUS  Use as instructed to check blood sugars 5 times per day dx code E11.65     HUMALOG KWIKPEN 100 UNIT/ML KiwkPen  Generic drug:  insulin lispro  Inject subcutaneously 8  units daily     Insulin Glargine 100 UNIT/ML Solostar Pen  Commonly known as:  LANTUS  Inject 28 units in am and 12 units in pm     lisinopril 5 MG tablet  Commonly known as:  PRINIVIL,ZESTRIL  Take 1 tablet by mouth  daily     metFORMIN 1000 MG tablet  Commonly known as:  GLUCOPHAGE  Take 1 tablet by mouth  twice a day with meals     niacin 500 MG tablet  Commonly known as:  SLO-NIACIN  Take 500 mg by mouth 2 (two) times daily.     VICTOZA 18 MG/3ML Sopn  Generic drug:  Liraglutide  Inject subcutaneously 1.83m daily        Allergies: No Known Allergies  Past Medical History  Diagnosis Date  . Diabetes mellitus 2,000  . HTN (hypertension)   . Hyperlipidemia   . Renal cancer     1992, has 1 kidney ; does not see urology any more  . Erectile dysfunction   . Personal history of colonic adenomas 07/18/2009    Past Surgical History  Procedure Laterality Date  . Nephrectomy  1992    Family History  Problem Relation Age of Onset  . Diabetes Father   . Hypertension Father   . CAD Father     F (MI age 67, Brother in [place] 658 . Colon cancer Neg Hx   . Prostate cancer Brother     dx age 55 . Stroke Neg Hx     Social History:  reports that he has quit smoking. He has never used smokeless tobacco. He reports that he drinks alcohol. He reports that he does not use illicit drugs.  Review of Systems -    He has only mild hypertension  previously and is only on 5 mg lisinopril   He monitors BP at home, recent readings 120-130/70s  fairly consistently, does have some white coat syndrome  His potassium has been consistently normal now, high on one occasion  Eye exams: Annual  Diabetic foot exam was done in 06/2014   Examination:   BP 163/84 mmHg  Pulse 85  Temp(Src) 98.3 F (36.8 C)  Resp 14  Ht 5' 10"  (1.778 m)  Wt 172 lb 6.4 oz (78.2 kg)  BMI 24.74 kg/m2  SpO2 98%  Body mass index is 24.74 kg/(m^2).   Repeat blood pressure 140/68 No edema.  Assesment/Plan:   Diabetes type 2, nonobese: Overall has fairly good blood sugars with A1c usually below 7% See history of present illness for detailed discussion office current blood sugar patterns, problems identified and current management  His overall blood sugars are fairly good but he does show variability in his blood sugars after lunch and especially after supper He is not adjusting his Humalog based on his meal size, carbohydrate intake and type of meal He does appear to be losing a little weight with starting to walk regularly and usually watching portions However his diet is still somewhat variable He was given the option of using the V-go pump but he does not want to try this now  However he is a good candidate for using [place] or [place] for once a day convenience; also he tends to have some low sugars a few hours after taking his Lantus and evenings even though fasting readings are not relatively low  A1c to be checked today   HYPERLIPIDEMIA:  HDL has been in the 20s previously along with small LDL particles LDL particle number is well controlled with niacin alone and LDL has been below 100  He is tolerating niacin 1000 mg twice a day   Hypertension: This is very mild.  He has good readings at home and slightly high readings in the office  Counseling time on subjects discussed above is over 50% of today's 25 minute visit   Tyion Boylen 09/21/2014, 11:12 AM

## 2014-09-21 NOTE — Patient Instructions (Signed)
[place] 38 units and adjust every 4-5 days to keep am sugar in 90-130 range  Adjust Humalog 6-10 units based on amount of v

## 2014-11-03 ENCOUNTER — Telehealth: Payer: Self-pay | Admitting: Behavioral Health

## 2014-11-03 ENCOUNTER — Encounter: Payer: Self-pay | Admitting: Behavioral Health

## 2014-11-03 NOTE — Telephone Encounter (Signed)
Pre-Visit Call completed with patient and chart updated.   Pre-Visit Info documented in Specialty Comments under [employer].    

## 2014-11-04 ENCOUNTER — Ambulatory Visit (INDEPENDENT_AMBULATORY_CARE_PROVIDER_SITE_OTHER): Payer: Medicare Other | Admitting: Medical

## 2014-11-04 ENCOUNTER — Encounter: Payer: Self-pay | Admitting: Medical

## 2014-11-04 VITALS — BP 146/74 | HR 68 | Temp 98.1°F | Ht 70.0 in | Wt 174.8 lb

## 2014-11-04 DIAGNOSIS — Z136 Encounter for screening for cardiovascular disorders: Secondary | ICD-10-CM | POA: Diagnosis not present

## 2014-11-04 DIAGNOSIS — Z0000 Encounter for general adult medical examination without abnormal findings: Secondary | ICD-10-CM

## 2014-11-04 DIAGNOSIS — Z125 Encounter for screening for malignant neoplasm of prostate: Secondary | ICD-10-CM | POA: Diagnosis not present

## 2014-11-04 LAB — PSA: PSA: 0.47 ng/mL (ref 0.10–4.00)

## 2014-11-04 MED ORDER — PNEUMOCOCCAL VAC POLYVALENT 25 MCG/0.5ML IJ INJ: 0.5000 mL | INJECTION | Freq: Once | INTRAMUSCULAR | Status: DC

## 2014-11-04 NOTE — Progress Notes (Signed)
Subjective:    Patient ID: Manuel Petty, male    DOB: March 05, 1946, 69 y.o.   MRN: [id number]  HPI  Pt in for physical. He feels well. Negative ros.  He walks one time a week about 30 minutes a week. But describes active around his house daily.  No falls in last year.   Not having problems with driving(never gets lost). Good Function daily activities.  Pt can do serial 7 partially. Has difficulty spelling word world back words. He can tell time without difficulty using hands on watch face/not digital.  Pt had colonsocopy in 2011. Pt had polyp. Order placed to repeat in 2015. He never got done. Discussed with pt and he will get notify office of GI   Pt stopped smoking 24 yrs ago. Smoked total approximate 20 yrs ago.   No hesitant urine flow/signs or obstruction.  Never had abdominal US done before/screening AAA.  Pt medical provider Dr. Dwyane Dee Endocrinologist, Dr. Larose Kells PCP,  Dr. Carlean Purl GI, and sees opthalmologist(august appointment pending)    1. Risk factors based on Past M, S, F history: reviewed 2. Physical Activities: + routine, x5/ week 3. Depression/mood: neg screening  4. Hearing: No problemss noted or reported  5. ADL's: independent 6. Fall Risk: low, counseled  7. home Safety: does feel safe at home  8. Height, weight, & visual acuity: see VS, sees eye doctor, due for a visit, rec to call and schedule 9. Counseling: provided 10. Labs ordered based on risk factors: if needed  11. Referral Coordination: if needed .2. Cognitive Assessment: cognition-motor skills wnl   In addition, today we discussed the following:  DM, under the care of endocrinology, last A1c 6.7 Hypertension, ambulatory BPs are usually lower than today. Good medication compliance with all meds ON review appears to need psv 23.          Review of Systems  Constitutional: Negative for fever, chills and fatigue.  HENT: Negative for dental problem, ear discharge, ear pain, mouth  sores and sinus pressure.   Respiratory: Negative for apnea, cough, choking, shortness of breath and wheezing.   Cardiovascular: Negative for chest pain and palpitations.  Gastrointestinal: Negative for abdominal pain, diarrhea, constipation, blood in stool, abdominal distention and anal bleeding.  Endocrine: Negative for polydipsia, polyphagia and polyuria.  Genitourinary: Negative for dysuria, frequency, hematuria, flank pain, scrotal swelling, difficulty urinating and testicular pain.       None reported. No urinary obstruction flow symptoms.  Musculoskeletal: Negative for back pain and gait problem.  Hematological: Negative for adenopathy. Does not bruise/bleed easily.  Psychiatric/Behavioral: Negative for hallucinations, behavioral problems, confusion, dysphoric mood and decreased concentration. The patient is not nervous/anxious.        Objective:   Physical Exam   General Mental Status- Alert. Orientation- Oriented x3.  Build and Nutrition- Well nourished and Well Developed.(thin side/weight stable)  Skin General:-Normal. Color- Normal color. Moisture- Normal. Temperature-Warm.  HEENT  Ears- Normal. Auditory Canal- Bilateral-lt side normal. Rt side waxTympanic Membrane- Bilateral- lt tm normal. Rt tm I could not see due to wax. Nose & Sinuses- Normal. Nostrils-Bilateral- Normal. Mouth & Throat-Normal.  Neck Neck- No Bruits or Masses. Trachea midline.  Thyroid- Normal.  Chest and Lung Exam Percussion: Quality and Intensity-Percussion normal. Percussion of the chest reveals- No Dullness.  Palpation: Palpation of the chest reveals- Non-tender- No dullness. Auscultation: Breath Sounds- Normal.  Adventitous Sounds:-No adventitious sounds.  Cardiovascular Inspection:- No Heaves. Auscultation:-Normal sinus rhythm without murmur gallop, S1 WNL and S2 WNL.  Abdomen Inspection:-Inspection Normal. Inspection of the abdomen reveals- No hernias Palpation/Percussion:-  Palpation and Percussion of the Abdomen reveal- Non Tender and No Palpable abdominal masses. Liver: Other Characteristics- No hepatomegaly. Spleen:Other Characteristics- No Splenomegaly. Auscultation:- Auscultation of the abdomen reveals- Bowel sounds normal and No Abdominal bruits.    Rectal Anorectal Exam: Performed- Normal sphincter tone. No masses noted. Prostate smooth normal size. Stool HEME Negative.  Peripheral  Vascular Lower Extremity:Inspection- Bilateral-Inspection Normal  Palpation: Femoral pulse- Bilateral- 2+. Popliteal pulse- Bilateral-2+. Dorsalis pedis pulse- Bilateral- 1/2+. Edema- Bilateral- No edema.  Neurologic Mental Status:- Normal. Cranial Nerves:-Normal Bilaterally. Motor:-Normal. Strength:5/5 normal muscle strength-All Muscles. . Coordination-Normal. Gait- Normal.  Meningeal Signs- None.  Musculoskeletal Global Assessment General-Joints show full range of motion without obvious deformity and Normal muscle mass. Strength in upper and lower extremities.  Lymphatics General lymphatics Description- No generalized lymphadenopathy.       Assessment & Plan:  I tried to open up the medicare smart subsequent exam (E2336) unscuccesful.   Reviewed/updated medical and fh. His BMI was 25.1 No obvious cognitive problems detected today. No depression reported.No functional disabilities observed on exam and none reported on review.   This is a routine wellness  examination for this patient . I reviewed all health maintenance protocols including  Colonoscopy.  Needed referrals were placed if indicated. Age and diagnosis  appropriate screening labs were ordered. His immunization history was reviewed and appropriate vaccinations were ordered. Her current medications and allergies were reviewed and needed refills of his chronic medications were ordered(none needed today). The plan for yearly health maintenance was discussed all orders and referrals were made as  appropriate.

## 2014-11-04 NOTE — Assessment & Plan Note (Addendum)
Recent labs done in may. I do want you to get follow up colonsocpy which you were to do in 2015.  Please see your opthalmologist for yearly eye exam.  I did decide to put in psa order today.(Pt made aware of medicare criteria today). He expressed surprise that they don't allow to be done every year. I explained I would put in order and he could get done.  Will put in [place] initial screening. Advsied to go to radiology and get that scheduled. Not in imaging and pt states never had that done.  PSV 23 today.

## 2014-11-04 NOTE — Progress Notes (Signed)
Pre visit review using our clinic review tool, if applicable. No additional management support is needed unless otherwise documented below in the visit note. 

## 2014-11-04 NOTE — Patient Instructions (Addendum)
I do want you to get follow up colonsocpy which you were to do in 2015.  Please see your opthalmologist for yearly eye exam.  I did decide to put in psa today.  Will put in [place] initial screening.  Vaccination pcv 23 today.  Continue current meds for chronic conditions.  Follow up in 3 months or as needed  Preventive Care for Adults A healthy lifestyle and preventive care can promote health and wellness. Preventive health guidelines for men include the following key practices:  A routine yearly physical is a good way to check with your health care provider about your health and preventative screening. It is a chance to share any concerns and updates on your health and to receive a thorough exam.  Visit your dentist for a routine exam and preventative care every 6 months. Brush your teeth twice a day and floss once a day. Good oral hygiene prevents tooth decay and gum disease.  The frequency of eye exams is based on your age, health, family medical history, use of contact lenses, and other factors. Follow your health care provider's recommendations for frequency of eye exams.  Eat a healthy diet. Foods such as vegetables, fruits, whole grains, low-fat dairy products, and lean protein foods contain the nutrients you need without too many calories. Decrease your intake of foods high in solid fats, added sugars, and salt. Eat the right amount of calories for you.Get information about a proper diet from your health care provider, if necessary.  Regular physical exercise is one of the most important things you can do for your health. Most adults should get at least 150 minutes of moderate-intensity exercise (any activity that increases your heart rate and causes you to sweat) each week. In addition, most adults need muscle-strengthening exercises on 2 or more days a week.  Maintain a healthy weight. The body mass index (BMI) is a screening tool to identify possible weight problems. It  provides an estimate of body fat based on height and weight. Your health care provider can find your BMI and can help you achieve or maintain a healthy weight.For adults 20 years and older:  A BMI below 18.5 is considered underweight.  A BMI of 18.5 to 24.9 is normal.  A BMI of 25 to 29.9 is considered overweight.  A BMI of 30 and above is considered obese.  Maintain normal blood lipids and cholesterol levels by exercising and minimizing your intake of saturated fat. Eat a balanced diet with plenty of fruit and vegetables. Blood tests for lipids and cholesterol should begin at age 35 and be repeated every 5 years. If your lipid or cholesterol levels are high, you are over 50, or you are at high risk for heart disease, you may need your cholesterol levels checked more frequently.Ongoing high lipid and cholesterol levels should be treated with medicines if diet and exercise are not working.  If you smoke, find out from your health care provider how to quit. If you do not use tobacco, do not start.  Lung cancer screening is recommended for adults aged 18-80 years who are at high risk for developing lung cancer because of a history of smoking. A yearly low-dose CT scan of the lungs is recommended for people who have at least a 30-pack-year history of smoking and are a current smoker or have quit within the past 15 years. A pack year of smoking is smoking an average of 1 pack of cigarettes a day for 1 year (for  example: 1 pack a day for 30 years or 2 packs a day for 15 years). Yearly screening should continue until the smoker has stopped smoking for at least 15 years. Yearly screening should be stopped for people who develop a health problem that would prevent them from having lung cancer treatment.  If you choose to drink alcohol, do not have more than 2 drinks per day. One drink is considered to be 12 ounces (355 mL) of beer, 5 ounces (148 mL) of wine, or 1.5 ounces (44 mL) of liquor.  Avoid use of  street drugs. Do not share needles with anyone. Ask for help if you need support or instructions about stopping the use of drugs.  High blood pressure causes heart disease and increases the risk of stroke. Your blood pressure should be checked at least every 1-2 years. Ongoing high blood pressure should be treated with medicines, if weight loss and exercise are not effective.  If you are 71-15 years old, ask your health care provider if you should take aspirin to prevent heart disease.  Diabetes screening involves taking a blood sample to check your fasting blood sugar level. This should be done once every 3 years, after age 10, if you are within normal weight and without risk factors for diabetes. Testing should be considered at a younger age or be carried out more frequently if you are overweight and have at least 1 risk factor for diabetes.  Colorectal cancer can be detected and often prevented. Most routine colorectal cancer screening begins at the age of 56 and continues through age 12. However, your health care provider may recommend screening at an earlier age if you have risk factors for colon cancer. On a yearly basis, your health care provider may provide home test kits to check for hidden blood in the stool. Use of a small camera at the end of a tube to directly examine the colon (sigmoidoscopy or colonoscopy) can detect the earliest forms of colorectal cancer. Talk to your health care provider about this at age 84, when routine screening begins. Direct exam of the colon should be repeated every 5-10 years through age 45, unless early forms of precancerous polyps or small growths are found.  People who are at an increased risk for hepatitis B should be screened for this virus. You are considered at high risk for hepatitis B if:  You were born in a country where hepatitis B occurs often. Talk with your health care provider about which countries are considered high risk.  Your parents were  born in a high-risk country and you have not received a shot to protect against hepatitis B (hepatitis B vaccine).  You have HIV or AIDS.  You use needles to inject street drugs.  You live with, or have sex with, someone who has hepatitis B.  You are a man who has sex with other men (MSM).  You get hemodialysis treatment.  You take certain medicines for conditions such as cancer, organ transplantation, and autoimmune conditions.  Hepatitis C blood testing is recommended for all people born from [birth year] through 1965 and any individual with known risks for hepatitis C.  Practice safe sex. Use condoms and avoid high-risk sexual practices to reduce the spread of sexually transmitted infections (STIs). STIs include gonorrhea, chlamydia, syphilis, trichomonas, herpes, HPV, and human immunodeficiency virus (HIV). Herpes, HIV, and HPV are viral illnesses that have no cure. They can result in disability, cancer, and death.  If you are at risk of  being infected with HIV, it is recommended that you take a prescription medicine daily to prevent HIV infection. This is called preexposure prophylaxis (PrEP). You are considered at risk if:  You are a man who has sex with other men (MSM) and have other risk factors.  You are a heterosexual man, are sexually active, and are at increased risk for HIV infection.  You take drugs by injection.  You are sexually active with a partner who has HIV.  Talk with your health care provider about whether you are at high risk of being infected with HIV. If you choose to begin PrEP, you should first be tested for HIV. You should then be tested every 3 months for as long as you are taking PrEP.  A one-time screening for abdominal aortic aneurysm (AAA) and surgical repair of large AAAs by ultrasound are recommended for men ages 47 to 23 years who are current or former smokers.  Healthy men should no longer receive prostate-specific antigen (PSA) blood tests as part of  routine cancer screening. Talk with your health care provider about prostate cancer screening.  Testicular cancer screening is not recommended for adult males who have no symptoms. Screening includes self-exam, a health care provider exam, and other screening tests. Consult with your health care provider about any symptoms you have or any concerns you have about testicular cancer.  Use sunscreen. Apply sunscreen liberally and repeatedly throughout the day. You should seek shade when your shadow is shorter than you. Protect yourself by wearing long sleeves, pants, a wide-brimmed hat, and sunglasses year round, whenever you are outdoors.  Once a month, do a whole-body skin exam, using a mirror to look at the skin on your back. Tell your health care provider about new moles, moles that have irregular borders, moles that are larger than a pencil eraser, or moles that have changed in shape or color.  Stay current with required vaccines (immunizations).  Influenza vaccine. All adults should be immunized every year.  Tetanus, diphtheria, and acellular pertussis (Td, Tdap) vaccine. An adult who has not previously received Tdap or who does not know his vaccine status should receive 1 dose of Tdap. This initial dose should be followed by tetanus and diphtheria toxoids (Td) booster doses every 10 years. Adults with an unknown or incomplete history of completing a 3-dose immunization series with Td-containing vaccines should begin or complete a primary immunization series including a Tdap dose. Adults should receive a Td booster every 10 years.  Varicella vaccine. An adult without evidence of immunity to varicella should receive 2 doses or a second dose if he has previously received 1 dose.  Human papillomavirus (HPV) vaccine. Males aged 55-21 years who have not received the vaccine previously should receive the 3-dose series. Males aged 22-26 years may be immunized. Immunization is recommended through the age  of 62 years for any male who has sex with males and did not get any or all doses earlier. Immunization is recommended for any person with an immunocompromised condition through the age of 47 years if he did not get any or all doses earlier. During the 3-dose series, the second dose should be obtained 4-8 weeks after the first dose. The third dose should be obtained 24 weeks after the first dose and 16 weeks after the second dose.  Zoster vaccine. One dose is recommended for adults aged 58 years or older unless certain conditions are present.  Measles, mumps, and rubella (MMR) vaccine. Adults born before [birth year] generally  are considered immune to measles and mumps. Adults born in [birth year] or later should have 1 or more doses of MMR vaccine unless there is a contraindication to the vaccine or there is laboratory evidence of immunity to each of the three diseases. A routine second dose of MMR vaccine should be obtained at least 28 days after the first dose for students attending postsecondary schools, health care workers, or international travelers. People who received inactivated measles vaccine or an unknown type of measles vaccine during 1963-1967 should receive 2 doses of MMR vaccine. People who received inactivated mumps vaccine or an unknown type of mumps vaccine before 1979 and are at high risk for mumps infection should consider immunization with 2 doses of MMR vaccine. Unvaccinated health care workers born before [birth year] who lack laboratory evidence of measles, mumps, or rubella immunity or laboratory confirmation of disease should consider measles and mumps immunization with 2 doses of MMR vaccine or rubella immunization with 1 dose of MMR vaccine.  Pneumococcal 13-valent conjugate (PCV13) vaccine. When indicated, a person who is uncertain of his immunization history and has no record of immunization should receive the PCV13 vaccine. An adult aged 60 years or older who has certain medical conditions and has not  been previously immunized should receive 1 dose of PCV13 vaccine. This PCV13 should be followed with a dose of pneumococcal polysaccharide (PPSV23) vaccine. The PPSV23 vaccine dose should be obtained at least 8 weeks after the dose of PCV13 vaccine. An adult aged 61 years or older who has certain medical conditions and previously received 1 or more doses of PPSV23 vaccine should receive 1 dose of PCV13. The PCV13 vaccine dose should be obtained 1 or more years after the last PPSV23 vaccine dose.  Pneumococcal polysaccharide (PPSV23) vaccine. When PCV13 is also indicated, PCV13 should be obtained first. All adults aged 72 years and older should be immunized. An adult younger than age 59 years who has certain medical conditions should be immunized. Any person who resides in a nursing home or long-term care facility should be immunized. An adult smoker should be immunized. People with an immunocompromised condition and certain other conditions should receive both PCV13 and PPSV23 vaccines. People with human immunodeficiency virus (HIV) infection should be immunized as soon as possible after diagnosis. Immunization during chemotherapy or radiation therapy should be avoided. Routine use of PPSV23 vaccine is not recommended for American Indians, [place] Natives, or people younger than 65 years unless there are medical conditions that require PPSV23 vaccine. When indicated, people who have unknown immunization and have no record of immunization should receive PPSV23 vaccine. One-time revaccination 5 years after the first dose of PPSV23 is recommended for people aged 19-64 years who have chronic kidney failure, nephrotic syndrome, asplenia, or immunocompromised conditions. People who received 1-2 doses of PPSV23 before age 50 years should receive another dose of PPSV23 vaccine at age 89 years or later if at least 5 years have passed since the previous dose. Doses of PPSV23 are not needed for people immunized with PPSV23 at  or after age 5 years.  Meningococcal vaccine. Adults with asplenia or persistent complement component deficiencies should receive 2 doses of quadrivalent meningococcal conjugate (MenACWY-D) vaccine. The doses should be obtained at least 2 months apart. Microbiologists working with certain meningococcal bacteria, [employer] recruits, people at risk during an outbreak, and people who travel to or live in countries with a high rate of meningitis should be immunized. A first-year college student up through age 20 years who is  living in a residence hall should receive a dose if he did not receive a dose on or after his 16th birthday. Adults who have certain high-risk conditions should receive one or more doses of vaccine.  Hepatitis A vaccine. Adults who wish to be protected from this disease, have certain high-risk conditions, work with hepatitis A-infected animals, work in hepatitis A research labs, or travel to or work in countries with a high rate of hepatitis A should be immunized. Adults who were previously unvaccinated and who anticipate close contact with an international adoptee during the first 60 days after arrival in the [place] from a country with a high rate of hepatitis A should be immunized.  Hepatitis B vaccine. Adults should be immunized if they wish to be protected from this disease, have certain high-risk conditions, may be exposed to blood or other infectious body fluids, are household contacts or sex partners of hepatitis B positive people, are clients or workers in certain care facilities, or travel to or work in countries with a high rate of hepatitis B.  Haemophilus influenzae type b (Hib) vaccine. A previously unvaccinated person with asplenia or sickle cell disease or having a scheduled splenectomy should receive 1 dose of Hib vaccine. Regardless of previous immunization, a recipient of a hematopoietic stem cell transplant should receive a 3-dose series 6-12 months after his  successful transplant. Hib vaccine is not recommended for adults with HIV infection. Preventive Service / Frequency Ages 7 to 47  Blood pressure check.** / Every 1 to 2 years.  Lipid and cholesterol check.** / Every 5 years beginning at age 64.  Hepatitis C blood test.** / For any individual with known risks for hepatitis C.  Skin self-exam. / Monthly.  Influenza vaccine. / Every year.  Tetanus, diphtheria, and acellular pertussis (Tdap, Td) vaccine.** / Consult your health care provider. 1 dose of Td every 10 years.  Varicella vaccine.** / Consult your health care provider.  HPV vaccine. / 3 doses over 6 months, if 81 or younger.  Measles, mumps, rubella (MMR) vaccine.** / You need at least 1 dose of MMR if you were born in 1957 or later. You may also need a second dose.  Pneumococcal 13-valent conjugate (PCV13) vaccine.** / Consult your health care provider.  Pneumococcal polysaccharide (PPSV23) vaccine.** / 1 to 2 doses if you smoke cigarettes or if you have certain conditions.  Meningococcal vaccine.** / 1 dose if you are age 87 to 85 years and a Market researcher living in a residence hall, or have one of several medical conditions. You may also need additional booster doses.  Hepatitis A vaccine.** / Consult your health care provider.  Hepatitis B vaccine.** / Consult your health care provider.  Haemophilus influenzae type b (Hib) vaccine.** / Consult your health care provider. Ages 18 to 67  Blood pressure check.** / Every 1 to 2 years.  Lipid and cholesterol check.** / Every 5 years beginning at age 52.  Lung cancer screening. / Every year if you are aged 55-80 years and have a 30-pack-year history of smoking and currently smoke or have quit within the past 15 years. Yearly screening is stopped once you have quit smoking for at least 15 years or develop a health problem that would prevent you from having lung cancer treatment.  Fecal occult blood test (FOBT)  of stool. / Every year beginning at age 75 and continuing until age 24. You may not have to do this test if you get a colonoscopy  every 10 years.  Flexible sigmoidoscopy** or colonoscopy.** / Every 5 years for a flexible sigmoidoscopy or every 10 years for a colonoscopy beginning at age 71 and continuing until age 31.  Hepatitis C blood test.** / For all people born from [birth year] through 1965 and any individual with known risks for hepatitis C.  Skin self-exam. / Monthly.  Influenza vaccine. / Every year.  Tetanus, diphtheria, and acellular pertussis (Tdap/Td) vaccine.** / Consult your health care provider. 1 dose of Td every 10 years.  Varicella vaccine.** / Consult your health care provider.  Zoster vaccine.** / 1 dose for adults aged 18 years or older.  Measles, mumps, rubella (MMR) vaccine.** / You need at least 1 dose of MMR if you were born in 1955 or later. You may also need a second dose.  Pneumococcal 13-valent conjugate (PCV13) vaccine.** / Consult your health care provider.  Pneumococcal polysaccharide (PPSV23) vaccine.** / 1 to 2 doses if you smoke cigarettes or if you have certain conditions.  Meningococcal vaccine.** / Consult your health care provider.  Hepatitis A vaccine.** / Consult your health care provider.  Hepatitis B vaccine.** / Consult your health care provider.  Haemophilus influenzae type b (Hib) vaccine.** / Consult your health care provider. Ages 79 and over  Blood pressure check.** / Every 1 to 2 years.  Lipid and cholesterol check.**/ Every 5 years beginning at age 77.  Lung cancer screening. / Every year if you are aged 22-80 years and have a 30-pack-year history of smoking and currently smoke or have quit within the past 15 years. Yearly screening is stopped once you have quit smoking for at least 15 years or develop a health problem that would prevent you from having lung cancer treatment.  Fecal occult blood test (FOBT) of stool. / Every year  beginning at age 55 and continuing until age 32. You may not have to do this test if you get a colonoscopy every 10 years.  Flexible sigmoidoscopy** or colonoscopy.** / Every 5 years for a flexible sigmoidoscopy or every 10 years for a colonoscopy beginning at age 62 and continuing until age 78.  Hepatitis C blood test.** / For all people born from [birth year] through 1963 and any individual with known risks for hepatitis C.  Abdominal aortic aneurysm (AAA) screening.** / A one-time screening for ages 95 to 42 years who are current or former smokers.  Skin self-exam. / Monthly.  Influenza vaccine. / Every year.  Tetanus, diphtheria, and acellular pertussis (Tdap/Td) vaccine.** / 1 dose of Td every 10 years.  Varicella vaccine.** / Consult your health care provider.  Zoster vaccine.** / 1 dose for adults aged 82 years or older.  Pneumococcal 13-valent conjugate (PCV13) vaccine.** / Consult your health care provider.  Pneumococcal polysaccharide (PPSV23) vaccine.** / 1 dose for all adults aged 4 years and older.  Meningococcal vaccine.** / Consult your health care provider.  Hepatitis A vaccine.** / Consult your health care provider.  Hepatitis B vaccine.** / Consult your health care provider.  Haemophilus influenzae type b (Hib) vaccine.** / Consult your health care provider. **Family history and personal history of risk and conditions may change your health care provider's recommendations. Document Released: 06/26/2001 Document Revised: 05/05/2013 Document Reviewed: 09/25/2010 [facility] Patient Information 2015 [place], [place]. This information is not intended to replace advice given to you by your health care provider. Make sure you discuss any questions you have with your health care provider.

## 2014-11-05 ENCOUNTER — Telehealth: Payer: Self-pay | Admitting: Medical

## 2014-11-05 NOTE — Telephone Encounter (Signed)
Deleted vaccine and closed but ask lpn to add proper one.

## 2014-11-05 NOTE — Telephone Encounter (Signed)
Note lpn on vaccine.

## 2014-11-08 ENCOUNTER — Other Ambulatory Visit: Payer: Self-pay | Admitting: Medical

## 2014-11-08 DIAGNOSIS — Z0000 Encounter for general adult medical examination without abnormal findings: Secondary | ICD-10-CM

## 2014-12-14 ENCOUNTER — Other Ambulatory Visit (INDEPENDENT_AMBULATORY_CARE_PROVIDER_SITE_OTHER): Payer: Medicare Other

## 2014-12-14 ENCOUNTER — Ambulatory Visit (HOSPITAL_BASED_OUTPATIENT_CLINIC_OR_DEPARTMENT_OTHER)
Admission: RE | Admit: 2014-12-14 | Discharge: 2014-12-14 | Disposition: A | Discharge status: Home / Self Care | Payer: Medicare Other | Source: Ambulatory Visit | Attending: Medical | Admitting: Medical

## 2014-12-14 DIAGNOSIS — I77811 Abdominal aortic ectasia: Secondary | ICD-10-CM | POA: Diagnosis not present

## 2014-12-14 DIAGNOSIS — Z136 Encounter for screening for cardiovascular disorders: Secondary | ICD-10-CM | POA: Diagnosis present

## 2014-12-14 DIAGNOSIS — E785 Hyperlipidemia, unspecified: Secondary | ICD-10-CM

## 2014-12-14 DIAGNOSIS — E1165 Type 2 diabetes mellitus with hyperglycemia: Secondary | ICD-10-CM | POA: Diagnosis not present

## 2014-12-14 DIAGNOSIS — IMO0002 Reserved for concepts with insufficient information to code with codable children: Secondary | ICD-10-CM

## 2014-12-14 LAB — LIPID PANEL
CHOL/HDL RATIO: 5
CHOLESTEROL: 129 mg/dL (ref 0–200)
HDL: 26.8 mg/dL — AB (ref >39.00)
LDL CALC: 77 mg/dL (ref 0–99)
NonHDL: 102.38
Triglycerides: 125 mg/dL (ref 0.0–149.0)
VLDL: 25 mg/dL (ref 0.0–40.0)

## 2014-12-14 LAB — BASIC METABOLIC PANEL
BUN: 14 mg/dL (ref 6–23)
CALCIUM: 9.3 mg/dL (ref 8.4–10.5)
CO2: 29 mEq/L (ref 19–32)
Chloride: 105 mEq/L (ref 96–112)
Creatinine, Ser: 0.8 mg/dL (ref 0.40–1.50)
GFR: 102.55 mL/min (ref >60.00)
Glucose, Bld: 114 mg/dL — ABNORMAL HIGH (ref 70–99)
POTASSIUM: 4.4 meq/L (ref 3.5–5.1)
SODIUM: 139 meq/L (ref 135–145)

## 2014-12-14 LAB — HEMOGLOBIN A1C: Hgb A1c MFr Bld: 7 % — ABNORMAL HIGH (ref 4.6–6.5)

## 2014-12-15 ENCOUNTER — Other Ambulatory Visit: Payer: Medicare Other

## 2014-12-16 ENCOUNTER — Telehealth: Payer: Self-pay | Admitting: Internal Medicine

## 2014-12-16 NOTE — Telephone Encounter (Signed)
Spoke with patient regarding sonogram

## 2014-12-16 NOTE — Telephone Encounter (Signed)
Pt returning your call regarding results

## 2014-12-16 NOTE — Telephone Encounter (Signed)
Called patient back no answer 

## 2014-12-16 NOTE — Telephone Encounter (Signed)
Relation to pt: self  Call back number: best [phone]   Reason for call:  Patient inquiring about sono results. As per patient please call mobile and home #. Thank you

## 2014-12-21 LAB — HM DIABETES EYE EXAM

## 2014-12-22 ENCOUNTER — Ambulatory Visit (INDEPENDENT_AMBULATORY_CARE_PROVIDER_SITE_OTHER): Payer: Medicare Other | Admitting: Endocrinology

## 2014-12-22 ENCOUNTER — Encounter: Payer: Self-pay | Admitting: Endocrinology

## 2014-12-22 ENCOUNTER — Other Ambulatory Visit: Payer: Self-pay | Admitting: *Deleted

## 2014-12-22 VITALS — BP 130/70 | HR 76 | Temp 97.7°F | Resp 16 | Ht 70.0 in | Wt 176.0 lb

## 2014-12-22 DIAGNOSIS — E1165 Type 2 diabetes mellitus with hyperglycemia: Secondary | ICD-10-CM

## 2014-12-22 DIAGNOSIS — E785 Hyperlipidemia, unspecified: Secondary | ICD-10-CM

## 2014-12-22 DIAGNOSIS — IMO0002 Reserved for concepts with insufficient information to code with codable children: Secondary | ICD-10-CM

## 2014-12-22 MED ORDER — INSULIN GLARGINE 300 UNIT/ML ~~LOC~~ SOPN: 40.0000 [IU] | PEN_INJECTOR | Freq: Every day | SUBCUTANEOUS | Status: DC

## 2014-12-22 NOTE — Progress Notes (Signed)
Patient ID: Manuel Petty, male   DOB: 1946-03-13, 69 y.o.   MRN: [id number]   Reason for Appointment: Diabetes follow-up   History of Present Illness   Diagnosis: Type 2 DIABETES MELITUS, date of diagnosis: 2007   Previous history: He had been on large doses of Lantus before he was given Victoza in addition and this improved his blood sugar control and reduced his insulin requirement However he subsequently started requiring mealtime coverage with large meals also  RECENT history:  His A1c is relatively higher at 7% He thinks this is primarily from his being less active in the summer and has gained a couple of pounds also He continues to be taking a regimen of basal bolus insulin and Victoza; more recently he is taking only 1.2 mg Victoza because of the cost of the 1.8 mg Currently on twice a day Lantus insulin, has still not tried Toujeo as discussed before with the free coupon Did not bring his monitor for download today and difficult to know what his blood sugar patterns are  Current blood sugar patterns and problems identified:  Fasting readings are generally near normal without overnight hypoglycemia  He still likes to adjust his Lantus a couple of units if eating a larger meal in the evening  He now says that he tends to forget to take his Humalog sometimes in the evenings before supper causing higher readings up to about 240  He will take Humalog at lunch only if eating out or eating foods like pasta.  Does not take any Humalog for a sandwich at lunch  He thinks his blood sugar may be high after meals depending on his diet but difficult to know how often since he did not bring his monitor  Oral hypoglycemic drugs: Metformin        Side effects from medications: None Insulin regimen: Lantus 28 units in the morning and 10 at bedtime    Humalog 0-4 acb, 6-8 units acs Proper timing of medications in relation to meals: Yes.          Monitors blood glucose:   3-4 times a day.    Glucometer: One Touch.          Blood Glucose readings: Glucometer not downloaded  Recent fasting readings 91-120, at suppertime as low as 70.  After meals usually about 150 but as high as 240        Meals:2- 3 meals per day. Lunch 2-3 pm; dinner 6-7 pm        Physical activity: exercise: 30 min walking in the mornings       Dietician visit: Most recent: 11/1217           Complications: are: None     Wt Readings from Last 3 Encounters:  12/22/14 176 lb (79.833 kg)  11/04/14 174 lb 12.8 oz (79.289 kg)  09/21/14 172 lb 6.4 oz (78.2 kg)      Lab Results  Component Value Date   HGBA1C 7.0* 12/14/2014   HGBA1C 6.7* 09/21/2014   HGBA1C 6.9* 06/23/2014   Lab Results  Component Value Date   MICROALBUR 15.8* 09/21/2014   LDLCALC 77 12/14/2014   CREATININE 0.80 12/14/2014     HYPERLIPIDEMIA:  Previously taking lovastatin but now only on niacin 1000 mg a day with adequate control of LDL  HDL has been in the 20s previously along with small LDL particles HDL has increased slightly as of 5/15 Particle number was below 1000 in 5/15 He  is tolerating niacin 750 twice a day amended dose was increased because of low HDL  Lab Results  Component Value Date   CHOL 129 12/14/2014   HDL 26.80* 12/14/2014   LDLCALC 77 12/14/2014   LDLDIRECT 45.7 05/31/2009   TRIG 125.0 12/14/2014   CHOLHDL 5 12/14/2014      Medication List       This list is accurate as of: 12/22/14 12:34 PM.  Always use your most recent med list.               ACCU-CHEK AVIVA PLUS W/DEVICE Kit  1 each by Does not apply route daily.     ACCU-CHEK SOFTCLIX LANCET DEV Kit  1 each by Does not apply route daily.     accu-chek softclix lancets  Use as instructed     aspirin 81 MG tablet  Take 81 mg by mouth daily.     B-D ULTRAFINE III SHORT PEN 31G X 8 MM Misc  Generic drug:  Insulin Pen Needle  Use 4 times daily as  directed     glucose blood test strip  Commonly known as:  ACCU-CHEK  AVIVA PLUS  Use as instructed to check blood sugars 5 times per day dx code E11.65     HUMALOG KWIKPEN 100 UNIT/ML KiwkPen  Generic drug:  insulin lispro  Inject subcutaneously 8  units daily     Insulin Glargine 100 UNIT/ML Solostar Pen  Commonly known as:  LANTUS  Inject 28 units in am and 12 units in pm     Insulin Glargine 300 UNIT/ML Sopn  Commonly known as:  TOUJEO SOLOSTAR  Inject 40 Units into the skin daily.     lisinopril 5 MG tablet  Commonly known as:  PRINIVIL,ZESTRIL  Take 1 tablet by mouth  daily     metFORMIN 1000 MG tablet  Commonly known as:  GLUCOPHAGE  Take 1 tablet by mouth  twice a day with meals     niacin 500 MG tablet  Commonly known as:  SLO-NIACIN  Take 500 mg by mouth 2 (two) times daily.     VICTOZA 18 MG/3ML Sopn  Generic drug:  Liraglutide  Inject subcutaneously 1.38m daily        Allergies: No Known Allergies  Past Medical History  Diagnosis Date  . Diabetes mellitus 2,000  . HTN (hypertension)   . Hyperlipidemia   . Renal cancer     1992, has 1 kidney ; does not see urology any more  . Erectile dysfunction   . Personal history of colonic adenomas 07/18/2009    Past Surgical History  Procedure Laterality Date  . Nephrectomy  1992    Family History  Problem Relation Age of Onset  . Diabetes Father   . Hypertension Father   . CAD Father     F (MI age 67, Brother in [place] [phone] . Colon cancer Neg Hx   . Prostate cancer Brother     dx age 67 . Stroke Neg Hx     Social History:  reports that he has quit smoking. He has quit using smokeless tobacco. He reports that he drinks alcohol. He reports that he does not use illicit drugs.  Review of Systems -    He has only mild hypertension previously and is only on 5 mg lisinopril   He monitors BP at home, generally readings 120-130/70s; does have some white coat syndrome  His potassium has been consistently normal now, high on one  occasion  Lab Results  Component Value Date    CREATININE 0.80 12/14/2014   BUN 14 12/14/2014   NA 139 12/14/2014   K 4.4 12/14/2014   CL 105 12/14/2014   CO2 29 12/14/2014    Eye exams: Annual 8/16  Diabetic foot exam was done in 06/2014   Examination:   BP 130/70 mmHg  Pulse 76  Temp(Src) 97.7 F (36.5 C)  Resp 16  Ht _0  (1.778 m)  Wt 176 lb (79.833 kg)  BMI 25.25 kg/m2  SpO2 97%  Body mass index is 25.25 kg/(m^2).   Repeat blood pressure lower than initial blood pressure No edema.  Assesment/Plan:   Diabetes type 2, nonobese: Overall has fairly good blood sugars with A1c at 7% See history of present illness for detailed discussion office current blood sugar patterns, problems identified and current management  His main difficulty is getting postprandial blood sugars under control with not taking Humalog consistently and probably not with every meal when he needs it Although he was given the option of using the V-go pump for better compliance she still does not want to do this He is also concerned about the cost of his medications and insulin with his doughnut hole   Discussed adjustment of Humalog based on meal size and also postprandial pattern; he does need to check more readings after lunch consistently  He will try to be better compliant with taking suppertime insulin consistently  Also discussed trying Toujeo and he may be able to use this once a day instead of twice a day Lantus  Discussed that it may be increased if fasting blood sugars tend to be higher with about 38-40 units once a day  Encouraged him to resume walking or other exercise  He will go back to 1.8 mg Victoza when he is out of the donut hole   HYPERLIPIDEMIA:  HDL has been in the 20s  along with small LDL particles LDL particle number is well controlled with niacin alone and LDL has been below 100 HDL slightly higher than before on this visit  He is tolerating niacin 500 mg twice a day  He is reluctant to start a statin  prophylactically  Hypertension: This is very mild.  He has good readings at home and no mitral them in urea Continued monitoring at home  Counseling time on subjects discussed above is over 50% of today's 25 minute visit   Chardai Gangemi 12/22/2014, 12:34 PM

## 2014-12-22 NOTE — Patient Instructions (Addendum)
Check blood sugars on waking up .. 2-3 .. times a week  Also check blood sugars about 2 hours after a meal and do this after different meals by rotation  Recommended blood sugar levels on waking up is 90-130 and about 2 hours after meal is 140-180 Please bring blood sugar monitor to each visit.  

## 2014-12-31 ENCOUNTER — Encounter: Payer: Self-pay | Admitting: *Deleted

## 2015-03-14 ENCOUNTER — Telehealth: Payer: Self-pay | Admitting: Endocrinology

## 2015-03-14 NOTE — Telephone Encounter (Signed)
[facility] would [facility] .

## 2015-03-15 ENCOUNTER — Other Ambulatory Visit: Payer: Self-pay | Admitting: Endocrinology

## 2015-03-15 NOTE — Telephone Encounter (Signed)
Called pt and advised him that labs are in the system and we are in the same system. Advised him that the lab there is able to see to draw them. Pt voiced understanding.

## 2015-03-17 ENCOUNTER — Other Ambulatory Visit (INDEPENDENT_AMBULATORY_CARE_PROVIDER_SITE_OTHER): Payer: Medicare Other

## 2015-03-17 DIAGNOSIS — E1165 Type 2 diabetes mellitus with hyperglycemia: Secondary | ICD-10-CM | POA: Diagnosis not present

## 2015-03-17 DIAGNOSIS — IMO0002 Reserved for concepts with insufficient information to code with codable children: Secondary | ICD-10-CM

## 2015-03-17 LAB — COMPREHENSIVE METABOLIC PANEL
ALK PHOS: 81 U/L (ref 39–117)
ALT: 38 U/L (ref 0–53)
AST: 22 U/L (ref 0–37)
Albumin: 4.1 g/dL (ref 3.5–5.2)
BILIRUBIN TOTAL: 0.4 mg/dL (ref 0.2–1.2)
BUN: 12 mg/dL (ref 6–23)
CO2: 29 meq/L (ref 19–32)
Calcium: 9.3 mg/dL (ref 8.4–10.5)
Chloride: 104 mEq/L (ref 96–112)
Creatinine, Ser: 0.74 mg/dL (ref 0.40–1.50)
GFR: 112.12 mL/min (ref >60.00)
GLUCOSE: 164 mg/dL — AB (ref 70–99)
Potassium: 4.2 mEq/L (ref 3.5–5.1)
SODIUM: 139 meq/L (ref 135–145)
TOTAL PROTEIN: 6.9 g/dL (ref 6.0–8.3)

## 2015-03-17 LAB — HEMOGLOBIN A1C: Hgb A1c MFr Bld: 7.3 % — ABNORMAL HIGH (ref 4.6–6.5)

## 2015-03-28 ENCOUNTER — Ambulatory Visit (INDEPENDENT_AMBULATORY_CARE_PROVIDER_SITE_OTHER): Payer: Medicare Other | Admitting: Endocrinology

## 2015-03-28 ENCOUNTER — Encounter: Payer: Self-pay | Admitting: Endocrinology

## 2015-03-28 VITALS — BP 138/80 | HR 67 | Temp 98.3°F | Resp 14 | Ht 70.0 in | Wt 177.6 lb

## 2015-03-28 DIAGNOSIS — E1165 Type 2 diabetes mellitus with hyperglycemia: Secondary | ICD-10-CM | POA: Diagnosis not present

## 2015-03-28 DIAGNOSIS — Z23 Encounter for immunization: Secondary | ICD-10-CM | POA: Diagnosis not present

## 2015-03-28 DIAGNOSIS — R030 Elevated blood-pressure reading, without diagnosis of hypertension: Secondary | ICD-10-CM | POA: Diagnosis not present

## 2015-03-28 DIAGNOSIS — Z794 Long term (current) use of insulin: Secondary | ICD-10-CM

## 2015-03-28 NOTE — Patient Instructions (Signed)
Toujeo 40 units in am only  Always take Humalog at supper based on meal type  Walk daily  Check blood sugars on waking up 3-4  times a week Also check blood sugars about 2 hours after a meal and do this after different meals by rotation  Recommended blood sugar levels on waking up is 90-130 and about 2 hours after meal is 130-160  Please bring your blood sugar monitor to each visit, thank you

## 2015-03-28 NOTE — Progress Notes (Signed)
Patient ID: Manuel Petty, male   DOB: 06-09-1945, 69 y.o.   MRN: [id number]   Reason for Appointment: Diabetes follow-up   History of Present Illness   Diagnosis: Type 2 DIABETES MELITUS, date of diagnosis: 2007   Previous history: He had been on large doses of Lantus before he was given Victoza in addition and this improved his blood sugar control and reduced his insulin requirement However he subsequently started requiring mealtime coverage with large meals also  RECENT history:  His A1c is relatively higher at 7.3% He has not been taking Victoza since his last visit because of cost However he has been switched to Toujeo from Lantus to Levemir was told to try this once a day but is taking this twice a day as before  Current blood sugar patterns and problems identified:  Fasting readings are generally near normal at least recently and previously may be higher because of carryover from the night before  Also he is doing Toujeo twice a day without overnight hypoglycemia, lowest reading overnight is 70  He again says that he tends to forget to take his Humalog in the evenings before supper causing higher readings up to 270  He does not adjust the dose based on what he is eating and difficult to know which of the high readings are related to larger meals or to noncompliance with mealtime insulin  He will take Humalog at lunch only if eating out or eating foods like pasta.  Does not take any Humalog for a sandwich at lunch  He thinks his blood sugar may be high after meals depending on his diet but difficult to know how often since he did not bring his monitor  Oral hypoglycemic drugs: Metformin        Side effects from medications: None Insulin regimen: Toujeo 28 units in the morning and 12 at bedtime    Humalog 0-4 acb, 0-8 acl, 6-8 units acs Proper timing of medications in relation to meals: Yes.          Monitors blood glucose:  3-4 times a day.    Glucometer:  One Touch.          Blood Glucose readings: Glucometer   Mean values apply above for all meters except median for One Touch  PRE-MEAL Fasting Lunch Dinner Bedtime Overall  Glucose range: 86-135 145-160 87-204 84-270 70-270  Mean/median: 135   180 155          Meals:2- 3 meals per day. Lunch 2-3 pm; dinner 6-7 pm.         Exercise: He says he is walking only about 3000 steps a day.  No formal exercise.  Does not feel motivated  Dietician visit: Most recent: 0/7622           Complications: are: None     Wt Readings from Last 3 Encounters:  03/28/15 177 lb 9.6 oz (80.559 kg)  12/22/14 176 lb (79.833 kg)  11/04/14 174 lb 12.8 oz (79.289 kg)      Lab Results  Component Value Date   HGBA1C 7.3* 03/17/2015   HGBA1C 7.0* 12/14/2014   HGBA1C 6.7* 09/21/2014   Lab Results  Component Value Date   MICROALBUR 15.8* 09/21/2014   LDLCALC 77 12/14/2014   CREATININE 0.74 03/17/2015     HYPERLIPIDEMIA:  Previously taking lovastatin but now only on niacin 1000 mg a day with adequate control of LDL  HDL has been in the 20s previously along with small  LDL particles HDL has increased slightly as of 5/15 Particle number was below 1000 in 5/15 He is tolerating niacin 750 twice a day amended dose was increased because of low HDL  Lab Results  Component Value Date   CHOL 129 12/14/2014   HDL 26.80* 12/14/2014   LDLCALC 77 12/14/2014   LDLDIRECT 45.7 05/31/2009   TRIG 125.0 12/14/2014   CHOLHDL 5 12/14/2014      Medication List       This list is accurate as of: 03/28/15  5:12 PM.  Always use your most recent med list.               ACCU-CHEK AVIVA PLUS W/DEVICE Kit  1 each by Does not apply route daily.     ACCU-CHEK SOFTCLIX LANCET DEV Kit  1 each by Does not apply route daily.     accu-chek softclix lancets  Use as instructed     aspirin 81 MG tablet  Take 81 mg by mouth daily.     B-D ULTRAFINE III SHORT PEN 31G X 8 MM Misc  Generic drug:  Insulin Pen Needle  Use  4 times daily as  directed     glucose blood test strip  Commonly known as:  ACCU-CHEK AVIVA PLUS  Use as instructed to check blood sugars 5 times per day dx code E11.65     HUMALOG KWIKPEN 100 UNIT/ML KiwkPen  Generic drug:  insulin lispro  Inject subcutaneously 8  units daily     Insulin Glargine 300 UNIT/ML Sopn  Commonly known as:  TOUJEO SOLOSTAR  Inject 40 Units into the skin daily.     lisinopril 5 MG tablet  Commonly known as:  PRINIVIL,ZESTRIL  Take 1 tablet by mouth  daily     metFORMIN 1000 MG tablet  Commonly known as:  GLUCOPHAGE  Take 1 tablet by mouth  twice a day with meals     niacin 500 MG tablet  Commonly known as:  SLO-NIACIN  Take 500 mg by mouth 2 (two) times daily.     VICTOZA 18 MG/3ML Sopn  Generic drug:  Liraglutide  Inject subcutaneously 1.70m daily        Allergies: No Known Allergies  Past Medical History  Diagnosis Date  . [place] [place]  . HTN (hypertension)   . Hyperlipidemia   [place] ; does not see urology any more  . Erectile dysfunction   . Personal history of colonic adenomas 07/18/2009    Past Surgical History  Procedure Laterality Date  . Nephrectomy  1992    Family History  Problem Relation Age of Onset  . Diabetes Father   . Hypertension Father   . CAD Father     F (MI age 67, Brother in [place] [address] . Colon cancer Neg Hx   . Prostate cancer Brother     dx age 67 . Stroke Neg Hx     Social History:  reports that he has quit smoking. He has quit using smokeless tobacco. He reports that he drinks alcohol. He reports that he does not use illicit drugs.  Review of Systems -    He has only mild hypertension  and is only on 5 mg lisinopril   He monitors BP at home, generally readings 120-130/70s; does have some white coat syndrome at times  His potassium has been consistently normal now, high on one occasion  Lab Results  Component Value Date   CREATININE 0.74  03/17/2015   BUN 12 03/17/2015   NA 139 03/17/2015   K 4.2 03/17/2015   CL 104 03/17/2015   CO2 29 03/17/2015    Eye exams: Annual 8/16  Diabetic foot exam was done in 06/2014   Examination:   BP 138/80 mmHg  Pulse 67  Temp(Src) 98.3 F (36.8 C)  Resp 14  Ht 5' 10"  (1.778 m)  Wt 177 lb 9.6 oz (80.559 kg)  BMI 25.48 kg/m2  SpO2 98%  Body mass index is 25.48 kg/(m^2).   No ankle edema  Assesment/Plan:   Diabetes type 2, nonobese: See history of present illness for detailed discussion office current blood sugar patterns, problems identified and current management  Overall has fairly good blood sugars with A1c around 7% although A1c is trending higher recently  His main difficulty is getting postprandial blood sugars under control after supper with not taking Humalog consistently  His blood sugars averaging about 180 after supper but are variable Blood sugars are more difficult to control the.Victoza which he cannot afford at this time His fasting blood sugars are relatively better with Toujeo and has no hypoglycemia overnight However discussed with him that he can take this once a day as it is a 24-hour insulin  He needs to do the following:  Take Humalog consistently before meals especially suppertime or when he is eating significant amount of carbohydrate  More readings after breakfast or lunch and does not need to check blood sugar every morning  Encouraged him to resume walking or other exercise  He will go back to 1.8 mg Victoza when he is out of the donut hole  Try 40 units of Toujeo and if fasting readings are controlled now need to change the dose   Counseling time on subjects discussed above is over 50% of today's 25 minute visit  Influenza vaccine given  [facility] 03/28/2015, 5:12 PM

## 2015-03-29 ENCOUNTER — Telehealth: Payer: Self-pay | Admitting: Endocrinology

## 2015-03-29 ENCOUNTER — Other Ambulatory Visit: Payer: Self-pay | Admitting: *Deleted

## 2015-03-29 MED ORDER — INSULIN GLARGINE 300 UNIT/ML ~~LOC~~ SOPN: 40.0000 [IU] | PEN_INJECTOR | Freq: Every day | SUBCUTANEOUS | Status: DC

## 2015-03-29 NOTE — Telephone Encounter (Signed)
Please call in toujeo to [facility] on [address]

## 2015-03-29 NOTE — Telephone Encounter (Signed)
rx sent

## 2015-05-11 ENCOUNTER — Other Ambulatory Visit: Payer: Self-pay | Admitting: *Deleted

## 2015-05-11 MED ORDER — INSULIN GLARGINE 100 UNIT/ML SOLOSTAR PEN: 40.0000 [IU] | PEN_INJECTOR | Freq: Every day | SUBCUTANEOUS | Status: DC

## 2015-05-29 ENCOUNTER — Other Ambulatory Visit: Payer: Self-pay | Admitting: Endocrinology

## 2015-06-06 ENCOUNTER — Other Ambulatory Visit: Payer: Self-pay | Admitting: Endocrinology

## 2015-06-20 ENCOUNTER — Other Ambulatory Visit (INDEPENDENT_AMBULATORY_CARE_PROVIDER_SITE_OTHER): Payer: PPO

## 2015-06-20 DIAGNOSIS — Z794 Long term (current) use of insulin: Secondary | ICD-10-CM | POA: Diagnosis not present

## 2015-06-20 DIAGNOSIS — E1165 Type 2 diabetes mellitus with hyperglycemia: Secondary | ICD-10-CM | POA: Diagnosis not present

## 2015-06-20 LAB — COMPREHENSIVE METABOLIC PANEL
ALT: 30 U/L (ref 0–53)
AST: 18 U/L (ref 0–37)
Albumin: 4.4 g/dL (ref 3.5–5.2)
Alkaline Phosphatase: 81 U/L (ref 39–117)
BUN: 15 mg/dL (ref 6–23)
CHLORIDE: 104 meq/L (ref 96–112)
CO2: 25 meq/L (ref 19–32)
Calcium: 9.4 mg/dL (ref 8.4–10.5)
Creatinine, Ser: 0.76 mg/dL (ref 0.40–1.50)
GFR: 108.63 mL/min (ref >60.00)
GLUCOSE: 150 mg/dL — AB (ref 70–99)
POTASSIUM: 4.2 meq/L (ref 3.5–5.1)
SODIUM: 138 meq/L (ref 135–145)
TOTAL PROTEIN: 7.1 g/dL (ref 6.0–8.3)
Total Bilirubin: 0.4 mg/dL (ref 0.2–1.2)

## 2015-06-20 LAB — HEMOGLOBIN A1C: Hgb A1c MFr Bld: 7.6 % — ABNORMAL HIGH (ref 4.6–6.5)

## 2015-06-21 ENCOUNTER — Other Ambulatory Visit: Payer: Medicare Other

## 2015-07-05 ENCOUNTER — Encounter: Payer: Self-pay | Admitting: Endocrinology

## 2015-07-05 ENCOUNTER — Encounter: Payer: PPO | Attending: Endocrinology | Admitting: Nutrition

## 2015-07-05 ENCOUNTER — Ambulatory Visit (INDEPENDENT_AMBULATORY_CARE_PROVIDER_SITE_OTHER): Payer: PPO | Admitting: Endocrinology

## 2015-07-05 ENCOUNTER — Other Ambulatory Visit: Payer: Self-pay | Admitting: *Deleted

## 2015-07-05 VITALS — BP 122/78 | HR 73 | Temp 97.8°F | Resp 14 | Ht 70.0 in | Wt 178.6 lb

## 2015-07-05 DIAGNOSIS — E1165 Type 2 diabetes mellitus with hyperglycemia: Secondary | ICD-10-CM | POA: Diagnosis not present

## 2015-07-05 DIAGNOSIS — Z794 Long term (current) use of insulin: Secondary | ICD-10-CM | POA: Insufficient documentation

## 2015-07-05 DIAGNOSIS — IMO0002 Reserved for concepts with insufficient information to code with codable children: Secondary | ICD-10-CM

## 2015-07-05 DIAGNOSIS — E1169 Type 2 diabetes mellitus with other specified complication: Secondary | ICD-10-CM

## 2015-07-05 MED ORDER — LIRAGLUTIDE 18 MG/3ML ~~LOC~~ SOPN: PEN_INJECTOR | SUBCUTANEOUS | Status: DC

## 2015-07-05 NOTE — Progress Notes (Signed)
Patient was instructed on how to fill, apply and use the v-go.  He was given a V-Go 30 starter kit with directions for use, and we filled out an insurance verification form and that was faxed in. He filled a V-Go with Humalog insulin, but did not apply this, because he took his Lantus this AM.  He was told to stop the Lantus and to apply the V-Go tomorrow.  He reported good understanding of this and had not questions.   He will take 2-3 button presses before each meal.  He understands that each button press delivers 2 units of insulin.   He was given a sheet to record his blood sugars, and told to test ac and HS.  He reported good understanding of this, and had no final questions.  He will see Dr. Dwyane Dee on Monday.

## 2015-07-05 NOTE — Patient Instructions (Signed)
Click 2 times for small meals and 3-4x for larger meals on V-go  Exercise  Start Victoza at 0.6 for 3 days then 1.2

## 2015-07-05 NOTE — Progress Notes (Signed)
Patient ID: Manuel Petty, male   DOB: 08-Nov-1945, 70 y.o.   MRN: [id number]   Reason for Appointment: Diabetes follow-up   History of Present Illness   Diagnosis: Type 2 DIABETES MELITUS, date of diagnosis: 2007   Previous history: He had been on large doses of Lantus before he was given Victoza in addition and this improved his blood sugar control and reduced his insulin requirement However he subsequently started requiring mealtime coverage with large meals also  RECENT history:  Insulin regimen: Lantus 28 units in the morning and 12 at bedtime    Humalog 0-4 acb, 0-8 acl, 8-10 units acs  His A1c is relatively higher at 7.6% He has not been taking Victoza for quite some time However he has gone back to taking Lantus instead of the Toujeo he was doing, I did not understand the need to continue Toujeo  Current blood sugar patterns and problems identified:  Fasting readings are generally near normal including occasional hypoglycemia  His blood sugars are generally higher later in the day and may be better only at suppertime if he is taking some Humalog at lunchtime particularly for higher readings  He has taken a little more Novolog at suppertime but blood sugars are not consistently controlled after supper  He thinks he is eating more because of not taking Victoza  He still is not exercising as directed  He does not take insulin for breakfast and lunch especially the blood sugar is normal but blood sugars may still be higher up to 200 at lunch or supper  Oral hypoglycemic drugs: Metformin 1 g twice a day        Side effects from medications: None  Proper timing of medications in relation to meals: Yes.          Monitors blood glucose:  3-4 times a day.    Glucometer: Accucheck          Blood Glucose readings:    Mean values apply above for all meters except median for One Touch  PRE-MEAL Fasting Lunch Dinner Bedtime Overall  Glucose range:  73-133      63-235    Mean/median:  113      141+/-45    POST-MEAL PC Breakfast PC Lunch PC Dinner  Glucose range:  99-186   74-189   140-277   Mean/median:             Meals:2- 3 meals per day. Lunch 2-3 pm; dinner 6-7 pm.         Exercise: He says he is walking only about 3000 steps a day.   Dietician visit: Most recent: 10/43           Complications: are: None     Wt Readings from Last 3 Encounters:  07/05/15 178 lb 9.6 oz (81.012 kg)  03/28/15 177 lb 9.6 oz (80.559 kg)  12/22/14 176 lb (79.833 kg)      Lab Results  Component Value Date   HGBA1C 7.6* 06/20/2015   HGBA1C 7.3* 03/17/2015   HGBA1C 7.0* 12/14/2014   Lab Results  Component Value Date   MICROALBUR 15.8* 09/21/2014   LDLCALC 77 12/14/2014   CREATININE 0.76 06/20/2015   Other active problems discussed in review of systems     Medication List       This list is accurate as of: 07/05/15 11:18 AM.  Always use your most recent med list.  ACCU-CHEK AVIVA PLUS w/Device Kit  1 each by Does not apply route daily.     ACCU-CHEK SOFTCLIX LANCET DEV Kit  1 each by Does not apply route daily.     accu-chek softclix lancets  Use as instructed     aspirin 81 MG tablet  Take 81 mg by mouth daily.     B-D ULTRAFINE III SHORT PEN 31G X 8 MM Misc  Generic drug:  Insulin Pen Needle  Use 4 times daily as  directed     glucose blood test strip  Commonly known as:  ACCU-CHEK AVIVA PLUS  Use as instructed to check blood sugars 5 times per day dx code E11.65     HUMALOG KWIKPEN 100 UNIT/ML KiwkPen  Generic drug:  insulin lispro  Inject subcutaneously 8  units daily     Insulin Glargine 100 UNIT/ML Solostar Pen  Commonly known as:  LANTUS SOLOSTAR  Inject 40 Units into the skin daily at 10 pm.     lisinopril 5 MG tablet  Commonly known as:  PRINIVIL,ZESTRIL  Take 1 tablet by mouth  daily     metFORMIN 1000 MG tablet  Commonly known as:  GLUCOPHAGE  Take 1 tablet by mouth  twice a day with meals       niacin 500 MG tablet  Commonly known as:  SLO-NIACIN  Take 500 mg by mouth 2 (two) times daily.     VICTOZA 18 MG/3ML Sopn  Generic drug:  Liraglutide  Inject subcutaneously 1.20m daily        Allergies: No Known Allergies  Past Medical History  Diagnosis Date  . Diabetes mellitus ([place] 2,000  . HTN (hypertension)   . Hyperlipidemia   . Renal cancer ([place]     1992, has 1 kidney ; does not see urology any more  . Erectile dysfunction   . Personal history of colonic adenomas 07/18/2009    Past Surgical History  Procedure Laterality Date  . Nephrectomy  1992    Family History  Problem Relation Age of Onset  . Diabetes Father   . Hypertension Father   . CAD Father     F (MI age 68, Brother in [place] [address] . Colon cancer Neg Hx   . Prostate cancer Brother     dx age 68 . Stroke Neg Hx     Social History:  reports that he has quit smoking. He has quit using smokeless tobacco. He reports that he drinks alcohol. He reports that he does not use illicit drugs.  Review of Systems -     HYPERLIPIDEMIA:   He is now only on niacin  with adequate control of LDL  HDL has been in the 20s previously along with small LDL particles  Particle number was below 1000 in 5/15  He is tolerating OTC Slo-Niacin  500 mg twice a day    Lab Results  Component Value Date   CHOL 129 12/14/2014   HDL 26.80* 12/14/2014   LDLCALC 77 12/14/2014   LDLDIRECT 45.7 05/31/2009   TRIG 125.0 12/14/2014   CHOLHDL 5 12/14/2014    He has only mild hypertension  and is only on 5 mg lisinopril   He monitors BP at home, and blood pressure readings are normal usually No issues with renal dysfunction or potassium   Lab Results  Component Value Date   CREATININE 0.76 06/20/2015   BUN 15 06/20/2015   NA 138 06/20/2015   K 4.2 06/20/2015   CL  104 06/20/2015   CO2 25 06/20/2015    Eye exams: Annual 8/16  Diabetic foot exam was done in 06/2015   Examination:   BP 122/78 mmHg  Pulse 73   Temp(Src) 97.8 F (36.6 C)  Resp 14  Ht 5' 10"  (1.778 m)  Wt 178 lb 9.6 oz (81.012 kg)  BMI 25.63 kg/m2  SpO2 98%  Body mass index is 25.63 kg/(m^2).   No ankle edema  Diabetic Foot Exam - Simple   Simple Foot Form  Diabetic Foot exam was performed with the following findings:  Yes 07/05/2015 10:47 AM  Visual Inspection  No deformities, no ulcerations, no other skin breakdown bilaterally:  Yes  Sensation Testing  Intact to touch and monofilament testing bilaterally:  Yes  Pulse Check  Posterior Tibialis and Dorsalis pulse intact bilaterally:  Yes  Comments       Assesment/Plan:   Diabetes type 2, nonobese: See history of present illness for detailed discussion of his current blood sugar patterns, problems identified and current management His blood sugars are generally getting higher with A1c increasing from his baseline of 7% up to 7.6 However has had difficulty  getting postprandial readings under control partly because of not taking Victoza Also inconsistent diet is responsible for some of his postprandial hyperglycemia He forgets to take Humalog consistently especially at lunch and also does not understand the need to take it based on meal size and then pre-meal blood sugar He is also generally concerned about the cost of insulin  Discussed with him that to improve his control and better compliance he is a good candidate for trial of the V-go pump Discussed in detail how this works and how it would be utilized as well as benefits He agrees to do this and will be instructed by nurse educator today to start on his own tomorrow with the 30 units basal Boluses will be 4 units for small meals and 6-8 for larger meals for now Follow-up in 1 week  Most likely he will need less insulin overall with the pump Since he likes to get the benefit of the Victoza on portion control and was prandial control he will start his back again Also encouraged him to start more  exercise  Counseling time on subjects discussed above is over 50% of today's 25 minute visit  Patient Instructions  Click 2 times for small meals and 3-4x for larger meals on V-go  Exercise  Start Victoza at 0.6 for 3 days then 1.2     Ashlon Lottman 07/05/2015, 11:18 AM

## 2015-07-05 NOTE — Patient Instructions (Signed)
Fill and attach one V-go every morning. Stop all lantus insulin Test blood sugars before all meals and at bedtime. Take 2-3 button presses before all meals. Call 800 help line if questions about the V-go

## 2015-07-11 ENCOUNTER — Ambulatory Visit (INDEPENDENT_AMBULATORY_CARE_PROVIDER_SITE_OTHER): Payer: PPO | Admitting: Endocrinology

## 2015-07-11 ENCOUNTER — Encounter: Payer: Self-pay | Admitting: Endocrinology

## 2015-07-11 ENCOUNTER — Other Ambulatory Visit: Payer: Self-pay | Admitting: *Deleted

## 2015-07-11 VITALS — BP 132/84 | HR 73 | Temp 98.0°F | Resp 14 | Ht 70.0 in | Wt 181.8 lb

## 2015-07-11 DIAGNOSIS — E1165 Type 2 diabetes mellitus with hyperglycemia: Secondary | ICD-10-CM

## 2015-07-11 DIAGNOSIS — Z794 Long term (current) use of insulin: Secondary | ICD-10-CM

## 2015-07-11 MED ORDER — INSULIN ASPART 100 UNIT/ML ~~LOC~~ SOLN: 68.0000 [IU] | Freq: Once | SUBCUTANEOUS | Status: DC

## 2015-07-11 MED ORDER — V-GO 30 KIT: PACK | Status: DC

## 2015-07-11 NOTE — Progress Notes (Signed)
Patient ID: Manuel Petty, male   DOB: [birth year], 70 y.o.   MRN: [id number]   Reason for Appointment: follow-up V-go pump start  History of Present Illness   Diagnosis: Type 2 DIABETES MELITUS, date of diagnosis: 2007   Previous history: He had been on large doses of Lantus before he was given Victoza in addition and this improved his blood sugar control and reduced his insulin requirement However he subsequently started requiring mealtime coverage with large meals also  RECENT history:  Insulin regimen:   V-go to my 30 units basal, mealtime boluses generally 4-6 units  Lantus 28 units in the morning and 12 at bedtime    Humalog 0-4 acb, 0-8 acl, 8-10 units acs 2-3  His A1c was relatively higher at 7.6% and he is being given a trial of the V-go pump to improve his control as well as compliance with his mealtime boluses He was also told to start Victoza again which he had not taken  He has so far had fairly good control in the pump and is liking the convenience 4 bolusing  Current blood sugar patterns and problems identified:  Fasting readings are excellent with the recent range of 98-121  His high readings have been mostly late morning when he delayed changing his pump and once after lunch with inadequate bolus  Blood sugars after evening meal are fairly good, recent range 106-187.  He has taken 8 units bolus only for higher fat meals like lasagna last night  He is now bolusing for breakfast also which he did not use to do with the Humalog pen  Only hypoglycemia was related to his delaying his lunch and not having protein with his breakfast that morning  Oral hypoglycemic drugs: Metformin 1 g twice a day        Side effects from medications: None  Proper timing of medications in relation to meals: Yes.          Monitors blood glucose:  3-4 times a day.    Glucometer: Accucheck          Blood Glucose readings:  as above, recent range 59-257          Meals:2- 3 meals per day. Lunch 2-3 pm; dinner 6-7 pm.  For breakfast he will either eat oatmeal or eggs         Exercise: He says he is walking  about 3000 steps a day.   Dietician visit: Most recent: 01/4495           Complications: are: None     Wt Readings from Last 3 Encounters:  07/11/15 181 lb 12.8 oz (82.464 kg)  07/05/15 178 lb 9.6 oz (81.012 kg)  03/28/15 177 lb 9.6 oz (80.559 kg)      Lab Results  Component Value Date   HGBA1C 7.6* 06/20/2015   HGBA1C 7.3* 03/17/2015   HGBA1C 7.0* 12/14/2014   Lab Results  Component Value Date   MICROALBUR 15.8* 09/21/2014   LDLCALC 77 12/14/2014   CREATININE 0.76 06/20/2015   Other active problems discussed in review of systems     Medication List       This list is accurate as of: 07/11/15 11:06 AM.  Always use your most recent med list.               ACCU-CHEK AVIVA PLUS w/Device Kit  1 each by Does not apply route daily.     ACCU-CHEK SOFTCLIX LANCET DEV Kit  1  each by Does not apply route daily.     accu-chek softclix lancets  Use as instructed     aspirin 81 MG tablet  Take 81 mg by mouth daily.     B-D ULTRAFINE III SHORT PEN 31G X 8 MM Misc  Generic drug:  Insulin Pen Needle  Use 4 times daily as  directed     glucose blood test strip  Commonly known as:  ACCU-CHEK AVIVA PLUS  Use as instructed to check blood sugars 5 times per day dx code E11.65     insulin aspart 100 UNIT/ML injection  Commonly known as:  novoLOG  Inject 68 Units into the skin once.     Liraglutide 18 MG/3ML Sopn  Commonly known as:  VICTOZA  Inject subcutaneously 1.60m daily     lisinopril 5 MG tablet  Commonly known as:  PRINIVIL,ZESTRIL  Take 1 tablet by mouth  daily     metFORMIN 1000 MG tablet  Commonly known as:  GLUCOPHAGE  Take 1 tablet by mouth  twice a day with meals     niacin 500 MG tablet  Commonly known as:  SLO-NIACIN  Take 500 mg by mouth 2 (two) times daily.     V-GO 30 Kit  Use one per day         Allergies: No Known Allergies  Past Medical History  Diagnosis Date  . Diabetes [place] ([facility] 2,000  . HTN (hypertension)   . Hyperlipidemia   . Renal cancer ([facility]     1992, has 1 kidney ; does not see urology any more  . Erectile dysfunction   . Personal history of colonic adenomas 07/18/2009    Past Surgical History  Procedure Laterality Date  . Nephrectomy  1992    Family History  Problem Relation Age of Onset  . Diabetes Father   . Hypertension Father   . CAD Father     F (MI age 68, Brother in [place] 656 . Colon cancer Neg Hx   . Prostate cancer Brother     dx age 68 . Stroke Neg Hx     Social History:  reports that he has quit smoking. He has quit using smokeless tobacco. He reports that he drinks alcohol. He reports that he does not use illicit drugs.  Review of Systems -     HYPERLIPIDEMIA:   The following is a copy of his previous note:  He is now only on niacin  with adequate control of LDL  HDL has been in the 20s previously along with small LDL particles  Particle number was below 1000 in 5/15  He is tolerating OTC Slo-Niacin  500 mg twice a day    Lab Results  Component Value Date   CHOL 129 12/14/2014   HDL 26.80* 12/14/2014   LDLCALC 77 12/14/2014   LDLDIRECT 45.7 05/31/2009   TRIG 125.0 12/14/2014   CHOLHDL 5 12/14/2014    He has only mild hypertension  and is only on 5 mg lisinopril   He monitors BP at home, and blood pressure readings are normal usually No issues with renal dysfunction or potassium   Lab Results  Component Value Date   CREATININE 0.76 06/20/2015   BUN 15 06/20/2015   NA 138 06/20/2015   K 4.2 06/20/2015   CL 104 06/20/2015   CO2 25 06/20/2015    Eye exams: Annual 8/16  Diabetic foot exam was done in 06/2015   Examination:   BP 132/84 mmHg  Pulse 73  Temp(Src) 98 F (36.7 C)  Resp 14  Ht 5' 10"  (1.778 m)  Wt 181 lb 12.8 oz (82.464 kg)  BMI 26.09 kg/m2  SpO2 97%  Body mass index is 26.09  kg/(m^2).   No ankle edema  Diabetic Foot Exam - Simple   No data filed       Assesment/Plan:   Diabetes type 2, nonobese: See history of present illness for detailed discussion of his current blood sugar patterns, problems identified   With the V-go pump he is getting fairly good control He is learning how to use this especially with timing of changing the pumps as well as adjusting boluses for various kinds of meals So far he is better compliant with mealtime boluses but this and likes the features Is using less insulin and also, before was on 40 unit basal and not consistently doing mealtime boluses with Humalog He is a little concerned about the cost of the pump but would like to continue this now  He will continue his management unchanged along with Victoza Follow-up in 3 months with A1c  There are no Patient Instructions on file for this visit.   Sergi Gellner 07/11/2015, 11:06 AM

## 2015-08-16 ENCOUNTER — Other Ambulatory Visit: Payer: Self-pay

## 2015-08-17 ENCOUNTER — Ambulatory Visit (INDEPENDENT_AMBULATORY_CARE_PROVIDER_SITE_OTHER): Payer: PPO | Admitting: Internal Medicine

## 2015-08-17 ENCOUNTER — Encounter: Payer: Self-pay | Admitting: Internal Medicine

## 2015-08-17 VITALS — BP 132/78 | HR 76 | Temp 97.7°F | Ht 70.0 in | Wt 183.4 lb

## 2015-08-17 DIAGNOSIS — M19041 Primary osteoarthritis, right hand: Secondary | ICD-10-CM | POA: Diagnosis not present

## 2015-08-17 DIAGNOSIS — M779 Enthesopathy, unspecified: Secondary | ICD-10-CM | POA: Diagnosis not present

## 2015-08-17 DIAGNOSIS — M778 Other enthesopathies, not elsewhere classified: Secondary | ICD-10-CM

## 2015-08-17 DIAGNOSIS — L821 Other seborrheic keratosis: Secondary | ICD-10-CM | POA: Diagnosis not present

## 2015-08-17 DIAGNOSIS — M1990 Unspecified osteoarthritis, unspecified site: Secondary | ICD-10-CM | POA: Insufficient documentation

## 2015-08-17 DIAGNOSIS — M159 Polyosteoarthritis, unspecified: Secondary | ICD-10-CM

## 2015-08-17 DIAGNOSIS — Z09 Encounter for follow-up examination after completed treatment for conditions other than malignant neoplasm: Secondary | ICD-10-CM | POA: Insufficient documentation

## 2015-08-17 DIAGNOSIS — M150 Primary generalized (osteo)arthritis: Secondary | ICD-10-CM

## 2015-08-17 NOTE — Assessment & Plan Note (Signed)
DJD hand, tendinitis  w/ reported trigger finger: Avoid nsaids, steroids d/t single kidney and DM Recommend ice, Tylenol. Will also agreed to refer to sports medicine for possible local injection. Skin lesion: Appears classic for SK, discussed dermatology referral versus observation, patient agreed observation, will call if something changes particularly if the lesion gets darker or bleeding. RTC for a physical 10-2015

## 2015-08-17 NOTE — Progress Notes (Signed)
Pre visit review using our clinic review tool, if applicable. No additional management support is needed unless otherwise documented below in the visit note. 

## 2015-08-17 NOTE — Patient Instructions (Addendum)
GO TO THE FRONT DESK   Schedule your next appointment for a  Physical   When?   By [id number] Fasting?  Yes  ICE Tylenol     Seborrheic Keratosis Seborrheic keratosis is a common, noncancerous (benign) skin growth. This condition causes waxy, rough, tan, brown, or black spots to appear on the skin. These skin growths can be flat or raised. CAUSES The cause of this condition is not known. RISK FACTORS This condition is more likely to develop in:  People who have a family history of seborrheic keratosis.  People who are 56 or older.  People who are pregnant.  People who have had estrogen replacement therapy. SYMPTOMS This condition often occurs on the face, chest, shoulders, back, or other areas. These growths:  Are usually painless, but may become irritated and itchy.  Can be yellow, brown, black, or other colors.  Are slightly raised or have a flat surface.  Are sometimes rough or wart-like in texture.  Are often waxy on the surface.  Are round or oval-shaped.  Sometimes look like they are "stuck on."  Often occur in groups, but may occur as a single growth. DIAGNOSIS This condition is diagnosed with a medical history and physical exam. A sample of the growth may be tested (skin biopsy). You may need to see a skin specialist (dermatologist). TREATMENT Treatment is not usually needed for this condition, unless the growths are irritated or are often bleeding. You may also choose to have the growths removed if you do not like their appearance. Most commonly, these growths are treated with a procedure in which liquid nitrogen is applied to "freeze" off the growth (cryosurgery). They may also be burned off with electricity or cut off. HOME CARE INSTRUCTIONS  Watch your growth for any changes.  Keep all follow-up visits as told by your health care provider. This is important.  Do not scratch or pick at the growth or growths. This can cause them to become irritated or  infected. SEEK MEDICAL CARE IF:  You suddenly have many new growths.  Your growth bleeds, itches, or hurts.  Your growth suddenly becomes larger or changes color.   This information is not intended to replace advice given to you by your health care provider. Make sure you discuss any questions you have with your health care provider.   Document Released: 06/02/2010 Document Revised: 01/19/2015 Document Reviewed: 09/15/2014 Elsevier Interactive Patient Education [employer].

## 2015-08-17 NOTE — Progress Notes (Signed)
Subjective:    Patient ID: Manuel Petty, male    DOB: [birth year], 70 y.o.   MRN: [id number]  DOS:  08/17/2015 Type of visit - description : Acute visit Interval history: Right hand: Pain at the second knuckle and trigger finger phenomenon and the second finger for a while. 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 No injury Also has a skin lesion on the right temple, somewhat concerned about it. Has increased in size gradually over the last year or 2 but no bleeding or irritation.   Review of Systems   Past Medical History  Diagnosis Date  . Diabetes mellitus ([place]) 2,000  . HTN (hypertension)   . Hyperlipidemia   . Renal cancer ([place])     1992, has 1 kidney ; does not see urology any more  . Erectile dysfunction   . Personal history of colonic adenomas 07/18/2009    Past Surgical History  Procedure Laterality Date  . Nephrectomy  1992    Social History   Social History  . Marital Status: Married    Spouse Name: N/A  . Number of Children: 1  . Years of Education: N/A   Occupational History  . retired     Social History Main Topics  . Smoking status: Former Research scientist (life sciences)  . Smokeless tobacco: Former Systems developer     Comment: quit in 1992, 2 ppd  . Alcohol Use: Yes     Comment: beer sometimes  . Drug Use: No     Comment: used to use marihuana    . Sexual Activity: Not on file   Other Topics Concern  . Not on file   Social History Narrative   Lives w/ wife        Medication List       This list is accurate as of: 08/17/15 12:00 PM.  Always use your most recent med list.               ACCU-CHEK AVIVA PLUS w/Device Kit  1 each by Does not apply route daily.     accu-chek softclix lancets  Use as instructed     aspirin 81 MG tablet  Take 81 mg by mouth daily.     B-D ULTRAFINE III SHORT PEN 31G X 8 MM Misc  Generic drug:  Insulin Pen Needle  Use 4 times daily as  directed     glucose blood test strip  Commonly known as:  ACCU-CHEK AVIVA PLUS  Use as  instructed to check blood sugars 5 times per day dx code E11.65     insulin aspart 100 UNIT/ML injection  Commonly known as:  novoLOG  Inject 68 Units into the skin once.     Liraglutide 18 MG/3ML Sopn  Commonly known as:  VICTOZA  Inject subcutaneously 1.41m daily     lisinopril 5 MG tablet  Commonly known as:  PRINIVIL,ZESTRIL  Take 1 tablet by mouth  daily     metFORMIN 1000 MG tablet  Commonly known as:  GLUCOPHAGE  Take 1 tablet by mouth  twice a day with meals     niacin 500 MG tablet  Commonly known as:  SLO-NIACIN  Take 500 mg by mouth 2 (two) times daily.     V-GO 30 Kit  Use one per day           Objective:   Physical Exam  HENT:  Head:     BP 132/78 mmHg  Pulse 76  Temp(Src) 97.7  F (36.5 C) (Oral)  Ht 5' 10"  (1.778 m)  Wt 183 lb 6 oz (83.178 kg)  BMI 26.31 kg/m2  SpO2 98% General:   Well developed, well nourished . NAD.  HEENT:  Normocephalic . Face symmetric, atraumatic  MSK: Second knuckle right: Deformities consistent with DJD without synovitis. Palpation of the right hand second tendon slightly swollen. Range of motion of the second finger decreased. Skin: Not pale. Not jaundice Neurologic:  alert & oriented X3.  Speech normal, gait appropriate for age and unassisted Psych--  Cognition and judgment appear intact.  Cooperative with normal attention span and concentration.  Behavior appropriate. No anxious or depressed appearing.      Assessment & Plan:   Assessment DM--dr Dwyane Dee HTN Hyperlipidemia. E.D.. Renal cancer, 1992, nephrectomy x1,, released from urology  PLAN: DJD hand, tendinitis  w/ reported trigger finger: Avoid nsaids, steroids d/t single kidney and DM Recommend ice, Tylenol. Will also agreed to refer to sports medicine for possible local injection. Skin lesion: Appears classic for SK, discussed dermatology referral versus observation, patient agreed observation, will call if something changes particularly if the  lesion gets darker or bleeding. RTC for a physical 10-2015

## 2015-08-18 ENCOUNTER — Ambulatory Visit (INDEPENDENT_AMBULATORY_CARE_PROVIDER_SITE_OTHER): Payer: PPO | Admitting: Family Medicine

## 2015-08-18 ENCOUNTER — Encounter: Payer: Self-pay | Admitting: Family Medicine

## 2015-08-18 VITALS — BP 148/84 | HR 72 | Ht 70.0 in | Wt 180.0 lb

## 2015-08-18 DIAGNOSIS — M6530 Trigger finger, unspecified finger: Secondary | ICD-10-CM

## 2015-08-18 NOTE — Patient Instructions (Signed)
You have a trigger finger of your right index finger. Splinting for 6-10 weeks or a cortisone injection are both very effective. Get an aluminum padded splint - make sure the joints are in a little bit of flexion - or get the frog finger splint I showed you. Wear these as often as possible. Call me if you want to go ahead with an injection or one of our splints.  You have arthritis of your fingers. These are the classes of medicine you can take for this: Tylenol 500mg  1-2 tabs three times a day for pain. Glucosamine sulfate 750mg  twice a day is a supplement that may help. Capsaicin, aspercreme, or biofreeze topically up to four times a day may also help with pain. It's important that you continue to stay active. Heat 15 minutes at a time 3-4 times a day as needed to help with pain.

## 2015-08-19 DIAGNOSIS — M6530 Trigger finger, unspecified finger: Secondary | ICD-10-CM | POA: Insufficient documentation

## 2015-08-19 NOTE — Progress Notes (Signed)
PCP and consultation requested by: Kathlene November, MD  Subjective:   HPI: Patient is a 70 y.o. male here for right hand pain.  Patient reports he's had 6 months of pain in right index finger. Associated catching, locking. Swelling and 3/10 level pain, dull at base of index finger. No known injury or trauma. Worse after prolonged immobility. No skin changes, numbness.  Past Medical History  Diagnosis Date  . Diabetes mellitus ([place]) 2,000  . HTN (hypertension)   . Hyperlipidemia   . Renal cancer ([place])     1992, has 1 kidney ; does not see urology any more  . Erectile dysfunction   . Personal history of colonic adenomas 07/18/2009    Current Outpatient Prescriptions on File Prior to Visit  Medication Sig Dispense Refill  . aspirin 81 MG tablet Take 81 mg by mouth daily.    . B-D ULTRAFINE III SHORT PEN 31G X 8 MM MISC Use 4 times daily as  directed (Patient not taking: Reported on 08/17/2015) 360 each 1  . Blood Glucose Monitoring Suppl (ACCU-CHEK AVIVA PLUS) W/DEVICE KIT 1 each by Does not apply route daily. (Patient not taking: Reported on 08/17/2015) 1 kit 0  . glucose blood (ACCU-CHEK AVIVA PLUS) test strip Use as instructed to check blood sugars 5 times per day dx code E11.65 (Patient not taking: Reported on 08/17/2015) 450 each 3  . insulin aspart (NOVOLOG) 100 UNIT/ML injection Inject 68 Units into the skin once. 60 mL 1  . Insulin Disposable Pump (V-GO 30) KIT Use one per day 1 kit 3  . Lancet Devices (ACCU-CHEK SOFTCLIX) lancets Use as instructed (Patient not taking: Reported on 08/17/2015) 100 each 1  . Liraglutide (VICTOZA) 18 MG/3ML SOPN Inject subcutaneously 1.81m daily 27 mL 1  . lisinopril (PRINIVIL,ZESTRIL) 5 MG tablet Take 1 tablet by mouth  daily 90 tablet 1  . metFORMIN (GLUCOPHAGE) 1000 MG tablet Take 1 tablet by mouth  twice a day with meals 180 tablet 1  . niacin (SLO-NIACIN) 500 MG tablet Take 500 mg by mouth 2 (two) times daily.     No current facility-administered  medications on file prior to visit.    Past Surgical History  Procedure Laterality Date  . Nephrectomy  1992    No Known Allergies  Social History   Social History  . Marital Status: Married    Spouse Name: N/A  . Number of Children: 1  . Years of Education: N/A   Occupational History  . retired     Social History Main Topics  . Smoking status: Former SResearch scientist (life sciences) . Smokeless tobacco: Former [employer] developer    Comment: quit in 1992, 2 ppd  . Alcohol Use: 0.0 oz/week    0 Standard drinks or equivalent per week     Comment: beer sometimes  . Drug Use: No     Comment: used to use marihuana    . Sexual Activity: Not on file   Other Topics Concern  . Not on file   Social History Narrative   Lives w/ wife    Family History  Problem Relation Age of Onset  . Diabetes Father   . Hypertension Father   . CAD Father     F (MI age 68, Brother in [place] [address] . Colon cancer Neg Hx   . Prostate cancer Brother     dx age 68 . Stroke Neg Hx     BP 148/84 mmHg  Pulse 72  Ht 5' 10" (  1.778 m)  Wt 180 lb (81.647 kg)  BMI 25.83 kg/m2  Review of Systems: See HPI above.    Objective:  Physical Exam:  Gen: NAD, comfortable in exam room  Right hand: Arthritic changes of PIP, DIP joints.  No gross deformity, swelling, bruising otherwise. TTP A1 pulley of 2nd digit with mild tenderness.  No other tenderness. FROM all digits. Strength 5/5 as well. NVI distally.    Assessment & Plan:  1. Right 2nd digit trigger finger - discussed options - he would like to try splinting for 6-10 weeks.  Shown how to do this, wear as often as possible.  He would like to buy splint OTC so shown which one to buy.  Consider injection if not improving over next 6-10 weeks.  NSAIDs if needed, heat.

## 2015-08-19 NOTE — Assessment & Plan Note (Signed)
Right 2nd digit trigger finger - discussed options - he would like to try splinting for 6-10 weeks.  Shown how to do this, wear as often as possible.  He would like to buy splint OTC so shown which one to buy.  Consider injection if not improving over next 6-10 weeks.  NSAIDs if needed, heat.

## 2015-08-25 ENCOUNTER — Other Ambulatory Visit: Payer: Self-pay | Admitting: *Deleted

## 2015-08-25 MED ORDER — LISINOPRIL 5 MG PO TABS: ORAL_TABLET | ORAL | Status: DC

## 2015-09-21 ENCOUNTER — Other Ambulatory Visit: Payer: Self-pay | Admitting: *Deleted

## 2015-09-21 MED ORDER — METFORMIN HCL 1000 MG PO TABS: ORAL_TABLET | ORAL | Status: DC

## 2015-09-28 ENCOUNTER — Other Ambulatory Visit: Payer: Self-pay | Admitting: *Deleted

## 2015-09-28 ENCOUNTER — Telehealth: Payer: Self-pay | Admitting: Endocrinology

## 2015-09-28 MED ORDER — INSULIN LISPRO 100 UNIT/ML ~~LOC~~ SOLN: SUBCUTANEOUS | Status: DC

## 2015-09-28 NOTE — Telephone Encounter (Signed)
Patient said the price on his novolog was going up very high, he asked if Humalog could be sent to see if that's cheaper, rx sent.

## 2015-09-28 NOTE — Telephone Encounter (Signed)
Patient ask you to give him a call °

## 2015-10-11 ENCOUNTER — Other Ambulatory Visit (INDEPENDENT_AMBULATORY_CARE_PROVIDER_SITE_OTHER): Payer: PPO

## 2015-10-11 DIAGNOSIS — E1165 Type 2 diabetes mellitus with hyperglycemia: Secondary | ICD-10-CM | POA: Diagnosis not present

## 2015-10-11 DIAGNOSIS — Z794 Long term (current) use of insulin: Secondary | ICD-10-CM | POA: Diagnosis not present

## 2015-10-11 LAB — BASIC METABOLIC PANEL
BUN: 13 mg/dL (ref 6–23)
CALCIUM: 8.8 mg/dL (ref 8.4–10.5)
CHLORIDE: 109 meq/L (ref 96–112)
CO2: 27 meq/L (ref 19–32)
CREATININE: 0.77 mg/dL (ref 0.40–1.50)
GFR: 106.91 mL/min (ref >60.00)
Glucose, Bld: 93 mg/dL (ref 70–99)
Potassium: 4.1 mEq/L (ref 3.5–5.1)
SODIUM: 140 meq/L (ref 135–145)

## 2015-10-11 LAB — LIPID PANEL
CHOL/HDL RATIO: 5
Cholesterol: 120 mg/dL (ref 0–200)
HDL: 24.4 mg/dL — AB (ref >39.00)
LDL Cholesterol: 65 mg/dL (ref 0–99)
NONHDL: 95.14
Triglycerides: 151 mg/dL — ABNORMAL HIGH (ref 0.0–149.0)
VLDL: 30.2 mg/dL (ref 0.0–40.0)

## 2015-10-11 LAB — MICROALBUMIN / CREATININE URINE RATIO
CREATININE, U: 113.7 mg/dL
MICROALB/CREAT RATIO: 7.2 mg/g (ref 0.0–30.0)
Microalb, Ur: 8.2 mg/dL — ABNORMAL HIGH (ref 0.0–1.9)

## 2015-10-11 LAB — HEMOGLOBIN A1C: HEMOGLOBIN A1C: 6.8 % — AB (ref 4.6–6.5)

## 2015-10-18 ENCOUNTER — Ambulatory Visit (INDEPENDENT_AMBULATORY_CARE_PROVIDER_SITE_OTHER): Payer: PPO | Admitting: Endocrinology

## 2015-10-18 ENCOUNTER — Encounter: Payer: Self-pay | Admitting: Endocrinology

## 2015-10-18 VITALS — BP 166/80 | HR 74 | Temp 98.1°F | Resp 14 | Ht 70.0 in | Wt 184.2 lb

## 2015-10-18 DIAGNOSIS — E1165 Type 2 diabetes mellitus with hyperglycemia: Secondary | ICD-10-CM | POA: Diagnosis not present

## 2015-10-18 DIAGNOSIS — Z794 Long term (current) use of insulin: Secondary | ICD-10-CM | POA: Diagnosis not present

## 2015-10-18 DIAGNOSIS — E782 Mixed hyperlipidemia: Secondary | ICD-10-CM

## 2015-10-18 NOTE — Patient Instructions (Signed)
Next insulin Relion Novolin R  Bedtime snack with protein  Walk daily

## 2015-10-18 NOTE — Progress Notes (Signed)
Patient ID: Manuel Petty, male   DOB: 1945-09-29, 70 y.o.   MRN: [id number]   Reason for Appointment: follow-up diabetes  History of Present Illness   Diagnosis: Type 2 DIABETES MELITUS, date of diagnosis: 2007   Previous history: He had been on large doses of Lantus before he was given Victoza in addition and this improved his blood sugar control and reduced his insulin requirement However he subsequently started requiring mealtime coverage with large meals also  RECENT history:  Insulin regimen:   V-go 30 units basal, mealtime boluses generally 4-6 units  His A1c was relatively higher at 7.6% when he was switched to the V-go pump and his A1c is now better at 6.8  He has so far had fairly good control in the pump and is liking the convenience 4 bolusing  Current blood sugar patterns and problems identified:  Fasting readings are excellent with average about 130 with some fluctuation  He has some fluctuation of his blood sugars throughout the day based on his diet  Occasionally will forget his bolus at suppertime including last night and glucose may be higher  He has only occasional hypoglycemia possibly when he is not getting enough protein at breakfast He does like the V-go pump and wants to continue despite the high cost out of pocket in the doughnut hole He has gained a little weight and has not been motivated to exercise  Non-insulin hypoglycemic drugs: Metformin 1 g twice a day, Victoza 1.8 mg daily        Side effects from medications: None  Proper timing of medications in relation to meals: Yes.          Monitors blood glucose:  3-4 times a day.    Glucometer: Accucheck          Blood Glucose readings:    Mean values apply above for all meters except median for One Touch  PRE-MEAL Fasting 1-3 PM  Dinner Bedtime Overall  Glucose range: 79-199  74-206  108-207  90-221    Mean/median: 131  151  144  141  137    POST-MEAL 8-10 AM  PC Lunch PC  Dinner  Glucose range: 59-198     Mean/median: 121             Meals:2- 3 meals per day. Lunch 2-3 pm; dinner 6-7 pm.  For breakfast he will either eat oatmeal or eggs, Bedtime snack, sometimes has ice cream         Exercise: minimal  Dietician visit: Most recent: 12/1155           Complications: are: None     Wt Readings from Last 3 Encounters:  10/18/15 184 lb 3.2 oz (83.553 kg)  08/18/15 180 lb (81.647 kg)  08/17/15 183 lb 6 oz (83.178 kg)      Lab Results  Component Value Date   HGBA1C 6.8* 10/11/2015   HGBA1C 7.6* 06/20/2015   HGBA1C 7.3* 03/17/2015   Lab Results  Component Value Date   MICROALBUR 8.2* 10/11/2015   LDLCALC 65 10/11/2015   CREATININE 0.77 10/11/2015   Other active problems discussed in review of systems     Medication List       This list is accurate as of: 10/18/15  9:51 AM.  Always use your most recent med list.               ACCU-CHEK AVIVA PLUS w/Device Kit  1 each by Does not apply route  daily.     accu-chek softclix lancets  Use as instructed     aspirin 81 MG tablet  Take 81 mg by mouth daily.     B-D ULTRAFINE III SHORT PEN 31G X 8 MM Misc  Generic drug:  Insulin Pen Needle  Use 4 times daily as  directed     glucose blood test strip  Commonly known as:  ACCU-CHEK AVIVA PLUS  Use as instructed to check blood sugars 5 times per day dx code E11.65     insulin aspart 100 UNIT/ML injection  Commonly known as:  novoLOG  Inject 68 Units into the skin once.     Liraglutide 18 MG/3ML Sopn  Commonly known as:  VICTOZA  Inject subcutaneously 1.60m daily     lisinopril 5 MG tablet  Commonly known as:  PRINIVIL,ZESTRIL  Take 1 tablet by mouth  daily     metFORMIN 1000 MG tablet  Commonly known as:  GLUCOPHAGE  Take 1 tablet by mouth  twice a day with meals     niacin 500 MG tablet  Commonly known as:  SLO-NIACIN  Take 500 mg by mouth 2 (two) times daily.     V-GO 30 Kit  Use one per day        Allergies: No Known  Allergies  Past Medical History  Diagnosis Date  . [place]  . HTN (hypertension)   . Hyperlipidemia   . [place] ([place]     1992, has 1 kidney ; does not see urology any more  . Erectile dysfunction   . Personal history of colonic adenomas 07/18/2009    Past Surgical History  Procedure Laterality Date  . Nephrectomy  1992    Family History  Problem Relation Age of Onset  . Diabetes Father   . Hypertension Father   . CAD Father     F (MI age 68, Brother in [place] 664 . Colon cancer Neg Hx   . Prostate cancer Brother     dx age 68 . Stroke Neg Hx     Social History:  reports that he has quit smoking. He has quit using smokeless tobacco. He reports that he drinks alcohol. He reports that he does not use illicit drugs.  Review of Systems -     HYPERLIPIDEMIA:   He is only on niacin  with adequate control of LDL and triglycerides  HDL has been in the 20s previously along with small LDL particles  Particle number was below 1000 in 06/2014 but still has increased small particles  He is Consistent with OTC Slo-Niacin  500 mg twice a day but his HDL is still low    Lab Results  Component Value Date   CHOL 120 10/11/2015   HDL 24.40* 10/11/2015   LDLCALC 65 10/11/2015   LDLDIRECT 45.7 05/31/2009   TRIG 151.0* 10/11/2015   CHOLHDL 5 10/11/2015    He has mild hypertension  and is only on 5 mg lisinopril   He monitors BP at home, and blood pressure readings are about 130 usually Blood pressure appears to be high today in the office, he thinks this is white coat syndrome   Lab Results  Component Value Date   CREATININE 0.77 10/11/2015   BUN 13 10/11/2015   NA 140 10/11/2015   K 4.1 10/11/2015   CL 109 10/11/2015   CO2 27 10/11/2015    Eye exams: Annual 8/16  Diabetic foot exam was done in 06/2015  Examination:   BP 166/80 mmHg  Pulse 74  Temp(Src) 98.1 F (36.7 C)  Resp 14  Ht _0  (1.778 m)  Wt 184 lb 3.2 oz (83.553 kg)  BMI  26.43 kg/m2  SpO2 97%  Body mass index is 26.43 kg/(m^2).    Repeat blood pressure was slightly better diastolic  Assesment/Plan:   Diabetes type 2, nonobese: See history of present illness for detailed discussion of his current blood sugar patterns, problems identified   With the V-go pump he is getting much better controlled and with multiple daily injections He does have sporadic high readings at various times based on his diet and occasionally with forgetting to bolus on his pump Since he is benefiting from this he would like to continue using the pump even though his insurance is not covering during donut hole. Currently using NovoLog insulin and discussed using [employer] brand for less expensive although will need to be dose 30 minutes before eating  Encouraged him to start walking for exercise for multiple benefits including keeping his weight down He can adjust his boluses further based on his meals and may need less at breakfast especially if not having as much carbohydrate Discussed low fat low glycemic index options for bedtime snack instead of ice cream  Follow-up in 4 months with A1c  HYPERTENSION: He needs to take his blood pressure monitor for comparison to his PCP   Patient Instructions  Next insulin Relion Novolin R  Bedtime snack with protein  Walk daily     Counseling time on subjects discussed above is over 50% of today's 25 minute visit   Eh Sauseda 10/18/2015, 9:51 AM

## 2015-11-21 ENCOUNTER — Telehealth: Payer: Self-pay | Admitting: Endocrinology

## 2015-11-21 MED ORDER — V-GO 30 KIT: PACK | Status: DC

## 2015-11-21 NOTE — Telephone Encounter (Signed)
Rx submitted per pt's request.  

## 2015-11-21 NOTE — Telephone Encounter (Signed)
pharmist sent over a request for a refill for the 30 kit. [id number] - [place], [address] AT [facility] OF [address] [phone] (Phone) [phone] (Fax)

## 2015-12-28 DIAGNOSIS — E1136 Type 2 diabetes mellitus with diabetic cataract: Secondary | ICD-10-CM | POA: Diagnosis not present

## 2015-12-28 DIAGNOSIS — Z7984 Long term (current) use of oral hypoglycemic drugs: Secondary | ICD-10-CM | POA: Diagnosis not present

## 2015-12-28 DIAGNOSIS — Z794 Long term (current) use of insulin: Secondary | ICD-10-CM | POA: Diagnosis not present

## 2015-12-28 DIAGNOSIS — E083293 Diabetes mellitus due to underlying condition with mild nonproliferative diabetic retinopathy without macular edema, bilateral: Secondary | ICD-10-CM | POA: Diagnosis not present

## 2015-12-28 LAB — HM DIABETES EYE EXAM

## 2016-01-02 ENCOUNTER — Telehealth: Payer: Self-pay | Admitting: Endocrinology

## 2016-01-02 ENCOUNTER — Encounter: Payer: Self-pay | Admitting: Internal Medicine

## 2016-01-02 MED ORDER — INSULIN REGULAR HUMAN 100 UNIT/ML IJ SOLN: 68.0000 [IU] | Freq: Every day | INTRAMUSCULAR | 1 refills | Status: DC

## 2016-01-02 MED ORDER — FREESTYLE LITE DEVI: 2 refills | Status: DC

## 2016-01-02 MED ORDER — GLUCOSE BLOOD VI STRP: ORAL_STRIP | 2 refills | Status: DC

## 2016-01-02 NOTE — Telephone Encounter (Addendum)
Rx submitted for Novolin R, freestyle lite meter and test strips per pt's request.

## 2016-01-02 NOTE — Telephone Encounter (Signed)
Please call pt about meds would not specify

## 2016-02-14 ENCOUNTER — Other Ambulatory Visit (INDEPENDENT_AMBULATORY_CARE_PROVIDER_SITE_OTHER): Payer: PPO

## 2016-02-14 DIAGNOSIS — E782 Mixed hyperlipidemia: Secondary | ICD-10-CM | POA: Diagnosis not present

## 2016-02-14 DIAGNOSIS — E1165 Type 2 diabetes mellitus with hyperglycemia: Secondary | ICD-10-CM

## 2016-02-14 DIAGNOSIS — Z794 Long term (current) use of insulin: Secondary | ICD-10-CM

## 2016-02-14 LAB — BASIC METABOLIC PANEL
BUN: 11 mg/dL (ref 6–23)
CHLORIDE: 108 meq/L (ref 96–112)
CO2: 27 meq/L (ref 19–32)
Calcium: 8.6 mg/dL (ref 8.4–10.5)
Creatinine, Ser: 0.78 mg/dL (ref 0.40–1.50)
GFR: 105.22 mL/min (ref >60.00)
Glucose, Bld: 92 mg/dL (ref 70–99)
POTASSIUM: 4 meq/L (ref 3.5–5.1)
Sodium: 141 mEq/L (ref 135–145)

## 2016-02-14 LAB — HEMOGLOBIN A1C: HEMOGLOBIN A1C: 6.4 % (ref 4.6–6.5)

## 2016-02-21 ENCOUNTER — Encounter: Payer: Self-pay | Admitting: Endocrinology

## 2016-02-21 ENCOUNTER — Ambulatory Visit (INDEPENDENT_AMBULATORY_CARE_PROVIDER_SITE_OTHER): Payer: PPO | Admitting: Endocrinology

## 2016-02-21 VITALS — BP 133/73 | HR 82 | Temp 98.1°F | Resp 14 | Ht 70.0 in | Wt 182.6 lb

## 2016-02-21 DIAGNOSIS — E1165 Type 2 diabetes mellitus with hyperglycemia: Secondary | ICD-10-CM

## 2016-02-21 DIAGNOSIS — Z23 Encounter for immunization: Secondary | ICD-10-CM | POA: Diagnosis not present

## 2016-02-21 DIAGNOSIS — Z794 Long term (current) use of insulin: Secondary | ICD-10-CM | POA: Diagnosis not present

## 2016-02-21 NOTE — Patient Instructions (Signed)
  Click one less time at dinner  Protein at [facility] daily  Back on Novolog in 1/18  Walk daily

## 2016-02-21 NOTE — Progress Notes (Signed)
Patient ID: Manuel Petty, male   DOB: 1946/04/08, 70 y.o.   MRN: [id number]   Reason for Appointment: follow-up diabetes  History of Present Illness   Diagnosis: Type 2 DIABETES MELITUS, date of diagnosis: 2007   Previous history: He had been on large doses of Lantus before he was given Victoza in addition and this improved his blood sugar control and reduced his insulin requirement However he subsequently started requiring mealtime coverage with large meals also  RECENT history:  Insulin regimen: [employer] brand Regular Insulin  V-go 30 units basal, mealtime boluses Bfst 2-3; 3-4 lunch, dinner 4-5 clicks  His K9T was relatively higher at 7.6% when he was switched to the V-go pump and his A1c is now better at 6.4, previously 6.8  He has so far had fairly good control in the pump and is liking the day-to-day management including bolusing  Current blood sugar patterns and problems identified:  Fasting readings are excellent with average about 125although not consistent  With regular insulin he is not always bolusing 20-30 minutes before  This may cause high readings after breakfast especially when eating cereal  Also has variable readings after lunch with this  Blood sugars maybe dropping low between 12-2 AM occasionally  He is still checking his blood sugars 4-5 times a day  Average blood sugar at any given time is generally 120-140 but overall higher at lunch although variable based on his morning breakfast  Despite reminders he does not exercise and has not lost weight  He still does not want to go to Victoza again because of the cost  HYPOGLYCEMIA: Lowest reading 49 at 1 AM and has only a single reading that is low before supper He does like the V-go pump and wants to continue despite the high cost out of pocket in the doughnut hole  Non-insulin hypoglycemic drugs: Metformin 1 g twice a day      Side effects from medications: None  Proper timing of  medications in relation to meals: Yes.          Monitors blood glucose:  3-4 times a day.    Glucometer: Accucheck          Blood Glucose readings:    Mean values apply above for all meters except median for One Touch  PRE-MEAL Fasting Lunch Dinner Bedtime Overall  Glucose range: 71-178   92-187   Mean/median: 123 168 140 131 131          Meals:2- 3 meals per day. Lunch 2-3 pm; dinner 6 pm.  For breakfast he will either eat oatmeal or eggs, Bedtime snack, sometimes has ice cream, cereal         Exercise: minimal  Dietician visit: Most recent: 06/6710           Complications: are: None     Wt Readings from Last 3 Encounters:  02/21/16 182 lb 9.6 oz (82.8 kg)  10/18/15 184 lb 3.2 oz (83.6 kg)  08/18/15 180 lb (81.6 kg)      Lab Results  Component Value Date   HGBA1C 6.4 02/14/2016   HGBA1C 6.8 (H) 10/11/2015   HGBA1C 7.6 (H) 06/20/2015   Lab Results  Component Value Date   MICROALBUR 8.2 (H) 10/11/2015   LDLCALC 65 10/11/2015   CREATININE 0.78 02/14/2016   Other active problems discussed in review of systems     Medication List       Accurate as of 02/21/16  9:48 PM. Always use  your most recent med list.          accu-chek softclix lancets Use as instructed   aspirin 81 MG tablet Take 81 mg by mouth daily.   B-D ULTRAFINE III SHORT PEN 31G X 8 MM Misc Generic drug:  Insulin Pen Needle Use 4 times daily as  directed   FREESTYLE LITE Devi Use to check blood sugar 5 times per day.   glucose blood test strip Commonly known as:  FREESTYLE LITE Use to check blood sugar 5 times per day   insulin regular 100 units/mL injection Commonly known as:  NOVOLIN R Inject 0.68 mLs (68 Units total) into the skin daily.   liraglutide 18 MG/3ML Sopn Commonly known as:  VICTOZA Inject subcutaneously 1.63m daily   lisinopril 5 MG tablet Commonly known as:  PRINIVIL,ZESTRIL Take 1 tablet by mouth  daily   metFORMIN 1000 MG tablet Commonly known as:   GLUCOPHAGE Take 1 tablet by mouth  twice a day with meals   niacin 500 MG tablet Commonly known as:  SLO-NIACIN Take 500 mg by mouth 2 (two) times daily.   V-GO 30 Kit Use one per day       Allergies: No Known Allergies  Past Medical History:  Diagnosis Date  . [place] 2,000  . Erectile dysfunction   . HTN (hypertension)   . Hyperlipidemia   . Personal history of colonic adenomas 07/18/2009  . [place]    [place], has 1 kidney ; does not see urology any more    Past Surgical History:  Procedure Laterality Date  . NEPHRECTOMY  1992    Family History  Problem Relation Age of Onset  . Diabetes Father   . Hypertension Father   . CAD Father     F (MI age 238, Brother in [place] 654 . Colon cancer Neg Hx   . Prostate cancer Brother     dx age 68 . Stroke Neg Hx     Social History:  reports that he has quit smoking. He has quit using smokeless tobacco. He reports that he drinks alcohol. He reports that he does not use drugs.  Review of Systems -     HYPERLIPIDEMIA:   He is only on niacin  with adequate control of LDL and triglycerides  HDL has been in the 20s previously along with small LDL particles  Particle number was below 1000 in 06/2014 but still has increased small particles  He is Consistent with OTC Slo-Niacin  500 mg twice a day but his HDL is still low , triglycerides borderline at 150   Lab Results  Component Value Date   CHOL 120 10/11/2015   HDL 24.40 (L) 10/11/2015   LDLCALC 65 10/11/2015   LDLDIRECT 45.7 05/31/2009   TRIG 151.0 (H) 10/11/2015   CHOLHDL 5 10/11/2015    He has mild hypertension  and is controlled on 5 mg lisinopril   He monitors BP at home, and blood pressure readings are usually normal    Lab Results  Component Value Date   CREATININE 0.78 02/14/2016   BUN 11 02/14/2016   NA 141 02/14/2016   K 4.0 02/14/2016   CL 108 02/14/2016   CO2 27 02/14/2016    Eye exams: Annual 8/17   Diabetic foot  exam was done in 06/2015   Examination:   BP 133/73   Pulse 82   Temp 98.1 F (36.7 C)   Resp 14   Ht 5'  10" (1.778 m)   Wt 182 lb 9.6 oz (82.8 kg)   SpO2 96%   BMI 26.20 kg/m   Body mass index is 26.2 kg/m.     Assesment/Plan:   Diabetes type 2, nonobese: See history of present illness for detailed discussion of his current blood sugar patterns, problems identified   With the V-go pump he is getting much better controlled and A1c is now 6.4 This is without excessive hypoglycemia although with using Regular Insulin he may tend to get some low sugars late at night or right before supper time With his 30unit basal his fasting readings are fairly good on the V-go pump  Encouraged him to start walking for exercise for multiple benefits including keeping his weight down Probably needs a little less bolus at suppertime since bedtime readings a low-normal and he has occasional late-night hypoglycemia also Needs to watch snacks and also get protein consistently at breakfast Discussed day-to-day management with the V-go pump, boluses and use of regular versus NovoLog insulin Recommended he go back to NovoLog in January  He can adjust his boluses further based on his meals and may need less at breakfast especially if not having as much carbohydrate Discussed low fat low glycemic index options for bedtime snack instead of ice cream  Follow-up in 3 months with A1c    Patient Instructions   Click one less time at dinner  Protein at Bfst daily  Back on Novolog in 1/18  Walk daily  Counseling time on subjects discussed above is over 50% of today's 25 minute visit   Hipolito Martinezlopez 02/21/2016, 9:48 PM

## 2016-02-27 ENCOUNTER — Other Ambulatory Visit: Payer: Self-pay | Admitting: *Deleted

## 2016-02-27 MED ORDER — LISINOPRIL 5 MG PO TABS: ORAL_TABLET | ORAL | 1 refills | Status: DC

## 2016-03-14 ENCOUNTER — Encounter: Payer: Self-pay | Admitting: Internal Medicine

## 2016-03-14 ENCOUNTER — Ambulatory Visit (INDEPENDENT_AMBULATORY_CARE_PROVIDER_SITE_OTHER): Payer: PPO | Admitting: Internal Medicine

## 2016-03-14 VITALS — BP 132/74 | HR 72 | Temp 98.1°F | Resp 14 | Ht 70.0 in | Wt 182.4 lb

## 2016-03-14 DIAGNOSIS — Z0000 Encounter for general adult medical examination without abnormal findings: Secondary | ICD-10-CM | POA: Diagnosis not present

## 2016-03-14 NOTE — Patient Instructions (Signed)
Get your blood work before you leave      Next visit in one year    Fall Prevention and Home Safety Falls cause injuries and can affect all age groups. It is possible to use preventive measures to significantly decrease the likelihood of falls. There are many simple measures which can make your home safer and prevent falls. OUTDOORS  Repair cracks and edges of walkways and driveways.  Remove high doorway thresholds.  Trim shrubbery on the main path into your home.  Have good outside lighting.  Clear walkways of tools, rocks, debris, and clutter.  Check that handrails are not broken and are securely fastened. Both sides of steps should have handrails.  Have leaves, snow, and ice cleared regularly.  Use sand or salt on walkways during winter months.  In the garage, clean up grease or oil spills. BATHROOM  Install night lights.  Install grab bars by the toilet and in the tub and shower.  Use non-skid mats or decals in the tub or shower.  Place a plastic non-slip stool in the shower to sit on, if needed.  Keep floors dry and clean up all water on the floor immediately.  Remove soap buildup in the tub or shower on a regular basis.  Secure bath mats with non-slip, double-sided rug tape.  Remove throw rugs and tripping hazards from the floors. BEDROOMS  Install night lights.  Make sure a bedside light is easy to reach.  Do not use oversized bedding.  Keep a telephone by your bedside.  Have a firm chair with side arms to use for getting dressed.  Remove throw rugs and tripping hazards from the floor. KITCHEN  Keep handles on pots and pans turned toward the center of the stove. Use back burners when possible.  Clean up spills quickly and allow time for drying.  Avoid walking on wet floors.  Avoid hot utensils and knives.  Position shelves so they are not too high or low.  Place commonly used objects within easy reach.  If necessary, use a sturdy step  stool with a grab bar when reaching.  Keep electrical cables out of the way.  Do not use floor polish or wax that makes floors slippery. If you must use wax, use non-skid floor wax.  Remove throw rugs and tripping hazards from the floor. STAIRWAYS  Never leave objects on stairs.  Place handrails on both sides of stairways and use them. Fix any loose handrails. Make sure handrails on both sides of the stairways are as long as the stairs.  Check carpeting to make sure it is firmly attached along stairs. Make repairs to worn or loose carpet promptly.  Avoid placing throw rugs at the top or bottom of stairways, or properly secure the rug with carpet tape to prevent slippage. Get rid of throw rugs, if possible.  Have an electrician put in a light switch at the top and bottom of the stairs. OTHER FALL PREVENTION TIPS  Wear low-heel or rubber-soled shoes that are supportive and fit well. Wear closed toe shoes.  When using a stepladder, make sure it is fully opened and both spreaders are firmly locked. Do not climb a closed stepladder.  Add color or contrast paint or tape to grab bars and handrails in your home. Place contrasting color strips on first and last steps.  Learn and use mobility aids as needed. Install an electrical emergency response system.  Turn on lights to avoid dark areas. Replace light bulbs that burn   out immediately. Get light switches that glow.  Arrange furniture to create clear pathways. Keep furniture in the same place.  Firmly attach carpet with non-skid or double-sided tape.  Eliminate uneven floor surfaces.  Select a carpet pattern that does not visually hide the edge of steps.  Be aware of all pets. OTHER HOME SAFETY TIPS  Set the water temperature for 120 F (48.8 C).  Keep emergency numbers on or near the telephone.  Keep smoke detectors on every level of the home and near sleeping areas. Document Released: 04/20/2002 Document Revised: 10/30/2011  Document Reviewed: 07/20/2011 ExitCare Patient Information 2015 [employer]. This information is not intended to replace advice given to you by your health care provider. Make sure you discuss any questions you have with your health care provider.   Preventive Care for Adults Ages 65 and over  Blood pressure check.** / Every 1 to 2 years.  Lipid and cholesterol check.**/ Every 5 years beginning at age 20.  Lung cancer screening. / Every year if you are aged 55-80 years and have a 30-pack-year history of smoking and currently smoke or have quit within the past 15 years. Yearly screening is stopped once you have quit smoking for at least 15 years or develop a health problem that would prevent you from having lung cancer treatment.  Fecal occult blood test (FOBT) of stool. / Every year beginning at age 50 and continuing until age 75. You may not have to do this test if you get a colonoscopy every 10 years.  Flexible sigmoidoscopy** or colonoscopy.** / Every 5 years for a flexible sigmoidoscopy or every 10 years for a colonoscopy beginning at age 50 and continuing until age 75.  Hepatitis C blood test.** / For all people born from 1945 through 1965 and any individual with known risks for hepatitis C.  Abdominal aortic aneurysm (AAA) screening.** / A one-time screening for ages 65 to 75 years who are current or former smokers.  Skin self-exam. / Monthly.  Influenza vaccine. / Every year.  Tetanus, diphtheria, and acellular pertussis (Tdap/Td) vaccine.** / 1 dose of Td every 10 years.  Varicella vaccine.** / Consult your health care provider.  Zoster vaccine.** / 1 dose for adults aged 60 years or older.  Pneumococcal 13-valent conjugate (PCV13) vaccine.** / Consult your health care provider.  Pneumococcal polysaccharide (PPSV23) vaccine.** / 1 dose for all adults aged 65 years and older.  Meningococcal vaccine.** / Consult your health care provider.  Hepatitis A vaccine.** /  Consult your health care provider.  Hepatitis B vaccine.** / Consult your health care provider.  Haemophilus influenzae type b (Hib) vaccine.** / Consult your health care provider. **Family history and personal history of risk and conditions may change your health care provider's recommendations. Document Released: 06/26/2001 Document Revised: 05/05/2013 Document Reviewed: 09/25/2010 ExitCare Patient Information 2015 [employer]. This information is not intended to replace advice given to you by your health care provider. Make sure you discuss any questions you have with your health care provider.   

## 2016-03-14 NOTE — Progress Notes (Signed)
Subjective:    Patient ID: Manuel Petty, male    DOB: [birth year], 70 y.o.   MRN: [id number]  DOS:  03/14/2016 Type of visit - description : CPX Interval history: Feeling great, no major concerns.    Review of Systems Constitutional: No fever. No chills. No unexplained wt changes. No unusual sweats  HEENT: No dental problems, no ear discharge, no facial swelling, no voice changes. No eye discharge, no eye  redness , no  intolerance to light   Respiratory: No wheezing , no  difficulty breathing. No cough , no mucus production  Cardiovascular: No CP, no leg swelling , no  Palpitations  GI: no nausea, no vomiting, no diarrhea , no  abdominal pain.  No blood in the stools. No dysphagia, no odynophagia    Endocrine: No polyphagia, no polyuria , no polydipsia  GU: No dysuria, gross hematuria, difficulty urinating. No urinary urgency, no frequency.  Musculoskeletal: No joint swellings or unusual aches or pains  Skin: No change in the color of the skin, palor , no  Rash  Allergic, immunologic: No environmental allergies , no  food allergies  Neurological: No dizziness no  syncope. No headaches. No diplopia, no slurred, no slurred speech, no motor deficits, no facial  Numbness  Hematological: No enlarged lymph nodes, no easy bruising , no unusual bleedings  Psychiatry: No suicidal ideas, no hallucinations, no beavior problems, no confusion.  No unusual/severe anxiety, no depression   Past Medical History:  Diagnosis Date  . Diabetes mellitus ([place]) 2,000  . Erectile dysfunction   . HTN (hypertension)   . Hyperlipidemia   . Personal history of colonic adenomas 07/18/2009  . Renal cancer ([place])    1992, has 1 kidney ; does not see urology any more    Past Surgical History:  Procedure Laterality Date  . NEPHRECTOMY  1992    Social History   Social History  . Marital status: Married    Spouse name: N/A  . Number of children: 1  . Years of education: N/A    Occupational History  . retired     Social History Main Topics  . Smoking status: Former Research scientist (life sciences)  . Smokeless tobacco: Former Systems developer     Comment: quit in 1992, 2 ppd  . Alcohol use 0.0 oz/week     Comment: beer sometimes  . Drug use: No     Comment: used to use marihuana    . Sexual activity: Not on file   Other Topics Concern  . Not on file   Social History Narrative   Lives w/ wife   Family History  Problem Relation Age of Onset  . Diabetes Father   . Hypertension Father   . CAD Father     F (MI age 26), Brother in [place] 44  . Prostate cancer Brother     dx age 63  . Colon cancer Neg Hx   . Stroke Neg Hx         Medication List       Accurate as of 03/14/16 11:59 PM. Always use your most recent med list.          accu-chek softclix lancets Use as instructed   aspirin 81 MG tablet Take 81 mg by mouth daily.   B-D ULTRAFINE III SHORT PEN 31G X 8 MM Misc Generic drug:  Insulin Pen Needle Use 4 times daily as  directed   FREESTYLE LITE Devi Use to check blood sugar 5 times per day.  glucose blood test strip Commonly known as:  FREESTYLE LITE Use to check blood sugar 5 times per day   insulin regular 100 units/mL injection Commonly known as:  NOVOLIN R Inject 0.68 mLs (68 Units total) into the skin daily.   liraglutide 18 MG/3ML Sopn Commonly known as:  VICTOZA Inject subcutaneously 1.65m daily   lisinopril 5 MG tablet Commonly known as:  PRINIVIL,ZESTRIL Take 1 tablet by mouth  daily   metFORMIN 1000 MG tablet Commonly known as:  GLUCOPHAGE Take 1 tablet by mouth  twice a day with meals   niacin 500 MG tablet Commonly known as:  SLO-NIACIN Take 500 mg by mouth 2 (two) times daily.   V-GO 30 Kit Use one per day          Objective:   Physical Exam BP 132/74 (BP Location: Left Arm, Patient Position: Sitting, Cuff Size: Normal)   Pulse 72   Temp 98.1 F (36.7 C) (Oral)   Resp 14   Ht _0  (1.778 m)   Wt 182 lb 6 oz (82.7 kg)    SpO2 94%   BMI 26.17 kg/m   General:   Well developed, well nourished . NAD.  Neck: No  thyromegaly  HEENT:  Normocephalic . Face symmetric, atraumatic Lungs:  CTA B Normal respiratory effort, no intercostal retractions, no accessory muscle use. Heart: RRR,  no murmur.  No pretibial edema bilaterally  Abdomen:  Not distended, soft, non-tender. No rebound or rigidity.   Skin: Exposed areas without rash. Not pale. Not jaundice Neurologic:  alert & oriented X3.  Speech normal, gait appropriate for age and unassisted Strength symmetric and appropriate for age.  Psych: Cognition and judgment appear intact.  Cooperative with normal attention span and concentration.  Behavior appropriate. No anxious or depressed appearing.    Assessment & Plan:   Assessment DM--dr KDwyane DeeHTN Hyperlipidemia. E.D. Renal cancer, 1992, nephrectomy x1,, released from urology  PLAN: DM: Under excellent control with metformin and insulin. Lately has not been able to afford Victoza. He already notify his endocrinologist HTN: on Lisinopril, controlled. Last BMP satisfactory Hyperlipidemia: On niacin, LDL excellent, HDL is slightly low, recommend increase his physical activity RTC one year

## 2016-03-14 NOTE — Assessment & Plan Note (Addendum)
Td 2009;  pneumonia shot 2016, prevnar 2016; zostavax - 2012 Flu shot was done   colonoscopy 3-11, multiple polyps, next colonoscopy due , discussed risk of cancer , elected to postpone cscope   Prostate ca screening: bro dx w/ prostate ca age 68, all PSAs wnl, reassess 2018 Counseled: diet and exercise , fall prevention. Has a healthcare power of attorney + FH CAD: asx, on ASA labs: CBC and TSH

## 2016-03-14 NOTE — Progress Notes (Signed)
Pre visit review using our clinic review tool, if applicable. No additional management support is needed unless otherwise documented below in the visit note. 

## 2016-03-15 LAB — CBC WITH DIFFERENTIAL/PLATELET
BASOS ABS: 0 10*3/uL (ref 0.0–0.1)
BASOS PCT: 0.4 % (ref 0.0–3.0)
EOS ABS: 0.3 10*3/uL (ref 0.0–0.7)
Eosinophils Relative: 4.5 % (ref 0.0–5.0)
HCT: 42.7 % (ref 39.0–52.0)
Hemoglobin: 14.4 g/dL (ref 13.0–17.0)
LYMPHS PCT: 24.5 % (ref 12.0–46.0)
Lymphs Abs: 1.7 10*3/uL (ref 0.7–4.0)
MCHC: 33.7 g/dL (ref 30.0–36.0)
MCV: 95.1 fl (ref 78.0–100.0)
MONO ABS: 0.6 10*3/uL (ref 0.1–1.0)
Monocytes Relative: 8.4 % (ref 3.0–12.0)
NEUTROS ABS: 4.3 10*3/uL (ref 1.4–7.7)
NEUTROS PCT: 62.2 % (ref 43.0–77.0)
PLATELETS: 281 10*3/uL (ref 150.0–400.0)
RBC: 4.48 Mil/uL (ref 4.22–5.81)
RDW: 13.4 % (ref 11.5–15.5)
WBC: 7 10*3/uL (ref 4.0–10.5)

## 2016-03-15 LAB — TSH: TSH: 0.74 u[IU]/mL (ref 0.35–4.50)

## 2016-03-16 NOTE — Assessment & Plan Note (Signed)
DM: Under excellent control with metformin and insulin. Lately has not been able to afford Victoza. He already notify his endocrinologist HTN: on Lisinopril, controlled. Last BMP satisfactory Hyperlipidemia: On niacin, LDL excellent, HDL is slightly low, recommend increase his physical activity RTC one year

## 2016-03-22 ENCOUNTER — Other Ambulatory Visit: Payer: Self-pay

## 2016-03-22 MED ORDER — METFORMIN HCL 1000 MG PO TABS: ORAL_TABLET | ORAL | 1 refills | Status: DC

## 2016-04-10 ENCOUNTER — Other Ambulatory Visit: Payer: Self-pay

## 2016-04-10 MED ORDER — V-GO 30 KIT: PACK | 3 refills | Status: DC

## 2016-05-15 ENCOUNTER — Other Ambulatory Visit (INDEPENDENT_AMBULATORY_CARE_PROVIDER_SITE_OTHER): Payer: PPO

## 2016-05-15 DIAGNOSIS — Z794 Long term (current) use of insulin: Secondary | ICD-10-CM | POA: Diagnosis not present

## 2016-05-15 DIAGNOSIS — E1165 Type 2 diabetes mellitus with hyperglycemia: Secondary | ICD-10-CM

## 2016-05-15 LAB — COMPREHENSIVE METABOLIC PANEL
ALBUMIN: 4.3 g/dL (ref 3.5–5.2)
ALT: 37 U/L (ref 0–53)
AST: 20 U/L (ref 0–37)
Alkaline Phosphatase: 78 U/L (ref 39–117)
BILIRUBIN TOTAL: 0.4 mg/dL (ref 0.2–1.2)
BUN: 14 mg/dL (ref 6–23)
CALCIUM: 8.7 mg/dL (ref 8.4–10.5)
CHLORIDE: 107 meq/L (ref 96–112)
CO2: 26 mEq/L (ref 19–32)
CREATININE: 0.83 mg/dL (ref 0.40–1.50)
GFR: 97.87 mL/min (ref >60.00)
Glucose, Bld: 96 mg/dL (ref 70–99)
POTASSIUM: 4.1 meq/L (ref 3.5–5.1)
SODIUM: 139 meq/L (ref 135–145)
TOTAL PROTEIN: 6.8 g/dL (ref 6.0–8.3)

## 2016-05-15 LAB — HEMOGLOBIN A1C: Hgb A1c MFr Bld: 6.7 % — ABNORMAL HIGH (ref 4.6–6.5)

## 2016-05-29 ENCOUNTER — Ambulatory Visit (INDEPENDENT_AMBULATORY_CARE_PROVIDER_SITE_OTHER): Payer: PPO | Admitting: Endocrinology

## 2016-05-29 ENCOUNTER — Encounter: Payer: Self-pay | Admitting: Endocrinology

## 2016-05-29 VITALS — BP 142/74 | HR 70 | Ht 70.0 in | Wt 178.0 lb

## 2016-05-29 DIAGNOSIS — Z794 Long term (current) use of insulin: Secondary | ICD-10-CM

## 2016-05-29 DIAGNOSIS — E1165 Type 2 diabetes mellitus with hyperglycemia: Secondary | ICD-10-CM

## 2016-05-29 NOTE — Progress Notes (Signed)
Patient ID: Manuel Petty, male   DOB: Jul 09, 1945, 71 y.o.   MRN: [id number]   Reason for Appointment: follow-up diabetes  History of Present Illness   Diagnosis: Type 2 DIABETES MELITUS, date of diagnosis: 2007   Previous history: He had been on large doses of Lantus before he was given Victoza in addition and this improved his blood sugar control and reduced his insulin requirement However he subsequently started requiring mealtime coverage with large meals also  RECENT history:  Insulin regimen: [employer] brand Regular Insulin  V-go 30 units basal, mealtime boluses Bfst 2-3; 3-4 lunch, dinner 4-5 clicks  His N8M was relatively higher at 7.6% when he was switched to the V-go pump and his A1c is now better at 6.4, previously 6.8  He has so far had fairly good control in the pump and is liking the day-to-day management including bolusing  Current blood sugar patterns and problems identified:  Fasting readings are excellent with average somewhat better than the last time and having only mild fluctuation with only one low normal reading waking up  He did have a low sugar overnight recently possibly from eating a lighter supper  He was told to adjust his mealtime boluses based on what he is eating and he is somewhat compliant with this, will take only one click for eating a salad.  However he has had high readings last month from not being consistent with diet  He does try to bolus 20 minutes before eating but does not always rumored to do this  Despite reminders he does not exercise and has not lost weight  He still does not want to go to Victoza again because of the cost  HYPOGLYCEMIA: Lowest reading 49 at 1 AM and has only a single reading that is low before supper He does like the V-go pump and wants to continue   Non-insulin hypoglycemic drugs: Metformin 1 g twice a day      Side effects from medications: None  Proper timing of medications in relation to  meals: Yes.          Monitors blood glucose:  3-4 times a day.    Glucometer:  FreeStyle           Blood Glucose readings:    Mean values apply above for all meters except median for One Touch  PRE-MEAL Fasting Midday  Dinner Bedtime Overall  Glucose range: 67-155    81-239  41-239   Mean/median: 113  120  136  155  132           Meals:2- 3 meals per day. Lunch 2-3 pm; dinner 6 pm.  For breakfast he will either eat oatmeal or eggs, Bedtime snack, sometimes has ice cream, cereal         Exercise: minimal  Dietician visit: Most recent: 11/6718           Complications: are: None     Wt Readings from Last 3 Encounters:  05/29/16 178 lb (80.7 kg)  03/14/16 182 lb 6 oz (82.7 kg)  02/21/16 182 lb 9.6 oz (82.8 kg)      Lab Results  Component Value Date   HGBA1C 6.7 (H) 05/15/2016   HGBA1C 6.4 02/14/2016   HGBA1C 6.8 (H) 10/11/2015   Lab Results  Component Value Date   MICROALBUR 8.2 (H) 10/11/2015   LDLCALC 65 10/11/2015   CREATININE 0.83 05/15/2016   Other active problems discussed in review of systems   Allergies as  of 05/29/2016   No Known Allergies     Medication List       Accurate as of 05/29/16  1:52 PM. Always use your most recent med list.          accu-chek softclix lancets Use as instructed   aspirin 81 MG tablet Take 81 mg by mouth daily.   B-D ULTRAFINE III SHORT PEN 31G X 8 MM Misc Generic drug:  Insulin Pen Needle Use 4 times daily as  directed   FREESTYLE LITE Devi Use to check blood sugar 5 times per day.   glucose blood test strip Commonly known as:  FREESTYLE LITE Use to check blood sugar 5 times per day   insulin regular 250 units/2.18m (100 units/mL) injection Commonly known as:  NOVOLIN R Inject 0.68 mLs (68 Units total) into the skin daily.   lisinopril 5 MG tablet Commonly known as:  PRINIVIL,ZESTRIL Take 1 tablet by mouth  daily   metFORMIN 1000 MG tablet Commonly known as:  GLUCOPHAGE Take 1 tablet by mouth  twice a day  with meals   niacin 500 MG tablet Commonly known as:  SLO-NIACIN Take 500 mg by mouth 2 (two) times daily.   V-GO 30 Kit Use one per day       Allergies: No Known Allergies  Past Medical History:  Diagnosis Date  . [place]  . Erectile dysfunction   . HTN (hypertension)   . Hyperlipidemia   . Personal history of colonic adenomas 07/18/2009  . [place] ([place]    1992, has 1 kidney ; does not see urology any more    Past Surgical History:  Procedure Laterality Date  . NEPHRECTOMY  1992    Family History  Problem Relation Age of Onset  . Diabetes Father   . Hypertension Father   . CAD Father     F (MI age 69, Brother in [place] 673 . Prostate cancer Brother     dx age 69 . Colon cancer Neg Hx   . Stroke Neg Hx     Social History:  reports that he has quit smoking. He has quit using smokeless tobacco. He reports that he drinks alcohol. He reports that he does not use drugs.  Review of Systems -     HYPERLIPIDEMIA:   He is only on Slo-Niacin 500 mg twice a day with adequate control of LDL and triglycerides  HDL has been in the 20s previously along with small LDL particles  Particle number was below 1000 in 06/2014 but still has increased small particles  Again his HDL is still low , triglycerides borderline at 150   Lab Results  Component Value Date   CHOL 120 10/11/2015   HDL 24.40 (L) 10/11/2015   LDLCALC 65 10/11/2015   LDLDIRECT 45.7 05/31/2009   TRIG 151.0 (H) 10/11/2015   CHOLHDL 5 10/11/2015    He has mild hypertension  and is Usually controlled on 5 mg lisinopril   He monitors BP at home, and blood pressure readings are In the 1888Ksystolic  BP Readings from Last 3 Encounters:  05/29/16 (!) 142/74  03/14/16 132/74  02/21/16 133/73   No recent problems with electrolytes or renal function  Lab Results  Component Value Date   CREATININE 0.83 05/15/2016   BUN 14 05/15/2016   NA 139 05/15/2016   K 4.1 05/15/2016   CL  107 05/15/2016   CO2 26 05/15/2016    Eye exams: Annual 8/17  Diabetic foot exam was done in 06/2015   Examination:   BP (!) 142/74   Pulse 70   Ht 5' 10"  (1.778 m)   Wt 178 lb (80.7 kg)   SpO2 97%   BMI 25.54 kg/m   Body mass index is 25.54 kg/m.     Assesment/Plan:   Diabetes type 2, nonobese: See history of present illness for detailed discussion of his current blood sugar patterns, problems identified   With the V-go pump he is getting consistent control and A1c is below 7 again Did go off the diet and little last month and A1c is slightly higher at 6.7 However his blood sugars are averaging fairly good throughout the day with some fluctuation He will only rarely get hypoglycemia when he has variable diet or not adjusting boluses Also may have relatively long duration of action of the regular insulin he is using  He does need to get back into exercise  HYPERTENSION: Blood pressure is borderline, consider increasing lisinopril  Lipids: to be checked on the next visit  Follow-up in 3 months with A1c    Patient Instructions  Exercise  Counseling time on subjects discussed above is over 50% of today's 25 minute visit   Manuel Petty 05/29/2016, 1:52 PM

## 2016-05-29 NOTE — Patient Instructions (Signed)
Exercise

## 2016-07-02 ENCOUNTER — Encounter: Payer: Self-pay | Admitting: Internal Medicine

## 2016-07-13 ENCOUNTER — Encounter: Payer: Self-pay | Admitting: Endocrinology

## 2016-07-13 ENCOUNTER — Telehealth: Payer: Self-pay | Admitting: Endocrinology

## 2016-07-13 NOTE — Telephone Encounter (Signed)
Patient ask you to send over a prescription for the vgo [place], [place]. [phone] (Phone) [phone] (Fax)

## 2016-07-16 ENCOUNTER — Other Ambulatory Visit: Payer: Self-pay

## 2016-07-16 MED ORDER — V-GO 30 KIT: PACK | 3 refills | Status: DC

## 2016-07-26 ENCOUNTER — Other Ambulatory Visit: Payer: Self-pay

## 2016-07-26 MED ORDER — V-GO 30 KIT: PACK | 3 refills | Status: DC

## 2016-07-31 ENCOUNTER — Telehealth: Payer: Self-pay | Admitting: Internal Medicine

## 2016-07-31 NOTE — Telephone Encounter (Signed)
Left pt message asking to call Allison back directly at [phone] to schedule AWV. Thanks! °

## 2016-08-01 ENCOUNTER — Telehealth: Payer: Self-pay

## 2016-08-01 NOTE — Telephone Encounter (Signed)
-----   Message from Grant Fontana, RN sent at 08/01/2016 11:37 AM EDT ----- Regarding: insulin pump Hey looks like pt on insulin pump ,  Please send note to MD handling pump for instructions.  Colon set for 4/11 with CG Thanks, angela/PV

## 2016-08-01 NOTE — Telephone Encounter (Signed)
Insulin pump letter sent to Dr Elayne Snare to advise instructions for patients upcoming colonoscopy on 08/22/16.

## 2016-08-02 NOTE — Telephone Encounter (Signed)
Dr Dwyane Dee the patient will be on clear liquids the day before and up until 3 hours before his procedure at 11:00AM on 08/22/16.  Thanks for your help Sir.

## 2016-08-02 NOTE — Telephone Encounter (Signed)
Please tell patient at his pre-visit to call Dr Dwyane Dee regarding dosing for his procedure.

## 2016-08-02 NOTE — Telephone Encounter (Signed)
His ablation going to be on a liquid diet for 24 hours before the procedure? Most likely will have to switch his pump to Lantus and regular insulin with injections and we can give him the doses

## 2016-08-08 ENCOUNTER — Ambulatory Visit (AMBULATORY_SURGERY_CENTER): Payer: Self-pay

## 2016-08-08 VITALS — Ht 70.0 in | Wt 177.6 lb

## 2016-08-08 DIAGNOSIS — Z860100 Personal history of colon polyps, unspecified: Secondary | ICD-10-CM

## 2016-08-08 NOTE — Progress Notes (Signed)
No allergies to eggs or soy No past problems with anesthesia No home oxygen No diet meds  Declined emmi 

## 2016-08-08 NOTE — Telephone Encounter (Signed)
Patient ask you to give him a call °

## 2016-08-09 ENCOUNTER — Encounter: Payer: Self-pay | Admitting: Internal Medicine

## 2016-08-13 ENCOUNTER — Telehealth: Payer: Self-pay

## 2016-08-13 NOTE — Telephone Encounter (Signed)
Scheduled for colonoscopy on 08/22/16 and was told that he needs to use lantus and patient needs to know how much and exactly what day to start this please advise

## 2016-08-13 NOTE — Telephone Encounter (Signed)
He will disconnect his V-go pump the morning before colonoscopy and take 20 units of Lantus and regular insulin 3-5 units before eating based on blood sugar.  On the morning of colonoscopy can take 15 units of Lantus only and resume the pump when he is eating again

## 2016-08-13 NOTE — Telephone Encounter (Signed)
Spoke with Manuel Petty and he is going to reach out to Dr Ronnie Derby office to get the pump instructions for his upcoming procedure.  If there is any problems he will call me back.

## 2016-08-13 NOTE — Telephone Encounter (Signed)
Because he will be drinking juices and other drinks which contain sugar

## 2016-08-13 NOTE — Telephone Encounter (Signed)
Patient is confused because he has to fast the day before so he will not be eating anything so why would he be taking 3-5 units of insulin please advise

## 2016-08-14 ENCOUNTER — Other Ambulatory Visit: Payer: Self-pay

## 2016-08-14 MED ORDER — V-GO 30 KIT: PACK | 3 refills | Status: DC

## 2016-08-14 NOTE — Telephone Encounter (Signed)
Gave patient detailed instructions and he stated an understanding

## 2016-08-22 ENCOUNTER — Encounter: Payer: Self-pay | Admitting: Internal Medicine

## 2016-08-22 ENCOUNTER — Ambulatory Visit (AMBULATORY_SURGERY_CENTER): Payer: PPO | Admitting: Internal Medicine

## 2016-08-22 VITALS — BP 120/80 | HR 74 | Temp 98.0°F | Resp 16 | Ht 70.0 in | Wt 177.0 lb

## 2016-08-22 DIAGNOSIS — Z1211 Encounter for screening for malignant neoplasm of colon: Secondary | ICD-10-CM | POA: Diagnosis not present

## 2016-08-22 DIAGNOSIS — Z860100 Personal history of colon polyps, unspecified: Secondary | ICD-10-CM

## 2016-08-22 DIAGNOSIS — K635 Polyp of colon: Secondary | ICD-10-CM

## 2016-08-22 DIAGNOSIS — D120 Benign neoplasm of cecum: Secondary | ICD-10-CM

## 2016-08-22 MED ORDER — SODIUM CHLORIDE 0.9 % IV SOLN: 500.0000 mL | INTRAVENOUS | Status: DC

## 2016-08-22 NOTE — Progress Notes (Signed)
Called to room to assist during endoscopic procedure.  Patient ID and intended procedure confirmed with present staff. Received instructions for my participation in the procedure from the performing physician.  

## 2016-08-22 NOTE — Progress Notes (Signed)
A/ox3 pleased with MAC, report to [employer]

## 2016-08-22 NOTE — Patient Instructions (Signed)
YOU HAD AN ENDOSCOPIC PROCEDURE TODAY AT [facility]:   Refer to the procedure report that was given to you for any specific questions about what was found during the examination.  If the procedure report does not answer your questions, please call your gastroenterologist to clarify.  If you requested that your care partner not be given the details of your procedure findings, then the procedure report has been included in a sealed envelope for you to review at your convenience later.  YOU SHOULD EXPECT: Some feelings of bloating in the abdomen. Passage of more gas than usual.  Walking can help get rid of the air that was put into your GI tract during the procedure and reduce the bloating. If you had a lower endoscopy (such as a colonoscopy or flexible sigmoidoscopy) you may notice spotting of blood in your stool or on the toilet paper. If you underwent a bowel prep for your procedure, you may not have a normal bowel movement for a few days.  Please Note:  You might notice some irritation and congestion in your nose or some drainage.  This is from the oxygen used during your procedure.  There is no need for concern and it should clear up in a day or so.  SYMPTOMS TO REPORT IMMEDIATELY:   Following lower endoscopy (colonoscopy or flexible sigmoidoscopy):  Excessive amounts of blood in the stool  Significant tenderness or worsening of abdominal pains  Swelling of the abdomen that is new, acute  Fever of 100F or higher  For urgent or emergent issues, a gastroenterologist can be reached at any hour by calling [phone].   DIET:  We do recommend a small meal at first, but then you may proceed to your regular diet.  Drink plenty of fluids but you should avoid alcoholic beverages for 24 hours.  ACTIVITY:  You should plan to take it easy for the rest of today and you should NOT DRIVE or use heavy machinery until tomorrow (because of the sedation medicines used during the test).     FOLLOW UP: Our staff will call the number listed on your records the next business day following your procedure to check on you and address any questions or concerns that you may have regarding the information given to you following your procedure. If we do not reach you, we will leave a message.  However, if you are feeling well and you are not experiencing any problems, there is no need to return our call.  We will assume that you have returned to your regular daily activities without incident.  If any biopsies were taken you will be contacted by phone or by letter within the next 1-3 weeks.  Please call us at [phone] if you have not heard about the biopsies in 3 weeks.   SIGNATURES/CONFIDENTIALITY: You and/or your care partner have signed paperwork which will be entered into your electronic medical record.  These signatures attest to the fact that that the information above on your After Visit Summary has been reviewed and is understood.  Full responsibility of the confidentiality of this discharge information lies with you and/or your care-partner.  Await pathology  Please read over handout about polyps  Continue your normal medications

## 2016-08-22 NOTE — Progress Notes (Signed)
Upon admission, patient has increased heart rate and elevated BP.  Placed on the monitor and patient is in NSR with PAC's.  HR 98-110.  BP 168/103.  Reported to Dr. Carlean Purl and Osvaldo Angst, CRNA.  Will plan to proceed with procedure.  Pt verbalized understanding.

## 2016-08-22 NOTE — Progress Notes (Signed)
Pt's states no medical or surgical changes since previsit or office visit. 

## 2016-08-22 NOTE — Op Note (Signed)
[place] Patient Name: Manuel Petty Procedure Date: 08/22/2016 11:18 AM MRN: [id number] Endoscopist: Gatha Mayer , MD Age: 71 Referring MD:  Date of Birth: [birth year] Gender: Male Account #: [id number] Procedure:                Colonoscopy Indications:              Surveillance: Personal history of adenomatous                            polyps on last colonoscopy > 5 years ago Medicines:                Propofol per Anesthesia, Monitored Anesthesia Care Procedure:                Pre-Anesthesia Assessment:                           - Prior to the procedure, a History and Physical                            was performed, and patient medications and                            allergies were reviewed. The patient's tolerance of                            previous anesthesia was also reviewed. The risks                            and benefits of the procedure and the sedation                            options and risks were discussed with the patient.                            All questions were answered, and informed consent                            was obtained. Prior Anticoagulants: The patient has                            taken no previous anticoagulant or antiplatelet                            agents. ASA Grade Assessment: II - A patient with                            mild systemic disease. After reviewing the risks                            and benefits, the patient was deemed in                            satisfactory condition to undergo the procedure.  After obtaining informed consent, the colonoscope                            was passed under direct vision. Throughout the                            procedure, the patient's blood pressure, pulse, and                            oxygen saturations were monitored continuously. The                            Colonoscope was introduced through the anus and       advanced to the the cecum, identified by                            appendiceal orifice and ileocecal valve. The                            colonoscopy was performed without difficulty. The                            patient tolerated the procedure well. The quality                            of the bowel preparation was excellent. The bowel                            preparation used was Miralax. The ileocecal valve,                            appendiceal orifice, and rectum were photographed. Scope In: 11:28:25 AM Scope Out: 11:47:34 AM Scope Withdrawal Time: 0 hours 16 minutes 17 seconds  Total Procedure Duration: 0 hours 19 minutes 9 seconds  Findings:                 The perianal and digital rectal examinations were                            normal. Pertinent negatives include normal prostate                            (size, shape, and consistency).                           A 2 mm polyp was found in the cecum. The polyp was                            sessile. The polyp was removed with a cold biopsy                            forceps. Resection and retrieval were complete.  Verification of patient identification for the                            specimen was done. Estimated blood loss was minimal.                           The exam was otherwise without abnormality on                            direct and retroflexion views. Complications:            No immediate complications. Estimated Blood Loss:     Estimated blood loss was minimal. Impression:               - One 2 mm polyp in the cecum, removed with a cold                            biopsy forceps. Resected and retrieved.                           - The examination was otherwise normal on direct                            and retroflexion views.                           - Personal history of colonic polyps. 5 adenomas                            2011 Recommendation:           - Patient has a  contact number available for                            emergencies. The signs and symptoms of potential                            delayed complications were discussed with the                            patient. Return to normal activities tomorrow.                            Written discharge instructions were provided to the                            patient.                           - Continue present medications.                           - Repeat colonoscopy is recommended for                            surveillance. The colonoscopy date will be  determined after pathology results from today's                            exam become available for review.                           - Resume previous diet. Gatha Mayer, MD 08/22/2016 12:54:28 PM This report has been signed electronically.

## 2016-08-23 ENCOUNTER — Telehealth: Payer: Self-pay | Admitting: *Deleted

## 2016-08-23 NOTE — Telephone Encounter (Signed)
  Follow up Call-  Call back number 08/22/2016  Post procedure Call Back phone  # [phone]  Permission to leave phone message Yes  Some recent data might be hidden     Patient questions:  Do you have a fever, pain , or abdominal swelling? No. Pain Score  0 *  Have you tolerated food without any problems? Yes.    Have you been able to return to your normal activities? Yes.    Do you have any questions about your discharge instructions: Diet   No. Medications  No. Follow up visit  No.  Do you have questions or concerns about your Care? No.  Actions: * If pain score is 4 or above: No action needed, pain <4.

## 2016-08-24 ENCOUNTER — Encounter: Payer: Self-pay | Admitting: Endocrinology

## 2016-08-24 ENCOUNTER — Other Ambulatory Visit: Payer: Self-pay

## 2016-08-24 MED ORDER — LISINOPRIL 5 MG PO TABS: ORAL_TABLET | ORAL | 1 refills | Status: DC

## 2016-08-28 ENCOUNTER — Encounter: Payer: Self-pay | Admitting: Internal Medicine

## 2016-08-30 ENCOUNTER — Telehealth: Payer: Self-pay | Admitting: Internal Medicine

## 2016-08-30 NOTE — Telephone Encounter (Signed)
Relation to EF:EOFH Call back number:[phone]   Reason for call:  Elayne Snare, MD [facility] Endocrinology advised patient to schedule lab appointment within our office, orders reflected in chart

## 2016-08-30 NOTE — Telephone Encounter (Signed)
Okay to schedule appt at [facility].

## 2016-08-30 NOTE — Telephone Encounter (Signed)
Patient scheduled 09/25/2016 lab only

## 2016-08-31 ENCOUNTER — Encounter: Payer: Self-pay | Admitting: Internal Medicine

## 2016-08-31 NOTE — Progress Notes (Signed)
Benign mucosal polyp Had 5 adenomas 2011 so recall 5 yrs 2023 My Chart letter

## 2016-09-15 NOTE — Telephone Encounter (Signed)
Left pt message asking to call Allison back directly at [phone] to schedule AWV. Thanks! °

## 2016-09-24 NOTE — Progress Notes (Unsigned)
 Subjective:   Manuel Petty is a 71 y.o. male who presents for Medicare Annual/Subsequent preventive examination.  Review of Systems:  No ROS.  Medicare Wellness Visit.   Sleep patterns:    Home Safety/Smoke Alarms:   Living environment; residence and Firearm Safety:  Seat Belt Safety/Bike Helmet: Wears seat belt.   Counseling:   Eye Exam-  Dental-  Male:   CCS-  Last 08/22/16:  Polyp removed this time was not precancerous. Repeat screening in 5yrs per report letter. PSA-  Lab Results  Component Value Date   PSA 0.47 11/04/2014   PSA 1.30 10/19/2013   PSA 1.39 06/06/2012       Objective:    Vitals: There were no vitals taken for this visit.  There is no height or weight on file to calculate BMI.  Tobacco History  Smoking Status  . Former Smoker  Smokeless Tobacco  . Never Used    Comment: quit in 1992, 2 ppd     Counseling given: Not Answered   Past Medical History:  Diagnosis Date  . Diabetes mellitus (HCC) 2,000  . Erectile dysfunction   . HTN (hypertension)   . Hyperlipidemia   . Personal history of colonic adenomas 07/18/2009  . Renal cancer (HCC)    1992, has 1 kidney ; does not see urology any more   Past Surgical History:  Procedure Laterality Date  . COLONOSCOPY    . NEPHRECTOMY  1992   Family History  Problem Relation Age of Onset  . Diabetes Father   . Hypertension Father   . CAD Father        F (MI age 58), Brother in [place] 60  . Prostate cancer Brother        dx age 71  . Colon cancer Neg Hx   . Stroke Neg Hx    History  Sexual Activity  . Sexual activity: Not on file    Outpatient Encounter Prescriptions as of 09/25/2016  Medication Sig  . aspirin 81 MG tablet Take 81 mg by mouth daily.  . B-D ULTRAFINE III SHORT PEN 31G X 8 MM MISC Use 4 times daily as  directed  . Blood Glucose Monitoring Suppl (FREESTYLE LITE) DEVI Use to check blood sugar 5 times per day.  . glucose blood (FREESTYLE LITE) test strip Use to check blood  sugar 5 times per day  . Insulin Disposable Pump (V-GO 30) KIT Use one per day  . insulin glargine (LANTUS) 100 UNIT/ML injection Inject 15 Units into the skin once.  . insulin regular (NOVOLIN R) 100 units/mL injection Inject 0.68 mLs (68 Units total) into the skin daily.  . Lancet Devices (ACCU-CHEK SOFTCLIX) lancets Use as instructed  . lisinopril (PRINIVIL,ZESTRIL) 5 MG tablet Take 1 tablet by mouth  daily  . metFORMIN (GLUCOPHAGE) 1000 MG tablet Take 1 tablet by mouth  twice a day with meals   No facility-administered encounter medications on file as of 09/25/2016.     Activities of Daily Living In your present state of health, do you have any difficulty performing the following activities: 03/14/2016  Hearing? N  Vision? N  Difficulty concentrating or making decisions? N  Walking or climbing stairs? N  Dressing or bathing? N  Doing errands, shopping? N  Some recent data might be hidden    Patient Care Team: Paz, Jose E, MD as PCP - General Mazzarella, Leslie A, OD (Ophthalmology) Kumar, Ajay, MD as Consulting Physician (Endocrinology)   Assessment:      Physical assessment deferred to PCP.  Exercise Activities and Dietary recommendations   Diet (meal preparation, eat out, water intake, caffeinated beverages, dairy products, fruits and vegetables): {Desc; diets:16563} Breakfast: Lunch:  Dinner:      Goals    None     Fall Risk Fall Risk  03/14/2016 11/04/2014 10/19/2013  Falls in the past year? No No No   Depression Screen PHQ 2/9 Scores 03/14/2016 11/04/2014 10/19/2013  PHQ - 2 Score 0 0 0  Exception Documentation - Patient refusal -    Cognitive Function        Immunization History  Administered Date(s) Administered  . Influenza Whole 06/07/2009, 02/19/2010  . Influenza, High Dose Seasonal PF 02/22/2013, 03/25/2014, 03/28/2015, 02/21/2016  . Influenza, Seasonal, Injecte, Preservative Fre 06/05/2012  . Pneumococcal Conjugate-13 10/19/2013  . Pneumococcal  Polysaccharide-23 05/12/2002, 02/17/2008, 11/08/2014  . Td 08/27/2007  . Zoster 07/12/2010   Screening Tests Health Maintenance  Topic Date Due  . Hepatitis C Screening  06/28/1945  . FOOT EXAM  07/04/2016  . HEMOGLOBIN A1C  11/12/2016  . INFLUENZA VACCINE  12/12/2016  . OPHTHALMOLOGY EXAM  12/27/2016  . TETANUS/TDAP  08/26/2017  . COLONOSCOPY  08/22/2021  . PNA vac Low Risk Adult  Completed      Plan:   ***  I have personally reviewed and noted the following in the patient's chart:   . Medical and social history . Use of alcohol, tobacco or illicit drugs  . Current medications and supplements . Functional ability and status . Nutritional status . Physical activity . Advanced directives . List of other physicians . Hospitalizations, surgeries, and ER visits in previous 12 months . Vitals . Screenings to include cognitive, depression, and falls . Referrals and appointments  In addition, I have reviewed and discussed with patient certain preventive protocols, quality metrics, and best practice recommendations. A written personalized care plan for preventive services as well as general preventive health recommendations were provided to patient.     ,  Angel, RN  09/24/2016   

## 2016-09-25 ENCOUNTER — Other Ambulatory Visit (INDEPENDENT_AMBULATORY_CARE_PROVIDER_SITE_OTHER): Payer: PPO

## 2016-09-25 ENCOUNTER — Ambulatory Visit: Payer: PPO | Admitting: Endocrinology

## 2016-09-25 DIAGNOSIS — Z794 Long term (current) use of insulin: Secondary | ICD-10-CM | POA: Diagnosis not present

## 2016-09-25 DIAGNOSIS — E1165 Type 2 diabetes mellitus with hyperglycemia: Secondary | ICD-10-CM

## 2016-09-25 LAB — BASIC METABOLIC PANEL
BUN: 14 mg/dL (ref 6–23)
CALCIUM: 9 mg/dL (ref 8.4–10.5)
CHLORIDE: 107 meq/L (ref 96–112)
CO2: 27 mEq/L (ref 19–32)
CREATININE: 0.82 mg/dL (ref 0.40–1.50)
GFR: 99.14 mL/min (ref >60.00)
Glucose, Bld: 99 mg/dL (ref 70–99)
Potassium: 4.5 mEq/L (ref 3.5–5.1)
Sodium: 139 mEq/L (ref 135–145)

## 2016-09-25 LAB — HEMOGLOBIN A1C: HEMOGLOBIN A1C: 6.7 % — AB (ref 4.6–6.5)

## 2016-09-25 LAB — MICROALBUMIN / CREATININE URINE RATIO
CREATININE, U: 98.2 mg/dL
MICROALB UR: 10.5 mg/dL — AB (ref 0.0–1.9)
Microalb Creat Ratio: 10.7 mg/g (ref 0.0–30.0)

## 2016-09-25 NOTE — Telephone Encounter (Signed)
Pt here for labs on 09/25/16. Pt declines AWV stating he is well and up to date on everything.

## 2016-10-11 ENCOUNTER — Other Ambulatory Visit: Payer: Self-pay

## 2016-10-11 ENCOUNTER — Encounter: Payer: Self-pay | Admitting: Endocrinology

## 2016-10-11 ENCOUNTER — Ambulatory Visit (INDEPENDENT_AMBULATORY_CARE_PROVIDER_SITE_OTHER): Payer: PPO | Admitting: Endocrinology

## 2016-10-11 ENCOUNTER — Other Ambulatory Visit: Payer: Self-pay | Admitting: Endocrinology

## 2016-10-11 VITALS — BP 151/83 | HR 78 | Ht 70.0 in | Wt 178.4 lb

## 2016-10-11 DIAGNOSIS — Z794 Long term (current) use of insulin: Secondary | ICD-10-CM | POA: Diagnosis not present

## 2016-10-11 DIAGNOSIS — I10 Essential (primary) hypertension: Secondary | ICD-10-CM

## 2016-10-11 DIAGNOSIS — E1165 Type 2 diabetes mellitus with hyperglycemia: Secondary | ICD-10-CM

## 2016-10-11 DIAGNOSIS — E782 Mixed hyperlipidemia: Secondary | ICD-10-CM | POA: Diagnosis not present

## 2016-10-11 MED ORDER — FREESTYLE LIBRE READER DEVI: 1.0000 | 0 refills | Status: DC

## 2016-10-11 MED ORDER — LISINOPRIL 10 MG PO TABS: 10.0000 mg | ORAL_TABLET | Freq: Every day | ORAL | 3 refills | Status: DC

## 2016-10-11 MED ORDER — FREESTYLE LIBRE SENSOR SYSTEM MISC: 3 refills | Status: DC

## 2016-10-11 NOTE — Patient Instructions (Signed)
May need 1 more click at meals and also with oatmeal

## 2016-10-11 NOTE — Progress Notes (Signed)
Patient ID: Manuel Petty, male   DOB: February 21, 1946, 71 y.o.   MRN: [id number]   Reason for Appointment: follow-up diabetes  History of Present Illness   Diagnosis: Type 2 DIABETES MELITUS, date of diagnosis: 2007   Previous history: He had been on large doses of Lantus before he was given Victoza in addition and this improved his blood sugar control and reduced his insulin requirement However he subsequently started requiring mealtime coverage with large meals also  RECENT history:  Insulin regimen: [employer] brand Regular Insulin  V-go 30 units basal, mealtime boluses Bfst 2-3; 3-4 lunch, dinner 4-5 clicks  His H0Q was relatively higher at 7.6% when he was switched to the V-go pump and his A1c is now around 6.7 He also prefers the pump compared to multiple injections Also not taking Victoza because of cost  Current blood sugar patterns and problems identified:  He reports that for the last month or so he has had overnight hypoglycemia with some readings in the 40s.  However his main symptoms are difficulty sleeping at that time and no acute symptomatology  Despite this his FASTING readings are fairly consistently near normal and occasionally high also  He is checking his blood sugars very consistently before each meal and bedtime and on an average 4.4 times a day  Blood sugars before lunch are minimally increased to overall, relatively better at suppertime  Postprandial readings after evening meal are variable with a range of 46-2 53  However he is not adjusting his BOLUSES based on what he is eating  In the morning he is sometimes getting more carbohydrate and does not compensate with an increased 2 units bolus  Although he is supposed to change his pump in the morning daily he is not understanding the need to change it every 24 hours and may go longer if he has some boluses leftover  Has had only a couple of low readings in the afternoons or evenings, mostly  in the last week  Despite reminders he does not like to exercise   Non-insulin hypoglycemic drugs: Metformin 1 g twice a day      Side effects from medications: None  Proper timing of medications in relation to meals: Yes.          Monitors blood glucose:  3-4 times a day.    Glucometer:  FreeStyle           Blood Glucose readings:    Mean values apply above for all meters except median for One Touch  PRE-MEAL Fasting Lunch Dinner Bedtime Overall  Glucose range: 74-180    46-2 53    Mean/median: 122  136  133  149  130          Meals:2- 3 meals per day. Lunch 2-3 pm; dinner 6 pm.  For breakfast he will either eat oatmeal or eggs, Bedtime snack, sometimes has ice cream, cereal         Exercise: some walking  Dietician visit: Most recent: 10/5782           Complications: are: None     Wt Readings from Last 3 Encounters:  10/11/16 178 lb 6.4 oz (80.9 kg)  08/22/16 177 lb (80.3 kg)  08/08/16 177 lb 9.6 oz (80.6 kg)      Lab Results  Component Value Date   HGBA1C 6.7 (H) 09/25/2016   HGBA1C 6.7 (H) 05/15/2016   HGBA1C 6.4 02/14/2016   Lab Results  Component Value Date  MICROALBUR 10.5 (H) 09/25/2016   LDLCALC 65 10/11/2015   CREATININE 0.82 09/25/2016   Other active problems discussed in review of systems   Allergies as of 10/11/2016   No Known Allergies     Medication List       Accurate as of 10/11/16  1:00 PM. Always use your most recent med list.          accu-chek softclix lancets Use as instructed   aspirin 81 MG tablet Take 81 mg by mouth daily.   B-D ULTRAFINE III SHORT PEN 31G X 8 MM Misc Generic drug:  Insulin Pen Needle Use 4 times daily as  directed   FREESTYLE LITE Devi Use to check blood sugar 5 times per day.   glucose blood test strip Commonly known as:  FREESTYLE LITE Use to check blood sugar 5 times per day   insulin glargine 100 UNIT/ML injection Commonly known as:  LANTUS Inject 15 Units into the skin once.   insulin  regular 100 units/mL injection Commonly known as:  NOVOLIN R Inject 0.68 mLs (68 Units total) into the skin daily.   lisinopril 10 MG tablet Commonly known as:  PRINIVIL,ZESTRIL Take 1 tablet (10 mg total) by mouth daily.   metFORMIN 1000 MG tablet Commonly known as:  GLUCOPHAGE Take 1 tablet by mouth  twice a day with meals   V-GO 30 Kit Use one per day       Allergies: No Known Allergies  Past Medical History:  Diagnosis Date  . Diabetes mellitus ([place]) 2,000  . Erectile dysfunction   . HTN (hypertension)   . Hyperlipidemia   . Personal history of colonic adenomas 07/18/2009  . Renal cancer ([place])    1992, has 1 kidney ; does not see urology any more    Past Surgical History:  Procedure Laterality Date  . COLONOSCOPY    . NEPHRECTOMY  1992    Family History  Problem Relation Age of Onset  . Diabetes Father   . Hypertension Father   . CAD Father        F (MI age 73), Brother in [place] 32  . Prostate cancer Brother        dx age 54  . Colon cancer Neg Hx   . Stroke Neg Hx     Social History:  reports that he has quit smoking. He has never used smokeless tobacco. He reports that he drinks about 1.2 oz of alcohol per week . He reports that he does not use drugs.  Review of Systems -     HYPERLIPIDEMIA:   He is only on Slo-Niacin 500 mg twice a day with adequate control of LDL and triglycerides  HDL has been in the 20s previously along with small LDL particles  Particle number was below 1000 in 06/2014 but still has increased small particles  Has not had a follow-up  Lab Results  Component Value Date   CHOL 120 10/11/2015   HDL 24.40 (L) 10/11/2015   LDLCALC 65 10/11/2015   LDLDIRECT 45.7 05/31/2009   TRIG 151.0 (H) 10/11/2015   CHOLHDL 5 10/11/2015    He has mild hypertension  and is In the past controlled on 5 mg lisinopril    He monitors BP at home, and blood pressure readings are In the 140/80ssystolic  BP Readings from Last 3 Encounters:    10/11/16 (!) 151/83  08/22/16 120/80  05/29/16 (!) 142/74   No recent problems with electrolytes or renal function  Lab  Results  Component Value Date   CREATININE 0.82 09/25/2016   BUN 14 09/25/2016   NA 139 09/25/2016   K 4.5 09/25/2016   CL 107 09/25/2016   CO2 27 09/25/2016    Eye exams: Annual 8/17   Diabetic foot exam was done in 06/2015   Examination:   BP (!) 151/83   Pulse 78   Ht _0  (1.778 m)   Wt 178 lb 6.4 oz (80.9 kg)   SpO2 97%   BMI 25.60 kg/m   Body mass index is 25.6 kg/m.     Assesment/Plan:   Diabetes type 2, nonobese: See history of present illness for detailed discussion of his current blood sugar patterns, problems identified   His A1c 6.7 However he is getting hypoglycemia overnight and occasionally before lunch and supper more recently This is despite fasting readings being minimally increased overall reflecting Dawn phenomenon Since he is not getting consistent basal with not changing his V-go pump at the same time he appears to be needing a lower overall basal amount He will be given a sample of the 20 unit V-go pump and he will let us know by Monday if this controls his blood sugars better without hypoglycemia Also discussed possibility of increasing boluses when his basal is reduced Explained to the patient the need for changing the V-go pump every 24 hours consistently He is also needing to have protein at breakfast consistently otherwise needs to more units for higher carbohydrate meals Discussed generally adjusting lunch and supper boluses based on the number of carbohydrate servings or high fat content  Explained to him the use of the freestyle [place] system in detail and he will look into this since this will provide better information for adjusting his insulin as well as preventing extremes of blood sugars, currently has only minimal symptoms when he has hypoglycemia overnight  HYPERTENSION: Blood pressure is high and will then  offered from increasing lisinopril up to 10 mg  Lipids: to be checked on the next visit  Follow-up in 6 weeks for reassessment    Patient Instructions  May need 1 more click at meals and also with oatmeal  Counseling time on subjects discussed above is over 50% of today's 25 minute visit   Marlow Hendrie 10/11/2016, 1:00 PM

## 2016-10-12 ENCOUNTER — Encounter: Payer: Self-pay | Admitting: Endocrinology

## 2016-10-17 ENCOUNTER — Encounter: Payer: Self-pay | Admitting: Endocrinology

## 2016-10-19 ENCOUNTER — Other Ambulatory Visit: Payer: Self-pay | Admitting: Endocrinology

## 2016-10-19 MED ORDER — V-GO 20 KIT: PACK | 3 refills | Status: DC

## 2016-10-22 ENCOUNTER — Other Ambulatory Visit: Payer: Self-pay

## 2016-10-22 ENCOUNTER — Encounter: Payer: Self-pay | Admitting: Endocrinology

## 2016-10-22 MED ORDER — V-GO 20 KIT: PACK | 3 refills | Status: DC

## 2016-10-25 ENCOUNTER — Other Ambulatory Visit: Payer: Self-pay

## 2016-10-25 ENCOUNTER — Encounter: Payer: Self-pay | Admitting: Endocrinology

## 2016-10-25 MED ORDER — GLUCOSE BLOOD VI STRP: ORAL_STRIP | 3 refills | Status: DC

## 2016-12-04 ENCOUNTER — Encounter: Payer: Self-pay | Admitting: Endocrinology

## 2016-12-04 ENCOUNTER — Ambulatory Visit (INDEPENDENT_AMBULATORY_CARE_PROVIDER_SITE_OTHER): Payer: PPO | Admitting: Endocrinology

## 2016-12-04 VITALS — BP 126/86 | HR 77 | Ht 70.0 in | Wt 174.8 lb

## 2016-12-04 DIAGNOSIS — I10 Essential (primary) hypertension: Secondary | ICD-10-CM

## 2016-12-04 DIAGNOSIS — Z794 Long term (current) use of insulin: Secondary | ICD-10-CM

## 2016-12-04 DIAGNOSIS — E1165 Type 2 diabetes mellitus with hyperglycemia: Secondary | ICD-10-CM | POA: Diagnosis not present

## 2016-12-04 NOTE — Progress Notes (Signed)
Patient ID: Manuel Petty, male   DOB: 11-07-1945, 71 y.o.   MRN: [id number]   Reason for Appointment: follow-up diabetes  History of Present Illness   Diagnosis: Type 2 DIABETES MELITUS, date of diagnosis: 2007   Previous history: He had been on large doses of Lantus before he was given Victoza in addition and this improved his blood sugar control and reduced his insulin requirement However he subsequently started requiring mealtime coverage with large meals also  RECENT history:  Insulin regimen: [employer] brand Regular Insulin  V-go 20 units basal, mealtime boluses breakfast 4-8 units, lunch and supper 6-8 units  His A1c was relatively higher at 7.6% when he was switched to the V-go pump and with this his A1c has been between 6.4-6.7 Last A1c in 5/17  Current blood sugar patterns and problems identified:  He is here for short-term follow-up since he was switched from the 30 unit down to the 20 unit basal pump  With this he has not had any overnight hypoglycemia as before although the last couple of days he ran out of the 20 unit pump and with 30 units pump in his sugars were getting low during the night  OVERNIGHT blood sugars are at the lowest 106 now  He is not using the freestyle LIBRE sensor anything that this is relatively accurate compared to the fingersticks  No recent labs available to correlate his fingersticks sensor to the lab glucose  His blood sugars are overall improved since he has better monitoring of his postprandial readings and adjustment of mealtime boluses  Also has started exercising at the gym more regularly  He does not think his exercise regimen drops his blood sugar down too low  His weight is down 4 pounds  POSTPRANDIAL readings are variable but fairly well controlled, highest blood sugars are usually if he has a large breakfast especially with a larger fat content  He will also cover snacks with boluses  He does enter  carbohydrates in the sensor reader and is able  to monitor his carbohydrate intake with a reference book: Daily carbohydrate intake ranges between 77-245 grams   Non-insulin hypoglycemic drugs: Metformin 1 g twice a day      Side effects from medications: None  Proper timing of medications in relation to meals: Yes.          Monitors blood glucose:  3-4 times a day.    Glucometer:  FreeStyle           Blood Glucose readings:    Mean values apply above for all meters except median for One Touch  PRE-MEAL Fasting Lunch Dinner Bedtime Overall  Glucose range:       Mean/median: 121    114   135  127   POST-MEAL PC Breakfast PC Lunch PC Dinner  Glucose range:     Mean/median: 157   144      Mean values apply above for all meters except median for One Touch  PRE-MEAL Fasting Lunch Dinner Bedtime Overall  Glucose range: 74-180    46-2 53    Mean/median: 122  136  133  149  130          Meals:2- 3 meals per day. Lunch 2-3 pm; dinner 6 pm.  For breakfast he will either eat oatmeal or eggs, Bedtime snack, sometimes has ice cream, cereal         Exercise:  walking  Dietician visit: Most recent: 10/2009  Complications: are: None     Wt Readings from Last 3 Encounters:  12/04/16 174 lb 12.8 oz (79.3 kg)  10/11/16 178 lb 6.4 oz (80.9 kg)  08/22/16 177 lb (80.3 kg)      Lab Results  Component Value Date   HGBA1C 6.7 (H) 09/25/2016   HGBA1C 6.7 (H) 05/15/2016   HGBA1C 6.4 02/14/2016   Lab Results  Component Value Date   MICROALBUR 10.5 (H) 09/25/2016   LDLCALC 65 10/11/2015   CREATININE 0.82 09/25/2016    Other active problems discussed in review of systems   Allergies as of 12/04/2016   No Known Allergies     Medication List       Accurate as of 12/04/16  8:56 PM. Always use your most recent med list.          accu-chek softclix lancets Use as instructed   aspirin 81 MG tablet Take 81 mg by mouth daily.   B-D ULTRAFINE III SHORT PEN 31G X 8 MM  Misc Generic drug:  Insulin Pen Needle Use 4 times daily as  directed   FREESTYLE LIBRE READER Devi 1 Device by Does not apply route as directed.   [place] Misc Apply to upper arm and change sensor every 10 days   FREESTYLE LITE Devi Use to check blood sugar 5 times per day.   glucose blood test strip Commonly known as:  FREESTYLE LITE Use to check blood sugar 5 times per day   glucose blood test strip Commonly known as:  FREESTYLE PRECISION NEO TEST Use to check blood sugars up to 4 times daily.   insulin glargine 100 UNIT/ML injection Commonly known as:  LANTUS Inject 15 Units into the skin once.   insulin regular 100 units/mL injection Commonly known as:  NOVOLIN R Inject 0.68 mLs (68 Units total) into the skin daily.   lisinopril 10 MG tablet Commonly known as:  PRINIVIL,ZESTRIL Take 1 tablet (10 mg total) by mouth daily.   metFORMIN 1000 MG tablet Commonly known as:  GLUCOPHAGE Take 1 tablet by mouth  twice a day with meals   V-GO 20 Kit Use for insulin delivery and change every 24 hours       Allergies: No Known Allergies  Past Medical History:  Diagnosis Date  . Diabetes mellitus ([place]) 2,000  . Erectile dysfunction   . HTN (hypertension)   . Hyperlipidemia   . Personal history of colonic adenomas 07/18/2009  . Renal cancer ([place])    1992, has 1 kidney ; does not see urology any more    Past Surgical History:  Procedure Laterality Date  . COLONOSCOPY    . NEPHRECTOMY  1992    Family History  Problem Relation Age of Onset  . Diabetes Father   . Hypertension Father   . CAD Father        F (MI age 25), Brother in [place] 74  . Prostate cancer Brother        dx age 44  . Colon cancer Neg Hx   . Stroke Neg Hx     Social History:  reports that he has quit smoking. He has never used smokeless tobacco. He reports that he drinks about 1.2 oz of alcohol per week . He reports that he does not use drugs.  Review of Systems -      HYPERLIPIDEMIA:   He is only on Slo-Niacin 500 mg twice a day with adequate control of LDL and triglycerides  HDL  has been in the 20s previously along with small LDL particles  Particle number was below 1000 in 06/2014 but still has increased small particles  Has not had a follow-up Level   Lab Results  Component Value Date   CHOL 120 10/11/2015   HDL 24.40 (L) 10/11/2015   LDLCALC 65 10/11/2015   LDLDIRECT 45.7 05/31/2009   TRIG 151.0 (H) 10/11/2015   CHOLHDL 5 10/11/2015    He has mild hypertension  and is Controlled with 10 mg lisinopril  He monitors BP at home, and blood pressure readings are mostly near normal No recurrence of hyperkalemia  BP Readings from Last 3 Encounters:  12/04/16 126/86  10/11/16 (!) 151/83  08/22/16 120/80     Eye exams: Annual 8/17   Diabetic foot exam was done in 06/2015   Examination:   BP 126/86   Pulse 77   Ht 5' 10" (1.778 m)   Wt 174 lb 12.8 oz (79.3 kg)   SpO2 98%   BMI 25.08 kg/m   Body mass index is 25.08 kg/m.     Assesment/Plan:   Diabetes type 2, nonobese: See history of present illness for detailed discussion of his current blood sugar patterns, problems identified   His  blood sugars are better regulated now with the lower basal rate, was getting hypoglycemia when using the 30 unit basal He does have some variability and postprandial readings recently after breakfast with variable carbohydrate and fat intake Due to the management was discussed in detail He is benefiting from the use of the freestyle [place] sensor with more objective data, reminded him to periodically check the accuracy with the fingersticks  He is also benefiting from starting exercise Also has been able to get his blood sugar controlled well and weight down even without taking Victoza which was too expensive  HYPERTENSION: Blood pressure is  better with 10 mg lisinopril  Lipids: to be checked on the next visit  Follow-up in 2 months and  repeat A1c    There are no Patient Instructions on file for this visit.   KUMAR,AJAY 12/04/2016, 8:56 PM

## 2017-01-29 ENCOUNTER — Other Ambulatory Visit: Payer: Self-pay | Admitting: Endocrinology

## 2017-01-29 ENCOUNTER — Other Ambulatory Visit: Payer: Self-pay

## 2017-01-29 ENCOUNTER — Encounter: Payer: Self-pay | Admitting: Endocrinology

## 2017-01-29 MED ORDER — METFORMIN HCL 1000 MG PO TABS: ORAL_TABLET | ORAL | 1 refills | Status: DC

## 2017-01-29 MED ORDER — GLUCOSE BLOOD VI STRP: ORAL_STRIP | 3 refills | Status: DC

## 2017-01-31 ENCOUNTER — Other Ambulatory Visit (INDEPENDENT_AMBULATORY_CARE_PROVIDER_SITE_OTHER): Payer: PPO

## 2017-01-31 DIAGNOSIS — E1165 Type 2 diabetes mellitus with hyperglycemia: Secondary | ICD-10-CM

## 2017-01-31 DIAGNOSIS — Z794 Long term (current) use of insulin: Secondary | ICD-10-CM | POA: Diagnosis not present

## 2017-01-31 LAB — HEMOGLOBIN A1C: Hgb A1c MFr Bld: 6.3 % (ref 4.6–6.5)

## 2017-01-31 LAB — BASIC METABOLIC PANEL
BUN: 14 mg/dL (ref 6–23)
CALCIUM: 9 mg/dL (ref 8.4–10.5)
CO2: 28 mEq/L (ref 19–32)
Chloride: 105 mEq/L (ref 96–112)
Creatinine, Ser: 0.84 mg/dL (ref 0.40–1.50)
GFR: 96.32 mL/min (ref >60.00)
Glucose, Bld: 141 mg/dL — ABNORMAL HIGH (ref 70–99)
POTASSIUM: 4.7 meq/L (ref 3.5–5.1)
SODIUM: 137 meq/L (ref 135–145)

## 2017-01-31 LAB — LIPID PANEL
CHOLESTEROL: 102 mg/dL (ref 0–200)
HDL: 27.9 mg/dL — AB (ref >39.00)
LDL Cholesterol: 54 mg/dL (ref 0–99)
NONHDL: 73.96
TRIGLYCERIDES: 102 mg/dL (ref 0.0–149.0)
Total CHOL/HDL Ratio: 4
VLDL: 20.4 mg/dL (ref 0.0–40.0)

## 2017-02-05 ENCOUNTER — Ambulatory Visit (INDEPENDENT_AMBULATORY_CARE_PROVIDER_SITE_OTHER): Payer: PPO | Admitting: Endocrinology

## 2017-02-05 ENCOUNTER — Encounter: Payer: Self-pay | Admitting: Endocrinology

## 2017-02-05 VITALS — BP 128/76 | HR 77 | Ht 70.0 in | Wt 172.4 lb

## 2017-02-05 DIAGNOSIS — Z23 Encounter for immunization: Secondary | ICD-10-CM

## 2017-02-05 DIAGNOSIS — E1165 Type 2 diabetes mellitus with hyperglycemia: Secondary | ICD-10-CM

## 2017-02-05 DIAGNOSIS — I10 Essential (primary) hypertension: Secondary | ICD-10-CM

## 2017-02-05 DIAGNOSIS — E782 Mixed hyperlipidemia: Secondary | ICD-10-CM

## 2017-02-05 DIAGNOSIS — Z794 Long term (current) use of insulin: Secondary | ICD-10-CM

## 2017-02-05 NOTE — Progress Notes (Signed)
Patient ID: Manuel Petty, male   DOB: 08/11/1945, 71 y.o.   MRN: [id number]   Reason for Appointment: follow-up diabetes  History of Present Illness   Diagnosis: Type 2 DIABETES MELITUS, date of diagnosis: 2007   Previous history: He had been on large doses of Lantus before he was given Victoza in addition and this improved his blood sugar control and reduced his insulin requirement However he subsequently started requiring mealtime coverage with large meals also  RECENT history:  Insulin regimen: [employer] brand Regular Insulin  V-go 20 units basal, mealtime boluses breakfast 4-8 units, lunch and supper 6-8 units  His A1c was relatively higher at 7.6% when he was switched to the V-go pump and with this his A1c has been between 6.4-6.7  A1c is somewhat better at 6.3 compared to 6.7  Current blood sugar patterns and problems identified:  He has apparently changed his diet along with his wife and cutting back on snacking, junk food and high glycemic carbohydrates like pasta  Although he has not lost much weight he is overall getting lower blood sugars  His blood sugars are getting low overnight especially after midnight and also sometimes before suppertime  His is that previously would eat ice cream at bedtime and he is not doing this  He also has been more active with remodeling his kitchen although not doing any formal exercise as before  Even with his change in diet he has not reduced his BOLUSES  lois postprandial readings are after supper with average only 120  He may have high readings sometimes after lunch depending on what he is eating and not making adjustments for portions or carbohydrate  Otherwise his blood sugars are not fluctuating much the rest of the time   Non-insulin hypoglycemic drugs: Metformin 1 g twice a day      Side effects from medications: None  Proper timing of medications in relation to meals: Yes.          Monitors blood  glucose:  3-4 times a day.    Glucometer:  FreeStyle Libre           Blood Glucose averages:     PRE-MEAL Fasting Lunch Dinner Bedtime Overall  Glucose range:       Mean/median: 107   105  111  113   POST-MEAL PC Breakfast PC Lunch PC Dinner  Glucose range:     Mean/median: 128  152  120            Meals:2- 3 meals per day. Lunch 2-3 pm; dinner 6 pm.  For breakfast he will either eat oatmeal or eggs     Dietician visit: Most recent: 05/8561           Complications: are: None     Wt Readings from Last 3 Encounters:  02/05/17 172 lb 6.4 oz (78.2 kg)  12/04/16 174 lb 12.8 oz (79.3 kg)  10/11/16 178 lb 6.4 oz (80.9 kg)      Lab Results  Component Value Date   HGBA1C 6.3 01/31/2017   HGBA1C 6.7 (H) 09/25/2016   HGBA1C 6.7 (H) 05/15/2016   Lab Results  Component Value Date   MICROALBUR 10.5 (H) 09/25/2016   LDLCALC 54 01/31/2017   CREATININE 0.84 01/31/2017    Other active problems discussed in review of systems   Allergies as of 02/05/2017   No Known Allergies     Medication List       Accurate as of 02/05/17  1:37 PM. Always use your most recent med list.          accu-chek softclix lancets Use as instructed   aspirin 81 MG tablet Take 81 mg by mouth daily.   FREESTYLE LIBRE READER Devi 1 Device by Does not apply route as directed.   FREESTYLE LIBRE SENSOR SYSTEM Misc APPLY TO UPPER ARM AND CHANGE SENSOR EVERY 10 DAYS   FREESTYLE LITE Devi Use to check blood sugar 5 times per day.   glucose blood test strip Commonly known as:  FREESTYLE LITE Use to check blood sugar 5 times per day   glucose blood test strip Commonly known as:  FREESTYLE PRECISION NEO TEST Use to check blood sugars up to 4 times daily.   insulin regular 100 units/mL injection Commonly known as:  NOVOLIN R Inject 0.68 mLs (68 Units total) into the skin daily.   lisinopril 10 MG tablet Commonly known as:  PRINIVIL,ZESTRIL Take 1 tablet (10 mg total) by mouth daily.     metFORMIN 1000 MG tablet Commonly known as:  GLUCOPHAGE Take 1 tablet by mouth  twice a day with meals   V-GO 20 Kit Use for insulin delivery and change every 24 hours            Discharge Care Instructions        Start     Ordered   02/05/17 0000  Flu vaccine HIGH DOSE PF     02/05/17 1311      Allergies: No Known Allergies  Past Medical History:  Diagnosis Date  . Diabetes mellitus ([place]) 2,000  . Erectile dysfunction   . HTN (hypertension)   . Hyperlipidemia   . Personal history of colonic adenomas 07/18/2009  . Renal cancer ([place])    1992, has 1 kidney ; does not see urology any more    Past Surgical History:  Procedure Laterality Date  . COLONOSCOPY    . NEPHRECTOMY  1992    Family History  Problem Relation Age of Onset  . Diabetes Father   . Hypertension Father   . CAD Father        F (MI age 7), Brother in [place] 69  . Prostate cancer Brother        dx age 49  . Colon cancer Neg Hx   . Stroke Neg Hx     Social History:  reports that he has quit smoking. He has never used smokeless tobacco. He reports that he drinks about 1.2 oz of alcohol per week . He reports that he does not use drugs.  Review of Systems -     HYPERLIPIDEMIA:   He is only on Slo-Niacin 500 mg twice a day with adequate control of LDL and triglycerides  HDL has been in the 20s previously along with small LDL particles  Particle number was below 1000 in 06/2014 but still has increased small particles  HDL is improved now  Lab Results  Component Value Date   CHOL 102 01/31/2017   HDL 27.90 (L) 01/31/2017   LDLCALC 54 01/31/2017   LDLDIRECT 45.7 05/31/2009   TRIG 102.0 01/31/2017   CHOLHDL 4 01/31/2017    He has mild hypertension  and is Controlled with 10 mg lisinopril  He monitors BP at home, and blood pressure readings are 130/70s Sometimes will have a high blood pressure when he is first checked   No recurrence of hyperkalemia  BP Readings from Last 3 Encounters:   02/05/17 (!) 142/86  12/04/16 126/86  10/11/16 (!) 151/83     Eye exams: Annual 8/17   Diabetic foot exam was done in 06/2015   Examination:   BP (!) 142/86   Pulse 77   Ht 5' 10"  (1.778 m)   Wt 172 lb 6.4 oz (78.2 kg)   SpO2 98%   BMI 24.74 kg/m   Body mass index is 24.74 kg/m.     Assesment/Plan:   Diabetes type 2, nonobese: See history of present illness for detailed discussion of his current blood sugar patterns, problems identified   His  blood sugars are Fairly well controlled Recently with changing his diet his blood sugars are relatively lower including tendency to hypoglycemia With his taking regular insulin instead of analog insulin he tends to have somewhat delayed hypoglycemia late at night and sometimes before suppertime Even with changing his diet and avoiding bedtime snacks he has not adjusted his boluses He is also more active now Discussed day-to-day management of his insulin pump, boluses, types of insulin, avoiding hypoglycemia, blood sugar targets   Most likely he can take 2 units unless to cover his evening meal unless eating a larger meal  He may need to switch to analog insulin in January when he has improved coverage For now to avoid overnight hypoglycemia needs to have a bedtime snack, but not a large portion He can reduce his evening metformin to 500 mg for now Adjust boluses at lunchtime to reflect his intake including an extra click for eating out   HYPERTENSION: Blood pressure is  controlled with10 mg lisinopril Continue monitoring at home also  High-dose influenza vaccine given   Patient Instructions  3 clicks at dinner unless eating more carbs; may snack at bedtime if sugar <361  Extra click at lunch if larger meal or fat  Reduce pm Metformin to 1/2    Counseling time on subjects discussed in assessment and plan sections is over 50% of today's 25 minute visit   Zayneb Baucum 02/05/2017, 1:37 PM

## 2017-02-05 NOTE — Patient Instructions (Addendum)
3 clicks at dinner unless eating more carbs; may snack at bedtime if sugar <233  Extra click at lunch if larger meal or fat  Reduce pm Metformin to 1/2

## 2017-03-11 ENCOUNTER — Encounter: Payer: Self-pay | Admitting: Endocrinology

## 2017-03-12 ENCOUNTER — Other Ambulatory Visit: Payer: Self-pay

## 2017-03-12 MED ORDER — V-GO 20 KIT: PACK | 3 refills | Status: DC

## 2017-03-29 DIAGNOSIS — E119 Type 2 diabetes mellitus without complications: Secondary | ICD-10-CM | POA: Diagnosis not present

## 2017-03-29 LAB — HM DIABETES EYE EXAM

## 2017-04-16 ENCOUNTER — Ambulatory Visit: Payer: PPO | Admitting: Family Medicine

## 2017-04-16 ENCOUNTER — Encounter: Payer: Self-pay | Admitting: Family Medicine

## 2017-04-16 DIAGNOSIS — M25511 Pain in right shoulder: Secondary | ICD-10-CM

## 2017-04-16 NOTE — Patient Instructions (Signed)
You have strained your infraspinatus and less so your supraspinatus Try to avoid painful activities (overhead activities, lifting with extended arm) as much as possible. Tylenol 500mg  1-2 tabs three times a day as needed for pain. Subacromial injection may be beneficial to help with pain and to decrease inflammation. Consider physical therapy with transition to home exercise program. Do home exercise program with theraband and scapular stabilization exercises daily 3 sets of 10 once a day. If not improving at follow-up we will consider imaging, injection, physical therapy, and/or nitro patches. Follow up with me in 1 month.

## 2017-04-17 ENCOUNTER — Encounter: Payer: Self-pay | Admitting: Endocrinology

## 2017-04-17 ENCOUNTER — Encounter: Payer: Self-pay | Admitting: Family Medicine

## 2017-04-17 DIAGNOSIS — M25511 Pain in right shoulder: Secondary | ICD-10-CM | POA: Insufficient documentation

## 2017-04-17 NOTE — Assessment & Plan Note (Signed)
secondary to rotator cuff strain primarily of infraspinatus moreso than supraspinatus.  Tylenol if needed.  Shown home exercises to do daily.  Consider injection, imaging, PT, nitro patches if not improving as expected.  F/u in 1 month.

## 2017-04-17 NOTE — Progress Notes (Signed)
PCP: Colon Branch, MD  Subjective:   HPI: Patient is a 71 y.o. male here for right shoulder pain.  Patient reports he's had about 1 week of lateral right shoulder pain. No acute injury but felt this start after moving an old 85 - felt pain the next day. Pain level 0/10 at rest but up to 6/10 and sharp. Worse with reaching and overhead motions. Right handed. Will radiate down the arm. No skin changes, numbness.  Past Medical History:  Diagnosis Date  . Diabetes mellitus ([place]) 2,000  . Erectile dysfunction   . HTN (hypertension)   . Hyperlipidemia   . Personal history of colonic adenomas 07/18/2009  . Renal cancer ([place])    1992, has 1 kidney ; does not see urology any more    Current Outpatient Medications on File Prior to Visit  Medication Sig Dispense Refill  . aspirin 81 MG tablet Take 81 mg by mouth daily.    . Blood Glucose Monitoring Suppl (FREESTYLE LITE) DEVI Use to check blood sugar 5 times per day. (Patient not taking: Reported on 02/05/2017) 500 each 2  . Continuous Blood Gluc Receiver (FREESTYLE LIBRE READER) DEVI 1 Device by Does not apply route as directed. 1 Device 0  . Continuous Blood Gluc Sensor (FREESTYLE LIBRE SENSOR SYSTEM) MISC APPLY TO UPPER ARM AND CHANGE SENSOR EVERY 10 DAYS 3 each 3  . glucose blood (FREESTYLE LITE) test strip Use to check blood sugar 5 times per day (Patient not taking: Reported on 02/05/2017) 500 each 2  . glucose blood (FREESTYLE PRECISION NEO TEST) test strip Use to check blood sugars up to 4 times daily. 100 each 3  . Insulin Disposable Pump (V-GO 20) KIT Use for insulin delivery and change every 24 hours 1 kit 3  . insulin regular (NOVOLIN R) 100 units/mL injection Inject 0.68 mLs (68 Units total) into the skin daily. 70 mL 1  . Lancet Devices (ACCU-CHEK SOFTCLIX) lancets Use as instructed 100 each 1  . lisinopril (PRINIVIL,ZESTRIL) 10 MG tablet Take 1 tablet (10 mg total) by mouth daily. 90 tablet 3  . metFORMIN (GLUCOPHAGE)  1000 MG tablet Take 1 tablet by mouth  twice a day with meals 180 tablet 1   No current facility-administered medications on file prior to visit.     Past Surgical History:  Procedure Laterality Date  . COLONOSCOPY    . NEPHRECTOMY  1992    No Known Allergies  Social History   Socioeconomic History  . Marital status: Married    Spouse name: Not on file  . Number of children: 1  . Years of education: Not on file  . Highest education level: Not on file  Social Needs  . Financial resource strain: Not on file  . Food insecurity - worry: Not on file  . Food insecurity - inability: Not on file  . Transportation needs - medical: Not on file  . Transportation needs - non-medical: Not on file  Occupational History  . Occupation: retired   Tobacco Use  . Smoking status: Former Research scientist (life sciences)  . Smokeless tobacco: Never Used  . Tobacco comment: quit in 1992, 2 ppd  Substance and Sexual Activity  . Alcohol use: Yes    Alcohol/week: 1.2 oz    Types: 2 Cans of beer per week  . Drug use: No    Comment: used to use marihuana    . Sexual activity: Not on file  Other Topics Concern  . Not on file  Social  History Narrative   Lives w/ wife    Family History  Problem Relation Age of Onset  . Diabetes Father   . Hypertension Father   . CAD Father        F (MI age 76), Brother in [place] 25  . Prostate cancer Brother        dx age 37  . Colon cancer Neg Hx   . Stroke Neg Hx     BP (!) 151/77   Pulse 70   Ht _0  (1.753 m)   Wt 170 lb (77.1 kg)   BMI 25.10 kg/m   Review of Systems: See HPI above.     Objective:  Physical Exam:  Gen: NAD, comfortable in exam room  Right shoulder: No swelling, ecchymoses.  No gross deformity. No TTP. FROM with mild painful arc. Negative [place], Neers. Negative Yergasons. Strength 5/5 with empty can and resisted internal/external rotation.  Pain ER. Negative apprehension. NV intact distally.  Left shoulder: No swelling, ecchymoses.  No  gross deformity. No TTP. FROM. Strength 5/5 with empty can and resisted internal/external rotation. NV intact distally.   Assessment & Plan:  1. Right shoulder pain - secondary to rotator cuff strain primarily of infraspinatus moreso than supraspinatus.  Tylenol if needed.  Shown home exercises to do daily.  Consider injection, imaging, PT, nitro patches if not improving as expected.  F/u in 1 month.

## 2017-04-30 ENCOUNTER — Other Ambulatory Visit (INDEPENDENT_AMBULATORY_CARE_PROVIDER_SITE_OTHER): Payer: PPO

## 2017-04-30 DIAGNOSIS — E1165 Type 2 diabetes mellitus with hyperglycemia: Secondary | ICD-10-CM

## 2017-04-30 DIAGNOSIS — Z794 Long term (current) use of insulin: Secondary | ICD-10-CM

## 2017-04-30 LAB — BASIC METABOLIC PANEL
BUN: 16 mg/dL (ref 6–23)
CHLORIDE: 108 meq/L (ref 96–112)
CO2: 25 mEq/L (ref 19–32)
CREATININE: 0.82 mg/dL (ref 0.40–1.50)
Calcium: 8.7 mg/dL (ref 8.4–10.5)
GFR: 98.96 mL/min (ref >60.00)
Glucose, Bld: 130 mg/dL — ABNORMAL HIGH (ref 70–99)
Potassium: 4.5 mEq/L (ref 3.5–5.1)
Sodium: 138 mEq/L (ref 135–145)

## 2017-04-30 LAB — HEMOGLOBIN A1C: HEMOGLOBIN A1C: 6.5 % (ref 4.6–6.5)

## 2017-05-16 ENCOUNTER — Encounter: Payer: Self-pay | Admitting: Endocrinology

## 2017-05-16 ENCOUNTER — Ambulatory Visit: Payer: PPO | Admitting: Endocrinology

## 2017-05-16 VITALS — BP 146/80 | HR 64 | Ht 69.0 in | Wt 176.0 lb

## 2017-05-16 DIAGNOSIS — E1165 Type 2 diabetes mellitus with hyperglycemia: Secondary | ICD-10-CM

## 2017-05-16 DIAGNOSIS — Z794 Long term (current) use of insulin: Secondary | ICD-10-CM | POA: Diagnosis not present

## 2017-05-16 MED ORDER — FREESTYLE LIBRE 14 DAY SENSOR MISC: 1.0000 | 5 refills | Status: DC

## 2017-05-16 NOTE — Progress Notes (Signed)
Patient ID: Manuel Petty, male   DOB: [birth year], 72 y.o.   MRN: [id number]   Reason for Appointment: follow-up diabetes  History of Present Illness   Diagnosis: Type 2 DIABETES MELITUS, date of diagnosis: 2007   Previous history: He had been on large doses of Lantus before he was given Victoza in addition and this improved his blood sugar control and reduced his insulin requirement However he subsequently started requiring mealtime coverage with large meals also  RECENT history:  Insulin regimen: [employer] brand Regular Insulin  V-go 20 units basal, mealtime boluses breakfast 4-8 units, lunch and supper 6-8 units   A1c is about the same at 6.5, previously range 13-7.6  Current blood sugar patterns and problems identified:  He has low a tendency to HYPOGLYCEMIA overnight particularly around 3-4 AM especially in the last 7-10 days  This is despite his blood sugars for midnight averaging about 150; however occasionally may take a bolus if his blood sugars are around 200 at bedtime  Lowest blood sugars overnight = 40 on 12/29  Not as active recently and has gained weight  Also diagnosed have been consistent in December  With the V-go pump however he is having fairly stable blood sugars throughout the day with average readings only significantly high AFTER LUNCH  He is however sometimes forgetting to BOLUS before eating at lunchtime  On his own he has cut back his metformin to once a day in the morning only instead of twice a day  Postprandial readings after breakfast and evening meal are on an average fairly good and only occasionally above 180 at night  FASTING readings are usually fairly good also    Non-insulin hypoglycemic drugs: Metformin 1 g 1x a day      Side effects from medications: None  Proper timing of medications in relation to meals: Yes.          Monitors blood glucose:  3-4 times a day.    Glucometer:  FreeStyle Libre           Blood  Glucose averages at various times:   OVERNIGHT average lowest 105 from 2-4 AM  PRE-MEAL Fasting Lunch Dinner Bedtime Overall  Glucose range:       Mean/median: 122   136  151  139+/-42    POST-MEAL PC Breakfast PC Lunch PC Dinner  Glucose range:     Mean/median: 130  174  160    His blood sugars are within range 79% of the time, 17% above range and 4% below 70   Meals:2- 3 meals per day. Lunch 2-3 pm; dinner 6 pm.  For breakfast he will usuallyatmeal or eggs     Dietician visit: Most recent: 06/7251           Complications: are: None     Wt Readings from Last 3 Encounters:  05/16/17 176 lb (79.8 kg)  04/16/17 170 lb (77.1 kg)  02/05/17 172 lb 6.4 oz (78.2 kg)      Lab Results  Component Value Date   HGBA1C 6.5 04/30/2017   HGBA1C 6.3 01/31/2017   HGBA1C 6.7 (H) 09/25/2016   Lab Results  Component Value Date   MICROALBUR 10.5 (H) 09/25/2016   LDLCALC 54 01/31/2017   CREATININE 0.82 04/30/2017    Other active problems discussed in review of systems   Allergies as of 05/16/2017   No Known Allergies     Medication List        Accurate as of  05/16/17 12:49 PM. Always use your most recent med list.          accu-chek softclix lancets Use as instructed   aspirin 81 MG tablet Take 81 mg by mouth daily.   FREESTYLE LIBRE 14 DAY SENSOR Misc 1 each by Does not apply route every 14 (fourteen) days.   FREESTYLE LIBRE READER Devi 1 Device by Does not apply route as directed.   FREESTYLE LITE Devi Use to check blood sugar 5 times per day.   glucose blood test strip Commonly known as:  FREESTYLE LITE Use to check blood sugar 5 times per day   glucose blood test strip Commonly known as:  FREESTYLE PRECISION NEO TEST Use to check blood sugars up to 4 times daily.   insulin regular 100 units/mL injection Commonly known as:  NOVOLIN R Inject 0.68 mLs (68 Units total) into the skin daily.   lisinopril 10 MG tablet Commonly known as:  PRINIVIL,ZESTRIL Take 1  tablet (10 mg total) by mouth daily.   metFORMIN 1000 MG tablet Commonly known as:  GLUCOPHAGE Take 1 tablet by mouth  twice a day with meals   V-GO 20 Kit Use for insulin delivery and change every 24 hours       Allergies: No Known Allergies  Past Medical History:  Diagnosis Date  . Diabetes mellitus ([place]) 2,000  . Erectile dysfunction   . HTN (hypertension)   . Hyperlipidemia   . Personal history of colonic adenomas 07/18/2009  . Renal cancer ([place])    1992, has 1 kidney ; does not see urology any more    Past Surgical History:  Procedure Laterality Date  . COLONOSCOPY    . NEPHRECTOMY  1992    Family History  Problem Relation Age of Onset  . Diabetes Father   . Hypertension Father   . CAD Father        F (MI age 90), Brother in [place] 34  . Prostate cancer Brother        dx age 37  . Colon cancer Neg Hx   . Stroke Neg Hx     Social History:  reports that he has quit smoking. he has never used smokeless tobacco. He reports that he drinks about 1.2 oz of alcohol per week. He reports that he does not use drugs.  Review of Systems -     HYPERLIPIDEMIA:   He is only on Slo-Niacin 500 mg twice a day with adequate control of LDL and triglycerides  HDL has been in the 20s previously along with small LDL particles  Particle number was below 1000 in 06/2014 but still has increased small particles  HDL is improved compared to previous levels  Lab Results  Component Value Date   CHOL 102 01/31/2017   HDL 27.90 (L) 01/31/2017   LDLCALC 54 01/31/2017   LDLDIRECT 45.7 05/31/2009   TRIG 102.0 01/31/2017   CHOLHDL 4 01/31/2017    He has mild hypertension  and is appearing Controlled with 10 mg lisinopril  He monitors BP at home, and blood pressure readings are 120-130/70s Sometimes will have a high blood pressure when first checked in the office   BP Readings from Last 3 Encounters:  05/16/17 (!) 146/80  04/16/17 (!) 151/77  02/05/17 128/76     Eye exams:  Annual 8/17   Diabetic foot exam was done in 1/19   Examination:   BP (!) 146/80   Pulse 64   Ht 5' 9" (1.753 m)  Wt 176 lb (79.8 kg)   BMI 25.99 kg/m   Body mass index is 25.99 kg/m.   Diabetic Foot Exam - Simple   Simple Foot Form Diabetic Foot exam was performed with the following findings:  Yes   Visual Inspection No deformities, no ulcerations, no other skin breakdown bilaterally:  Yes Sensation Testing Intact to touch and monofilament testing bilaterally:  Yes Pulse Check Posterior Tibialis and Dorsalis pulse intact bilaterally:  Yes Comments      Assesment/Plan:   Diabetes type 2, nonobese: See history of present illness for detailed discussion of his current blood sugar patterns, problems identified   His  blood sugars are overall well controlled with A1c was below 7% However he is getting overnight hypoglycemia now despite using only a 20 unit pump and only one metformin a day Postprandial readings are higher after lunch from miscalculating the bolus or forgetting to bolus before eating Otherwise blood sugars are reasonably well-controlled throughout the day Discussed day-to-day management of his diabetes, self-care, diet and exercise and insulin administration  Recommendations:  Consider trial of the Omnipod pump and discussed in detail how this would be different than the V-go  Stop metformin to avoid overnight hypoglycemia  Bedtime snack  Needs to bolus before eating consistently before lunch  No correction boluses at night before bedtime  Regular exercise   HYPERTENSION: Blood pressure is  controlled with10 mg lisinopril, tends to have high office readings  Continue monitoring at home    Patient Instructions  Stop Metformin  No click at bedti me if sugar high  Counseling time on subjects discussed in assessment and plan sections is over 50% of today's 25 minute visit   Elayne Snare 05/16/2017, 12:49 PM

## 2017-05-16 NOTE — Patient Instructions (Signed)
Stop Metformin  No click at bedti me if sugar high

## 2017-05-21 ENCOUNTER — Ambulatory Visit: Payer: PPO | Admitting: Family Medicine

## 2017-05-23 ENCOUNTER — Other Ambulatory Visit: Payer: Self-pay | Admitting: Endocrinology

## 2017-05-23 ENCOUNTER — Encounter: Payer: Self-pay | Admitting: Endocrinology

## 2017-05-23 MED ORDER — FREESTYLE LIBRE 14 DAY READER DEVI: 1.0000 | Freq: Once | 0 refills | Status: AC

## 2017-05-29 ENCOUNTER — Other Ambulatory Visit: Payer: Self-pay | Admitting: Endocrinology

## 2017-07-08 IMAGING — US US AORTA SCREENING (MEDICARE)
1 series · 13 of 25 positions shown · non-contrast
Comparison: None.

CLINICAL DATA: History of hypertension, diabetes, renal cell
carcinoma with nephrectomy in 1994

EXAM:
ABDOMINAL AORTA SCREENING ULTRASOUND
TECHNIQUE: Ultrasound examination of the abdominal aorta was performed as a
screening evaluation for abdominal aortic aneurysm.

[Series 1: us aorta screening (medicare) · 0.20mm/px · 13 of 25 slices shown]
[im 1/25]
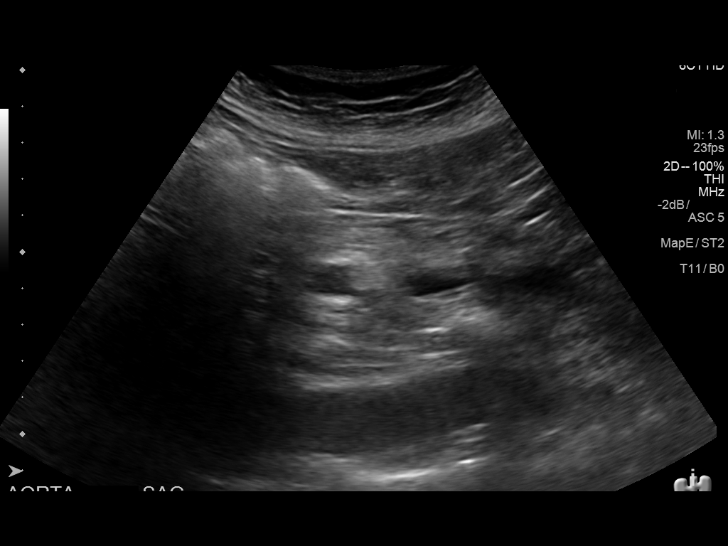
[im 3/25]
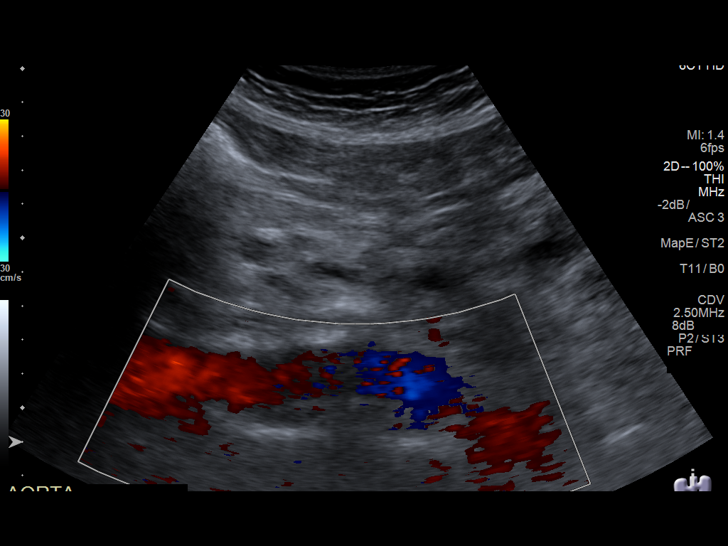
[im 5/25]
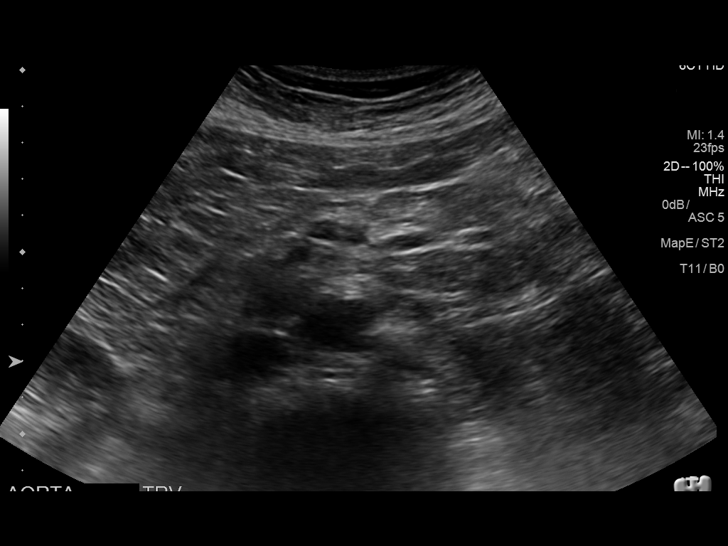
[im 7/25]
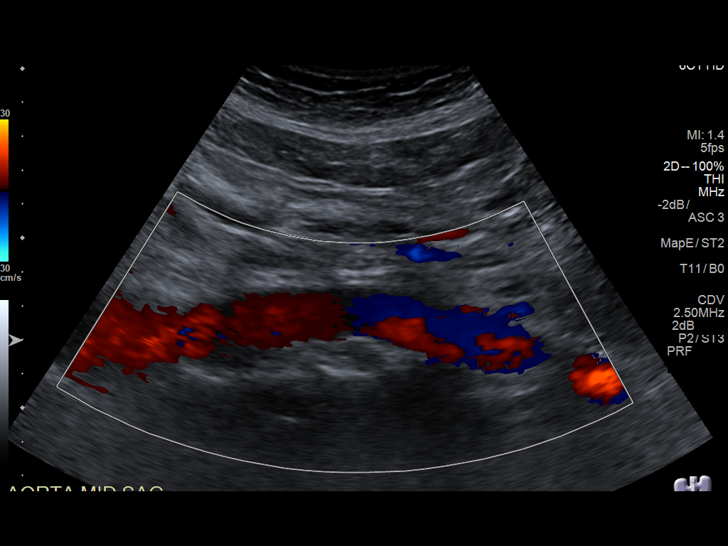
[im 9/25]
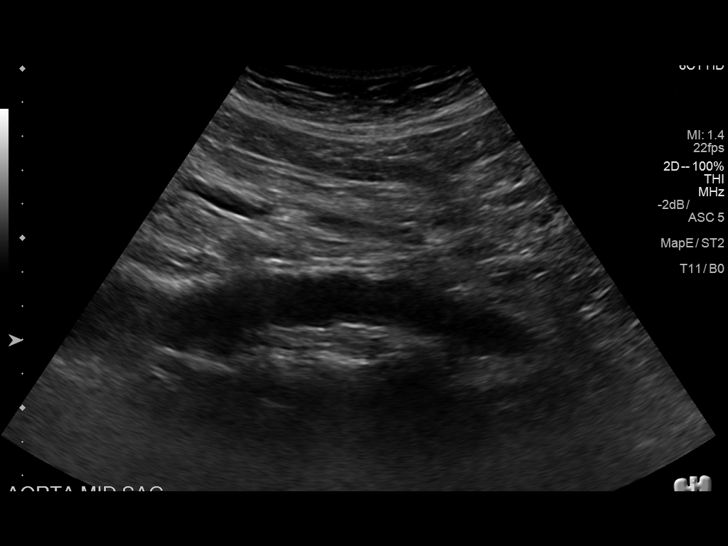
[im 11/25]
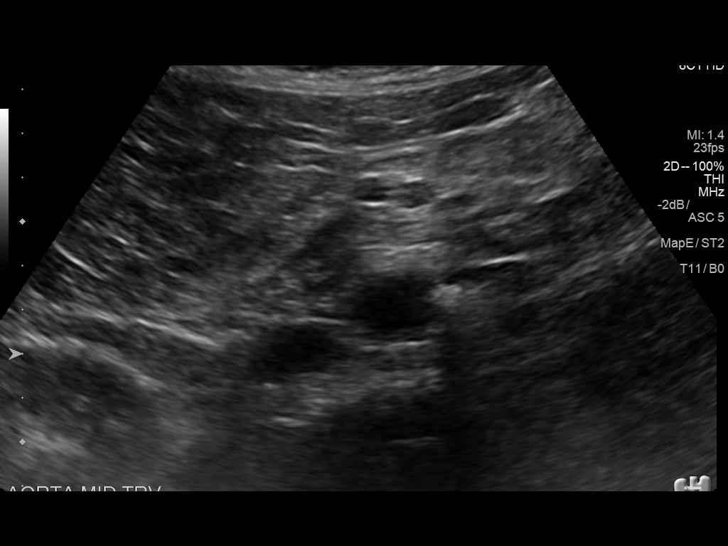
[im 13/25]
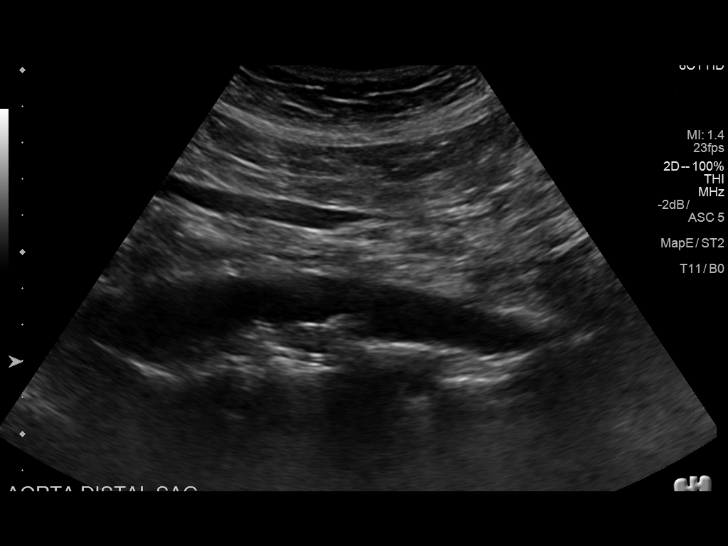
[im 15/25]
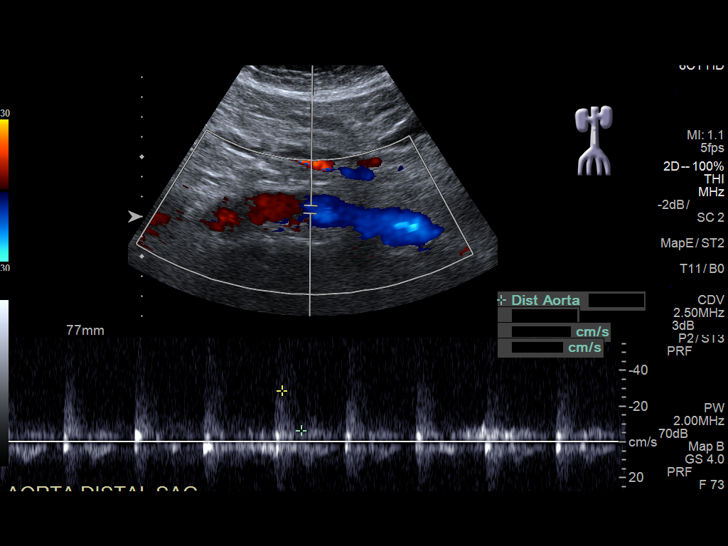
[im 17/25]
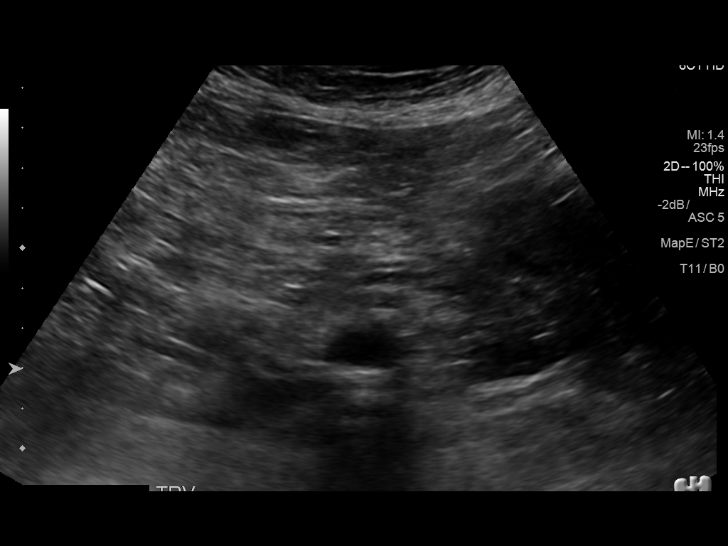
[im 19/25]
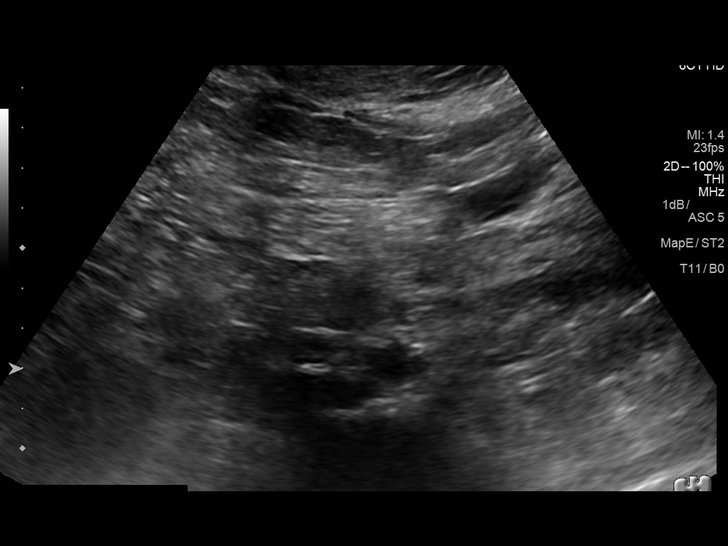
[im 21/25]
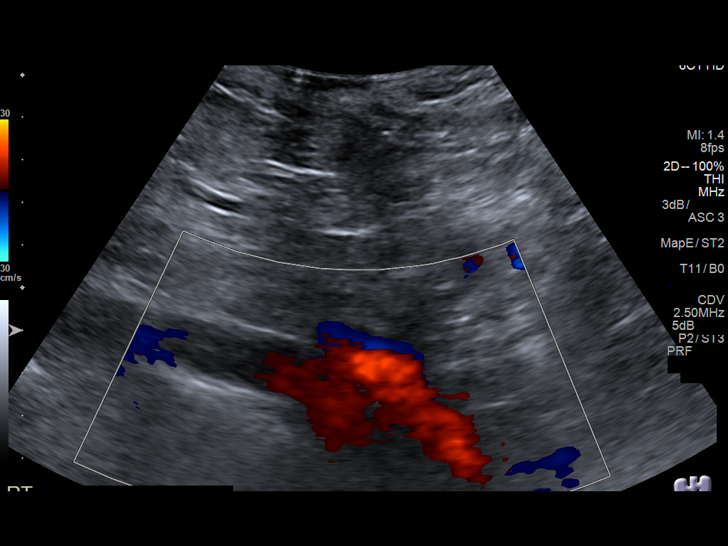
[im 23/25]
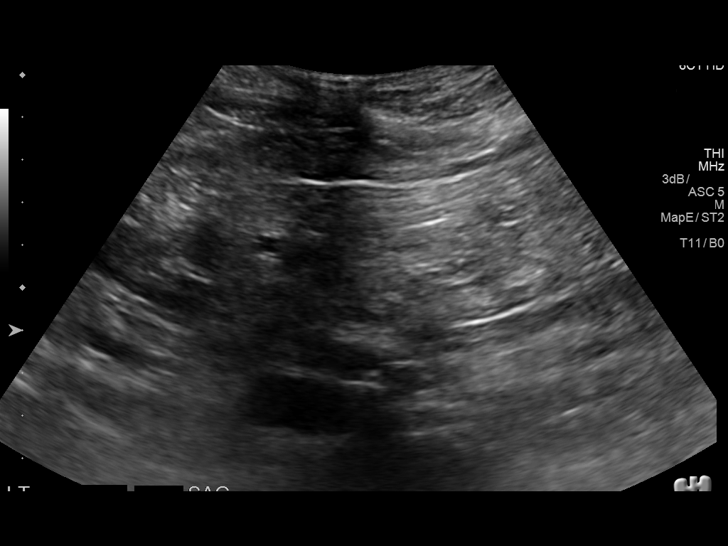
[im 25/25]
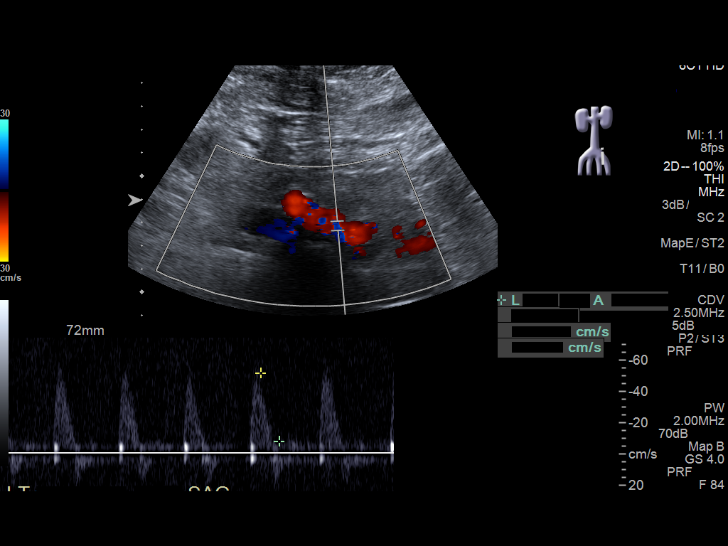

[13 of 25 positions shown; findings below may reference images not displayed]

FINDINGS: Abdominal Aorta

No abdominal aortic aneurysm is seen. The proximal abdominal aorta
measures 2.4 x 2.9 cm. The mid abdominal aorta measures 2.1 x
cm, with the distal abdominal aorta measuring 2.2 x 2.6 cm. Within
measurement of 2.6 cm distally, followup recommendations are given
below.

The proximal right common iliac artery measures 18 mm in diameter,
with the proximal left common iliac artery measuring 20 mm in
diameter

Maximum AP

Diameter:  2.4 cm proximally.

Maximum TRV

Diameter: 2.9 cm proximally.
IMPRESSION: No abdominal aortic aneurysm is seen. However with the distal
abdominal aorta measuring 2.6 cm in maximum diameter the following
recommendation is made. Ectatic abdominal aorta at risk for aneurysm
development. Recommend followup by ultrasound in 5 years. This
recommendation follows ACR consensus guidelines: White Paper of the
ACR Incidental Findings Committee II on Vascular Findings. [HOSPITAL] 6893; [DATE].

## 2017-07-09 ENCOUNTER — Encounter: Payer: Self-pay | Admitting: Endocrinology

## 2017-07-12 ENCOUNTER — Encounter: Payer: Self-pay | Admitting: Endocrinology

## 2017-07-15 MED ORDER — V-GO 20 KIT: PACK | 3 refills | Status: DC

## 2017-08-13 ENCOUNTER — Other Ambulatory Visit (INDEPENDENT_AMBULATORY_CARE_PROVIDER_SITE_OTHER): Payer: PPO

## 2017-08-13 DIAGNOSIS — E1165 Type 2 diabetes mellitus with hyperglycemia: Secondary | ICD-10-CM

## 2017-08-13 DIAGNOSIS — Z794 Long term (current) use of insulin: Secondary | ICD-10-CM | POA: Diagnosis not present

## 2017-08-13 LAB — BASIC METABOLIC PANEL
BUN: 19 mg/dL (ref 6–23)
CHLORIDE: 105 meq/L (ref 96–112)
CO2: 28 meq/L (ref 19–32)
Calcium: 9 mg/dL (ref 8.4–10.5)
Creatinine, Ser: 0.73 mg/dL (ref 0.40–1.50)
GFR: 113.08 mL/min (ref >60.00)
Glucose, Bld: 153 mg/dL — ABNORMAL HIGH (ref 70–99)
POTASSIUM: 5.1 meq/L (ref 3.5–5.1)
Sodium: 139 mEq/L (ref 135–145)

## 2017-08-13 LAB — HEMOGLOBIN A1C: Hgb A1c MFr Bld: 6.5 % (ref 4.6–6.5)

## 2017-08-19 NOTE — Progress Notes (Signed)
Patient ID: Manuel Petty, male   DOB: 22-May-1945, 72 y.o.   MRN: [id number]   Reason for Appointment: follow-up diabetes  History of Present Illness   Diagnosis: Type 2 DIABETES MELITUS, date of diagnosis: 2007   Previous history: He had been on large doses of Lantus before he was given Victoza in addition and this improved his blood sugar control and reduced his insulin requirement However he subsequently started requiring mealtime coverage with large meals also  RECENT history:  Insulin regimen: [employer] brand Regular Insulin  V-go 20 units basal, mealtime boluses breakfast 6uy  units, lunch and supper 6-8 units  A1c is the same at 6.5, previously range 13-7.6  Current blood sugar patterns and problems identified:  He was told to stop his metformin in January because of a tendency to HYPOGLYCEMIA overnight  With this he has had only one recent episode of low sugars at night  He is concerned that his blood sugars are higher late morning, periodically over 200  This is apparently soon after drinking his coffee and he will not bolus until he eats breakfast about 30 minutes later  Does not have any high sugar content or excessive carbohydrate at breakfast in the morning either  He was then tried to take extra insulin to bring his sugars down the early afternoon but resulting low sugars around 2 PM at times  OVERNIGHT blood sugars are usually stable but appear to be variable in the early part of the night  Other interpretation of the CGM indicates that blood sugars are trending higher now rebounding before dinnertime but usually not having unusual high readings after his evening meal around 6 PM All the fasting readings are somewhat variable but still averaging about 130+ Also may have tendency to hypoglycemia if he is more active in the middle of the day and not eating meal or lunch on time   Non-insulin hypoglycemic drugs: Metformin 1 g 1x a day      Side  effects from medications: None  Proper timing of medications in relation to meals: Yes.          Monitors blood glucose:  3-4 times a day.    Glucometer:  FreeStyle Libre           Blood Glucose averages at various times:    CGM use % of time  97  Average and SD  152+/-40  Time in range     77   %  % Time Above 180  22  % Time above 250   % Time Below target 1   Mean values apply above for all meters except median for One Touch  PRE-MEAL Fasting Lunch Dinner Bedtime Overall  Glucose range:       Mean/median:  133  168  165  162  152   POST-MEAL PC Breakfast PC Lunch PC Dinner  Glucose range:     Mean/median:  177  124  183      Meals:2- 3 meals per day. Lunch 2-3 pm; dinner 6 pm.  For breakfast he will usually have English muffin and eggs      Dietician visit: Most recent: 12/1186           Complications: are: None     Wt Readings from Last 3 Encounters:  08/20/17 176 lb (79.8 kg)  05/16/17 176 lb (79.8 kg)  04/16/17 170 lb (77.1 kg)      Lab Results  Component Value Date  HGBA1C 6.5 08/13/2017   HGBA1C 6.5 04/30/2017   HGBA1C 6.3 01/31/2017   Lab Results  Component Value Date   MICROALBUR 10.5 (H) 09/25/2016   LDLCALC 54 01/31/2017   CREATININE 0.73 08/13/2017    Other active problems discussed in review of systems   Allergies as of 08/20/2017   No Known Allergies     Medication List        Accurate as of 08/20/17  4:10 PM. Always use your most recent med list.          accu-chek softclix lancets Use as instructed   aspirin 81 MG tablet Take 81 mg by mouth daily.   FREESTYLE LIBRE 14 DAY SENSOR Misc 1 each by Does not apply route every 14 (fourteen) days.   FREESTYLE LITE Devi Use to check blood sugar 5 times per day.   glucose blood test strip Commonly known as:  FREESTYLE LITE Use to check blood sugar 5 times per day   FREESTYLE PRECISION NEO TEST test strip Generic drug:  glucose blood TEST UP TO FOUR TIMES DAILY   insulin regular  100 units/mL injection Commonly known as:  NOVOLIN R Inject 0.68 mLs (68 Units total) into the skin daily.   losartan 50 MG tablet Commonly known as:  COZAAR Take 1 tablet (50 mg total) by mouth daily.   metFORMIN 1000 MG tablet Commonly known as:  GLUCOPHAGE Take 1 tablet by mouth  twice a day with meals   V-GO 20 Kit Use for insulin delivery and change every 24 hours       Allergies: No Known Allergies  Past Medical History:  Diagnosis Date  . Diabetes mellitus ([place]) 2,000  . Erectile dysfunction   . HTN (hypertension)   . Hyperlipidemia   . Personal history of colonic adenomas 07/18/2009  . Renal cancer ([place])    1992, has 1 kidney ; does not see urology any more    Past Surgical History:  Procedure Laterality Date  . COLONOSCOPY    . NEPHRECTOMY  1992    Family History  Problem Relation Age of Onset  . Diabetes Father   . Hypertension Father   . CAD Father        F (MI age 64), Brother in [place] 33  . Prostate cancer Brother        dx age 44  . Colon cancer Neg Hx   . Stroke Neg Hx     Social History:  reports that he has quit smoking. He has never used smokeless tobacco. He reports that he drinks about 1.2 oz of alcohol per week. He reports that he does not use drugs.  Review of Systems -     HYPERLIPIDEMIA:   He is on Slo-Niacin 500 mg twice a day with adequate control of LDL and triglycerides  HDL has been in the 20s previously along with small LDL particles  Particle number was below 1000 in 06/2014 but still has increased small particles  HDL is improved compared to previous levels  Lab Results  Component Value Date   CHOL 102 01/31/2017   HDL 27.90 (L) 01/31/2017   LDLCALC 54 01/31/2017   LDLDIRECT 45.7 05/31/2009   TRIG 102.0 01/31/2017   CHOLHDL 4 01/31/2017    He has mild hypertension, now taking 10 mg lisinopril with good control  He monitors BP at home, and blood pressure readings are 120/70 range usually  At times will have a high  blood pressure when first checked in  the office    BP Readings from Last 3 Encounters:  08/20/17 138/76  05/16/17 (!) 146/80  04/16/17 (!) 151/77   HYPERKALEMIA His potassium is upper normal now, does not use high potassium foods like bananas and oranges in his diet  Lab Results  Component Value Date   K 5.1 08/13/2017   Eye exams: Annual 11/18   Diabetic foot exam was done in 1/19   Examination:   BP 138/76 (BP Location: Left Arm, Patient Position: Sitting, Cuff Size: Normal)   Pulse 84   Ht 5' 9"  (1.753 m)   Wt 176 lb (79.8 kg)   SpO2 95%   BMI 25.99 kg/m   Body mass index is 25.99 kg/m.    Assesment/Plan:   Diabetes type 2, nonobese: See history of present illness for detailed discussion of his current blood sugar patterns, problems identified  He is on insulin pump with the use of regular insulin Currently not on any other treatments including metformin  Although his A1c is not any different his average blood sugars are higher compared to last visit because of less tendency to hypoglycemia overnight and variably high readings around midday and late evening  Today discussed in detail the factors causing his blood sugars to be higher or lower given time Since his fasting readings are on an average fairly good his basal rate is appropriate Most likely was doing better with metformin previously but even with 1 g morning dose that she was getting tendency to hypoglycemia overnight Now his only low sugars are when he is more active around early afternoon without taking a snack or being late for lunch Hyperglycemia occurs after he is breakfast and coffee in the morning especially with his using slow acting regular insulin in the morning for boluses   Recommendations:  He will take his 4 unit bolus even before he has coffee in the morning, after 30 minutes before  Also he will need to try 500 mg metformin at breakfast  Make sure he has a snack before he goes out for  yard work during the early afternoon or have lunch  Probably can take an extra click when blood sugars are over 150 before a meal which can sometimes happen in the morning also  Continue adjusting suppertime bolus clicks based on meal size  Watch portions of carbohydrates at suppertime and lunchtime also   HYPERTENSION: Blood pressure is  controlled with10 mg lisinopril   reassured him that he does not have any evidence of nephropathy   Because of his HYPERKALEMIA tendency will switch from lisinopril to losartan 50 mg daily Will call if blood pressure readings at home are unusually high or low with this change  Counseling time on subjects discussed in assessment and plan sections is over 50% of today's 25 minute visit   Patient Instructions  Metformin 1/2 tab in am  Bolus 2 clicks before coffee       Elayne Snare 08/20/2017, 4:10 PM

## 2017-08-20 ENCOUNTER — Encounter: Payer: Self-pay | Admitting: Endocrinology

## 2017-08-20 ENCOUNTER — Ambulatory Visit: Payer: PPO | Admitting: Endocrinology

## 2017-08-20 VITALS — BP 138/76 | HR 84 | Ht 69.0 in | Wt 176.0 lb

## 2017-08-20 DIAGNOSIS — I10 Essential (primary) hypertension: Secondary | ICD-10-CM

## 2017-08-20 DIAGNOSIS — E875 Hyperkalemia: Secondary | ICD-10-CM | POA: Diagnosis not present

## 2017-08-20 DIAGNOSIS — Z794 Long term (current) use of insulin: Secondary | ICD-10-CM

## 2017-08-20 DIAGNOSIS — E1165 Type 2 diabetes mellitus with hyperglycemia: Secondary | ICD-10-CM

## 2017-08-20 MED ORDER — LOSARTAN POTASSIUM 50 MG PO TABS: 50.0000 mg | ORAL_TABLET | Freq: Every day | ORAL | 3 refills | Status: DC

## 2017-08-20 NOTE — Patient Instructions (Signed)
Metformin 1/2 tab in am  Bolus 2 clicks before coffee

## 2017-09-06 ENCOUNTER — Other Ambulatory Visit: Payer: Self-pay

## 2017-09-06 ENCOUNTER — Other Ambulatory Visit: Payer: Self-pay | Admitting: Endocrinology

## 2017-09-10 ENCOUNTER — Encounter: Payer: Self-pay | Admitting: Internal Medicine

## 2017-09-10 ENCOUNTER — Other Ambulatory Visit: Payer: Self-pay | Admitting: Endocrinology

## 2017-09-10 ENCOUNTER — Ambulatory Visit (INDEPENDENT_AMBULATORY_CARE_PROVIDER_SITE_OTHER): Payer: PPO | Admitting: Internal Medicine

## 2017-09-10 VITALS — BP 162/70 | HR 82 | Temp 97.9°F | Resp 14 | Ht 69.0 in | Wt 176.0 lb

## 2017-09-10 DIAGNOSIS — Z0000 Encounter for general adult medical examination without abnormal findings: Secondary | ICD-10-CM | POA: Diagnosis not present

## 2017-09-10 DIAGNOSIS — Z1159 Encounter for screening for other viral diseases: Secondary | ICD-10-CM

## 2017-09-10 DIAGNOSIS — Z23 Encounter for immunization: Secondary | ICD-10-CM

## 2017-09-10 MED ORDER — DICLOFENAC SODIUM 1 % TD GEL: 4.0000 g | Freq: Two times a day (BID) | TRANSDERMAL | 2 refills | Status: DC | PRN

## 2017-09-10 NOTE — Patient Instructions (Signed)
  GO TO THE FRONT DESK Schedule labs to be done 7 to 10 days from today.  No need to fast  Schedule your next appointment for a sickle exam in 1 year  Take Losartan consistently  Check the  blood pressure 2 or 3 times a  week   Be sure your blood pressure is between 110/65 and  135/85. If it is consistently higher or lower, let me know  Consider Medicare wellness with [facility] Haring

## 2017-09-10 NOTE — Assessment & Plan Note (Addendum)
Td 09/2017;  pneumonia shot 2016, prevnar 2016; zostavax - 2012; shingrex not available  -- [facility]  colonoscopy 3-11, multiple polyps, cscope 08-2016, DR Carlean Purl, next per GI -Prostate ca screening: brother dx w/ prostate ca age 70, DRE normal, checking a PSA  --counseled: diet and exercise; recommend a Medicare wellness visit + FH CAD: asx, on ASA --Labs: Will come back in 7 to 10 days: CMP, CBC, PSA, hep C

## 2017-09-10 NOTE — Progress Notes (Signed)
Pre visit review using our clinic review tool, if applicable. No additional management support is needed unless otherwise documented below in the visit note. 

## 2017-09-10 NOTE — Progress Notes (Signed)
Subjective:    Patient ID: Manuel Petty, male    DOB: 16-May-1945, 72 y.o.   MRN: [id number]  DOS:  09/10/2017 Type of visit - description : cpx Interval history: In general feeling well, does complain of hand pain.  Mostly at the knuckles PIPs, DIPs.  Has on and off trigger phenomena in different fingers.  Denies any redness, puffiness, warmness.  He does get stiff in the morning sometimes for as long as 2 hours.  He is very active with his hands. HTN: Recently switched to losartan.  BP Readings from Last 3 Encounters:  09/10/17 (!) 162/70  08/20/17 138/76  05/16/17 (!) 146/80     Review of Systems  Other than above, a 14 point review of systems is negative      Past Medical History:  Diagnosis Date  . Diabetes mellitus ([place]) 2,000  . Erectile dysfunction   . HTN (hypertension)   . Hyperlipidemia   . Personal history of colonic adenomas 07/18/2009  . Renal cancer ([place])    1992, has 1 kidney ; does not see urology any more    Past Surgical History:  Procedure Laterality Date  . COLONOSCOPY    . NEPHRECTOMY  1992    Social History   Socioeconomic History  . Marital status: Married    Spouse name: Not on file  . Number of children: 1  . Years of education: Not on file  . Highest education level: Not on file  Occupational History  . Occupation: retired   Scientific laboratory technician  . Financial resource strain: Not on file  . Food insecurity:    Worry: Not on file    Inability: Not on file  . Transportation needs:    Medical: Not on file    Non-medical: Not on file  Tobacco Use  . Smoking status: Former Research scientist (life sciences)  . Smokeless tobacco: Never Used  . Tobacco comment: quit in 1992, 2 ppd  Substance and Sexual Activity  . Alcohol use: Yes    Alcohol/week: 1.2 oz    Types: 2 Cans of beer per week  . Drug use: No    Comment: used to use marihuana    . Sexual activity: Not on file  Lifestyle  . Physical activity:    Days per week: Not on file    Minutes per session:  Not on file  . Stress: Not on file  Relationships  . Social connections:    Talks on phone: Not on file    Gets together: Not on file    Attends religious service: Not on file    Active member of club or organization: Not on file    Attends meetings of clubs or organizations: Not on file    Relationship status: Not on file  . Intimate partner violence:    Fear of current or ex partner: Not on file    Emotionally abused: Not on file    Physically abused: Not on file    Forced sexual activity: Not on file  Other Topics Concern  . Not on file  Social History Narrative   Lives w/ wife     Family History  Problem Relation Age of Onset  . Diabetes Father   . Hypertension Father   . CAD Father        F (MI age 31), Brother in hois 42  . CAD Brother   . Prostate cancer Brother        dx age 36  . Cancer  Brother        blood cancer   . Colon cancer Neg Hx   . Stroke Neg Hx      Allergies as of 09/10/2017   No Known Allergies     Medication List        Accurate as of 09/10/17 11:59 PM. Always use your most recent med list.          aspirin 81 MG tablet Take 81 mg by mouth daily.   diclofenac sodium 1 % Gel Commonly known as:  VOLTAREN Apply 4 g topically 2 (two) times daily as needed.   FREESTYLE LIBRE 14 DAY SENSOR Misc 1 each by Does not apply route every 14 (fourteen) days.   FREESTYLE LITE Devi Use to check blood sugar 5 times per day.   insulin regular 100 units/mL injection Commonly known as:  NOVOLIN R Inject 0.68 mLs (68 Units total) into the skin daily.   losartan 50 MG tablet Commonly known as:  COZAAR Take 1 tablet (50 mg total) by mouth daily.   metFORMIN 1000 MG tablet Commonly known as:  GLUCOPHAGE Take 1 tablet by mouth  twice a day with meals   V-GO 20 Kit Use for insulin delivery and change every 24 hours          Objective:   Physical Exam BP (!) 162/70 (BP Location: Left Arm, Patient Position: Sitting, Cuff Size: Small)    Pulse 82   Temp 97.9 F (36.6 C) (Oral)   Resp 14   Ht 5' 9"  (1.753 m)   Wt 176 lb (79.8 kg)   SpO2 98%   BMI 25.99 kg/m  General:   Well developed, well nourished . NAD.  Neck: No  thyromegaly  HEENT:  Normocephalic . Face symmetric, atraumatic Lungs:  CTA B Normal respiratory effort, no intercostal retractions, no accessory muscle use. Heart: RRR,  no murmur.  No pretibial edema bilaterally  Abdomen:  Not distended, soft, non-tender. No rebound or rigidity.  No bruits MSK: Hands and wrists without synovitis but some bony changes consistent with DJD. Rectal: External abnormalities: none. Normal sphincter tone. No rectal masses or tenderness.  No stools found Prostate: Prostate gland firm and smooth, no enlargement, nodularity, tenderness, mass, asymmetry or induration  skin: Exposed areas without rash. Not pale. Not jaundice Neurologic:  alert & oriented X3.  Speech normal, gait appropriate for age and unassisted Strength symmetric and appropriate for age.  Psych: Cognition and judgment appear intact.  Cooperative with normal attention span and concentration.  Behavior appropriate. No anxious or depressed appearing.     Assessment & Plan:   Assessment DM--dr Dwyane Dee HTN Hyperlipidemia. E.D. Renal cancer, 1992, nephrectomy x1,, released from urology  PLAN: DM: Per Endo HTN: Few days ago was switch from lisinopril to losartan due to mild hyperkalemia.  He has taken it on and off because he felt losartan increase his DJD symptoms.  Plan: Take losartan regularly, check ambulatory BPs, BMP in few days.  See instructions. Hyperlipidemia: Used to be on niacin, now diet controlled, last LDL 54  DJD, hands: Tylenol and  Voltaren gel as needed, icing.  Although he has prolonged stiffness, there is no much evidence of RA.    RTC 7 to 10 days for labs Next visit one year CPX

## 2017-09-11 NOTE — Assessment & Plan Note (Signed)
DM: Per Endo HTN: Few days ago was switch from lisinopril to losartan due to mild hyperkalemia.  He has taken it on and off because he felt losartan increase his DJD symptoms.  Plan: Take losartan regularly, check ambulatory BPs, BMP in few days.  See instructions. Hyperlipidemia: Used to be on niacin, now diet controlled, last LDL 54  DJD, hands: Tylenol and  Voltaren gel as needed, icing.  Although he has prolonged stiffness, there is no much evidence of RA.    RTC 7 to 10 days for labs Next visit one year CPX

## 2017-09-19 ENCOUNTER — Other Ambulatory Visit (INDEPENDENT_AMBULATORY_CARE_PROVIDER_SITE_OTHER): Payer: PPO

## 2017-09-19 DIAGNOSIS — Z0000 Encounter for general adult medical examination without abnormal findings: Secondary | ICD-10-CM | POA: Diagnosis not present

## 2017-09-19 DIAGNOSIS — Z1159 Encounter for screening for other viral diseases: Secondary | ICD-10-CM

## 2017-09-19 LAB — COMPREHENSIVE METABOLIC PANEL
ALBUMIN: 4.3 g/dL (ref 3.5–5.2)
ALT: 31 U/L (ref 0–53)
AST: 18 U/L (ref 0–37)
Alkaline Phosphatase: 78 U/L (ref 39–117)
BUN: 13 mg/dL (ref 6–23)
CHLORIDE: 106 meq/L (ref 96–112)
CO2: 26 meq/L (ref 19–32)
CREATININE: 0.82 mg/dL (ref 0.40–1.50)
Calcium: 9 mg/dL (ref 8.4–10.5)
GFR: 98.85 mL/min (ref >60.00)
Glucose, Bld: 172 mg/dL — ABNORMAL HIGH (ref 70–99)
Potassium: 4.9 mEq/L (ref 3.5–5.1)
SODIUM: 139 meq/L (ref 135–145)
Total Bilirubin: 0.5 mg/dL (ref 0.2–1.2)
Total Protein: 6.8 g/dL (ref 6.0–8.3)

## 2017-09-19 LAB — CBC WITH DIFFERENTIAL/PLATELET
BASOS ABS: 0.1 10*3/uL (ref 0.0–0.1)
Basophils Relative: 1 % (ref 0.0–3.0)
EOS ABS: 0.4 10*3/uL (ref 0.0–0.7)
Eosinophils Relative: 7.8 % — ABNORMAL HIGH (ref 0.0–5.0)
HCT: 40.4 % (ref 39.0–52.0)
HEMOGLOBIN: 13.7 g/dL (ref 13.0–17.0)
LYMPHS PCT: 29 % (ref 12.0–46.0)
Lymphs Abs: 1.7 10*3/uL (ref 0.7–4.0)
MCHC: 34 g/dL (ref 30.0–36.0)
MCV: 97 fl (ref 78.0–100.0)
Monocytes Absolute: 0.5 10*3/uL (ref 0.1–1.0)
Monocytes Relative: 9.2 % (ref 3.0–12.0)
Neutro Abs: 3 10*3/uL (ref 1.4–7.7)
Neutrophils Relative %: 53 % (ref 43.0–77.0)
PLATELETS: 266 10*3/uL (ref 150.0–400.0)
RBC: 4.16 Mil/uL — AB (ref 4.22–5.81)
RDW: 13.6 % (ref 11.5–15.5)
WBC: 5.7 10*3/uL (ref 4.0–10.5)

## 2017-09-19 LAB — PSA: PSA: 0.65 ng/mL (ref 0.10–4.00)

## 2017-09-20 LAB — HEPATITIS C ANTIBODY
Hepatitis C Ab: NONREACTIVE
SIGNAL TO CUT-OFF: 0.24 (ref <1.00)

## 2017-09-22 ENCOUNTER — Encounter: Payer: Self-pay | Admitting: Internal Medicine

## 2017-09-26 ENCOUNTER — Ambulatory Visit: Payer: PPO | Admitting: *Deleted

## 2017-11-04 ENCOUNTER — Encounter: Payer: Self-pay | Admitting: Endocrinology

## 2017-11-05 ENCOUNTER — Other Ambulatory Visit: Payer: Self-pay

## 2017-11-05 MED ORDER — V-GO 20 KIT
PACK | 3 refills | Status: DC
Start: 2017-11-05 — End: 2018-10-21

## 2017-11-12 ENCOUNTER — Other Ambulatory Visit (INDEPENDENT_AMBULATORY_CARE_PROVIDER_SITE_OTHER): Payer: PPO

## 2017-11-12 DIAGNOSIS — Z794 Long term (current) use of insulin: Secondary | ICD-10-CM

## 2017-11-12 DIAGNOSIS — E1165 Type 2 diabetes mellitus with hyperglycemia: Secondary | ICD-10-CM | POA: Diagnosis not present

## 2017-11-12 LAB — BASIC METABOLIC PANEL
BUN: 13 mg/dL (ref 6–23)
CO2: 29 mEq/L (ref 19–32)
CREATININE: 0.8 mg/dL (ref 0.40–1.50)
Calcium: 9.4 mg/dL (ref 8.4–10.5)
Chloride: 104 mEq/L (ref 96–112)
GFR: 101.66 mL/min (ref >60.00)
Glucose, Bld: 185 mg/dL — ABNORMAL HIGH (ref 70–99)
POTASSIUM: 5.2 meq/L — AB (ref 3.5–5.1)
Sodium: 141 mEq/L (ref 135–145)

## 2017-11-12 LAB — LIPID PANEL
CHOL/HDL RATIO: 4
Cholesterol: 134 mg/dL (ref 0–200)
HDL: 31.9 mg/dL — ABNORMAL LOW (ref >39.00)
LDL CALC: 84 mg/dL (ref 0–99)
NONHDL: 102.44
TRIGLYCERIDES: 94 mg/dL (ref 0.0–149.0)
VLDL: 18.8 mg/dL (ref 0.0–40.0)

## 2017-11-12 LAB — HEMOGLOBIN A1C: HEMOGLOBIN A1C: 7.2 % — AB (ref 4.6–6.5)

## 2017-11-15 ENCOUNTER — Telehealth: Payer: Self-pay

## 2017-11-15 LAB — FRUCTOSAMINE: Fructosamine: 261 umol/L (ref 190–270)

## 2017-11-15 NOTE — Telephone Encounter (Signed)
-----   Message from Colon Branch, MD sent at 11/15/2017  9:02 AM EDT ----- Please send the patient a message along with a low potassium diet. Let him know his potassium was 5.1, slightly elevated;  for now we simply need to follow a low potassium diet

## 2017-11-15 NOTE — Telephone Encounter (Signed)
My Chart message sent

## 2017-11-19 ENCOUNTER — Other Ambulatory Visit: Payer: Self-pay

## 2017-11-19 ENCOUNTER — Ambulatory Visit: Payer: PPO | Admitting: Endocrinology

## 2017-11-19 ENCOUNTER — Encounter: Payer: Self-pay | Admitting: Endocrinology

## 2017-11-19 VITALS — BP 142/72 | HR 68 | Ht 69.0 in | Wt 178.6 lb

## 2017-11-19 DIAGNOSIS — E1165 Type 2 diabetes mellitus with hyperglycemia: Secondary | ICD-10-CM | POA: Diagnosis not present

## 2017-11-19 DIAGNOSIS — Z794 Long term (current) use of insulin: Secondary | ICD-10-CM

## 2017-11-19 DIAGNOSIS — E875 Hyperkalemia: Secondary | ICD-10-CM | POA: Diagnosis not present

## 2017-11-19 NOTE — Progress Notes (Signed)
Patient ID: Manuel Petty, male   DOB: 08/16/1945, 72 y.o.   MRN: [id number]   Reason for Appointment: follow-up diabetes  History of Present Illness   Diagnosis: Type 2 DIABETES MELITUS, date of diagnosis: 2007   Previous history: He had been on large doses of Lantus before he was given Victoza in addition and this improved his blood sugar control and reduced his insulin requirement However he subsequently started requiring mealtime coverage with large meals also  RECENT history:  Insulin regimen: [employer] brand Regular Insulin  V-go 20 units basal, mealtime boluses breakfast 4-6  units, lunch and supper 6-8 units  A1c higher at 7.2 compared to a stable level of 6.5 previously  Current blood sugar patterns and problems identified:  Not clear why his blood sugars are not as well controlled over the 3 months average since his recent CGM averages almost the same at 155 as before  He does have POSTPRANDIAL hyperglycemia after breakfast and evening meal although not consistently  He was told to bolus at least 30 minutes or more before breakfast when he is drinking his coffee since he continues to have rising blood sugar before breakfast, however he has not done this  Also despite blood sugars frequently going up after breakfast he will not increase his bolus insulin as directed  Also has variably high blood sugars in the evenings after supper based on his carbohydrate intake and occasionally eating some sweets  At least in the last couple of days he has cut back on high carbohydrate foods like potatoes  He takes metformin only in the morning, previously with evening doses he was having some hypoglycemia overnight  Currently also occasionally may have low normal sugars overnight and he checks them periodically also  His CGM monitor time is off by 1 hour and difficult to interpret his bolus times and mealtimes accurately  He has not been doing any exercise lately  and only sporadically doing some walking  Has gained 2 pounds   Non-insulin hypoglycemic drugs: Metformin 1 g 1x a day      Side effects from medications: None  Proper timing of medications in relation to meals: Yes.          Monitors blood glucose:  Several times a day.    Glucometer:  FreeStyle Libre            Blood sugars are above 180 about 27% of the time in the last 2 weeks  PRE-MEAL Fasting Lunch Dinner Bedtime Overall  Glucose range:       Mean/median:  144   138  160  155+/-42   POST-MEAL PC Breakfast PC Lunch PC Dinner  Glucose range:     Mean/median:  192   190     Meals:2- 3 meals per day. Lunch 2-3 pm; dinner 6 pm.  For breakfast he will usually have English muffin and eggs      Dietician visit: Most recent: 01/5187           Complications: are: None     Wt Readings from Last 3 Encounters:  11/19/17 178 lb 9.6 oz (81 kg)  09/10/17 176 lb (79.8 kg)  08/20/17 176 lb (79.8 kg)      Lab Results  Component Value Date   HGBA1C 7.2 (H) 11/12/2017   HGBA1C 6.5 08/13/2017   HGBA1C 6.5 04/30/2017   Lab Results  Component Value Date   MICROALBUR 10.5 (H) 09/25/2016   [place] 84 11/12/2017  CREATININE 0.80 11/12/2017    Other active problems discussed in review of systems   Allergies as of 11/19/2017   No Known Allergies     Medication List        Accurate as of 11/19/17  9:10 PM. Always use your most recent med list.          aspirin 81 MG tablet Take 81 mg by mouth daily.   diclofenac sodium 1 % Gel Commonly known as:  VOLTAREN Apply 4 g topically 2 (two) times daily as needed.   FREESTYLE LIBRE 14 DAY SENSOR Misc 1 each by Does not apply route every 14 (fourteen) days.   insulin regular 100 units/mL injection Commonly known as:  NOVOLIN R Inject 0.68 mLs (68 Units total) into the skin daily.   losartan 50 MG tablet Commonly known as:  COZAAR Take 1 tablet (50 mg total) by mouth daily.   metFORMIN 1000 MG tablet Commonly known as:   GLUCOPHAGE Take 1 tablet by mouth  twice a day with meals   V-GO 20 Kit Use for insulin delivery and change every 24 hours       Allergies: No Known Allergies  Past Medical History:  Diagnosis Date  . Diabetes mellitus ([place]) 2,000  . Erectile dysfunction   . HTN (hypertension)   . Hyperlipidemia   . Personal history of colonic adenomas 07/18/2009  . Renal cancer ([place])    1992, has 1 kidney ; does not see urology any more    Past Surgical History:  Procedure Laterality Date  . COLONOSCOPY    . NEPHRECTOMY  1992    Family History  Problem Relation Age of Onset  . Diabetes Father   . Hypertension Father   . CAD Father        F (MI age 84), Brother in ho[place] 13  . CAD Brother   . Prostate cancer Brother        dx age 67  . Cancer Brother        blood cancer   . Colon cancer Neg Hx   . Stroke Neg Hx     Social History:  reports that he has quit smoking. He has never used smokeless tobacco. He reports that he drinks about 1.2 oz of alcohol per week. He reports that he does not use drugs.  Review of Systems -     HYPERLIPIDEMIA:   He has been on Slo-Niacin OTC 500 mg twice a day with adequate control of LDL and triglycerides  HDL has been in the 20s previously along with small LDL particles  Particle number was below 1000 in 06/2014 but still has increased small particles  HDL low normal  Lab Results  Component Value Date   CHOL 134 11/12/2017   HDL 31.90 (L) 11/12/2017   LDLCALC 84 11/12/2017   LDLDIRECT 45.7 05/31/2009   TRIG 94.0 11/12/2017   CHOLHDL 4 11/12/2017    He has mild hypertension, now taking LOSARTAN 50 mg instead of lisinopril since his potassium was high previously Blood pressure relatively higher in the office frequently  He monitors BP at home, and blood pressure readings are 117-120/70 range usually     BP Readings from Last 3 Encounters:  11/19/17 (!) 142/72  09/10/17 (!) 162/70  08/20/17 138/76   HYPERKALEMIA His potassium is  mildly increased again despite avoiding high potassium foods but he is eating more potatoes lately   Lab Results  Component Value Date   K 5.2 (H) 11/12/2017  Eye exams: Annual 11/18   Diabetic foot exam was done in 1/19   Examination:   BP (!) 142/72 (BP Location: Left Arm, Patient Position: Sitting, Cuff Size: Normal)   Pulse 68   Ht 5' 9"  (1.753 m)   Wt 178 lb 9.6 oz (81 kg)   SpO2 98%   BMI 26.37 kg/m   Body mass index is 26.37 kg/m.    Assesment/Plan:   Diabetes type 2, nonobese: See history of present illness for detailed discussion of his current blood sugar patterns, problems identified   His A1c is higher at 7.2  This is mostly related to postprandial HYPERGLYCEMIA as discussed above which is partly related to variable carbohydrate intake Also has a dawn phenomenon and blood sugars are rising before breakfast frequently Blood sugars may be better with using analog insulin but he can only afford regular insulin  Discussed managing mealtime insulin in detail He will try to bolus at least 30 minutes before breakfast, probably before he has his coffee in the morning or when the blood sugars appear to be rising after getting up He will also need to bolus 30 minutes before each meal More consistent exercise He will probably need to take at least 2 units more for breakfast coverage   HYPERTENSION: Blood pressure is  controlled although still appears to have whitecoat syndrome Home readings are good   HYPERKALEMIA: Not clear if this is related to losartan since he can do better with restricting high potassium foods such as potatoes will repeat potassium level in 2 weeks   Patient Instructions  CLICK AT [place] in am  Lab in 2 weeks    Counseling time on subjects discussed in assessment and plan sections is over 50% of today's 25 minute visit   Elayne Snare 11/19/2017, 9:10 PM

## 2017-11-19 NOTE — Patient Instructions (Signed)
CLICK AT [place] in am  Lab in 2 weeks

## 2017-11-20 ENCOUNTER — Other Ambulatory Visit: Payer: Self-pay

## 2017-11-20 MED ORDER — GLUCOSE BLOOD VI STRP: ORAL_STRIP | 12 refills | Status: DC

## 2017-11-25 ENCOUNTER — Telehealth: Payer: Self-pay

## 2017-11-25 NOTE — Telephone Encounter (Signed)
Okay to draw the blood work here and forward to Dr. Dwyane Dee

## 2017-11-25 NOTE — Telephone Encounter (Signed)
Please call Pt- okay to schedule lab appt.

## 2017-11-25 NOTE — Telephone Encounter (Signed)
Please advise 

## 2017-11-25 NOTE — Telephone Encounter (Signed)
Copied from [place] [phone]. Topic: Appointment Scheduling - Scheduling Inquiry for Clinic >> Nov 25, 2017  3:52 PM Synthia Innocent wrote: Reason for CRM: Patient is requesting to have Dr Dwyane Dee lab work done on 12/10/17 at [facility], states he always gets done there. Will Dr Larose Kells order? Potassium order in system. Please advise

## 2017-11-26 NOTE — Telephone Encounter (Signed)
Pt scheduled for 12/10/17 @ 10 am.

## 2017-12-10 ENCOUNTER — Other Ambulatory Visit (INDEPENDENT_AMBULATORY_CARE_PROVIDER_SITE_OTHER): Payer: PPO

## 2017-12-10 DIAGNOSIS — E875 Hyperkalemia: Secondary | ICD-10-CM

## 2017-12-10 LAB — POTASSIUM: POTASSIUM: 4.8 meq/L (ref 3.5–5.1)

## 2017-12-13 ENCOUNTER — Other Ambulatory Visit: Payer: Self-pay | Admitting: Endocrinology

## 2018-02-03 ENCOUNTER — Telehealth: Payer: Self-pay | Admitting: Endocrinology

## 2018-02-03 ENCOUNTER — Other Ambulatory Visit: Payer: Self-pay | Admitting: Endocrinology

## 2018-02-03 DIAGNOSIS — Z794 Long term (current) use of insulin: Principal | ICD-10-CM

## 2018-02-03 DIAGNOSIS — E1165 Type 2 diabetes mellitus with hyperglycemia: Secondary | ICD-10-CM

## 2018-02-03 NOTE — Telephone Encounter (Signed)
Please advise on below  

## 2018-02-03 NOTE — Telephone Encounter (Signed)
Pt called about trying to get labs done and the orders are not in the system. Pt would like to get labs done on 02/05/2018. Please and Thank you!  Call pt @ [phone].

## 2018-02-03 NOTE — Telephone Encounter (Signed)
His insurance will not pay for A1c until 02/12/2018 so he cannot go this week.  Labs have been ordered

## 2018-02-04 NOTE — Telephone Encounter (Signed)
LVM for pt letting him know 

## 2018-02-05 ENCOUNTER — Other Ambulatory Visit: Payer: Self-pay | Admitting: Endocrinology

## 2018-02-06 ENCOUNTER — Other Ambulatory Visit (INDEPENDENT_AMBULATORY_CARE_PROVIDER_SITE_OTHER): Payer: PPO

## 2018-02-06 DIAGNOSIS — Z794 Long term (current) use of insulin: Secondary | ICD-10-CM

## 2018-02-06 DIAGNOSIS — E1165 Type 2 diabetes mellitus with hyperglycemia: Secondary | ICD-10-CM

## 2018-02-06 LAB — COMPREHENSIVE METABOLIC PANEL
ALK PHOS: 71 U/L (ref 39–117)
ALT: 31 U/L (ref 0–53)
AST: 18 U/L (ref 0–37)
Albumin: 4.3 g/dL (ref 3.5–5.2)
BILIRUBIN TOTAL: 0.4 mg/dL (ref 0.2–1.2)
BUN: 15 mg/dL (ref 6–23)
CO2: 27 mEq/L (ref 19–32)
Calcium: 9 mg/dL (ref 8.4–10.5)
Chloride: 108 mEq/L (ref 96–112)
Creatinine, Ser: 0.77 mg/dL (ref 0.40–1.50)
GFR: 106.18 mL/min (ref >60.00)
Glucose, Bld: 132 mg/dL — ABNORMAL HIGH (ref 70–99)
Potassium: 4.5 mEq/L (ref 3.5–5.1)
Sodium: 141 mEq/L (ref 135–145)
TOTAL PROTEIN: 6.7 g/dL (ref 6.0–8.3)

## 2018-02-06 LAB — MICROALBUMIN / CREATININE URINE RATIO
Creatinine,U: 101.4 mg/dL
MICROALB/CREAT RATIO: 6.3 mg/g (ref 0.0–30.0)
Microalb, Ur: 6.4 mg/dL — ABNORMAL HIGH (ref 0.0–1.9)

## 2018-02-06 LAB — HEMOGLOBIN A1C: Hgb A1c MFr Bld: 7.1 % — ABNORMAL HIGH (ref 4.6–6.5)

## 2018-02-18 ENCOUNTER — Ambulatory Visit (INDEPENDENT_AMBULATORY_CARE_PROVIDER_SITE_OTHER): Payer: PPO | Admitting: Endocrinology

## 2018-02-18 ENCOUNTER — Encounter: Payer: Self-pay | Admitting: Endocrinology

## 2018-02-18 VITALS — BP 146/84 | HR 72 | Ht 70.0 in | Wt 179.0 lb

## 2018-02-18 DIAGNOSIS — E1165 Type 2 diabetes mellitus with hyperglycemia: Secondary | ICD-10-CM

## 2018-02-18 DIAGNOSIS — E875 Hyperkalemia: Secondary | ICD-10-CM

## 2018-02-18 DIAGNOSIS — Z794 Long term (current) use of insulin: Secondary | ICD-10-CM

## 2018-02-18 DIAGNOSIS — I10 Essential (primary) hypertension: Secondary | ICD-10-CM

## 2018-02-18 NOTE — Patient Instructions (Signed)
Exercise

## 2018-02-18 NOTE — Progress Notes (Signed)
Patient ID: BRISTOL SOY, male   DOB: [birth year], 72 y.o.   MRN: [id number]   Reason for Appointment: follow-up diabetes  History of Present Illness   Diagnosis: Type 2 DIABETES MELITUS, date of diagnosis: 2007   Previous history: He had been on large doses of Lantus before he was given Victoza in addition and this improved his blood sugar control and reduced his insulin requirement However he subsequently started requiring mealtime coverage with large meals also  RECENT history:  Insulin regimen: [employer] brand Regular Insulin  V-go 20 units basal, mealtime boluses breakfast 4-6  units, lunch and supper 6-8 units Non-insulin hypoglycemic drugs: Metformin 1 g 1x a day    A1c still above 7% 7.1, has been as low as 6.5 been stable in the past  Current blood sugar patterns and problems identified:  On his last visit he was having blood sugars averaging 190 after supper  His CGM indicates that his postprandial readings are better  He says that for the last 2 weeks or so he has cut back on carbohydrates like bread and potatoes  Even with the change he is not needing less mealtime bolus coverage  Blood sugars are not spiking after breakfast as before also with his trying to bolus up to 30 minutes before breakfast when he is drinking his coffee  He does have variable overnight blood sugars; does tend to have occasional readings in the mid 60s around midnight with only minimal symptoms at times  He says that he will occasionally forget to bolus 30-minute before eating supper and may bolus right before eating  Overall having relatively stable blood sugars throughout the day as judged by his CGM now, daily average recently ranging between 133 up to 162  He is taking metformin only in the morning since previously would tend to have hypoglycemia overnight with twice a day doses       Side effects from medications: None  Proper timing of medications in relation to  meals: Yes.          Monitors blood glucose:  Several times a day.    Glucometer:  FreeStyle Libre          CGM use % of time 98  Average and SD 147  Time in range    84    %  % Time Above 180 16  % Time above 250 1  % Time Below target 0       PRE-MEAL Fasting Lunch Dinner Bedtime Overall  Glucose range:  104-190      Mean/median: 148    153  147   POST-MEAL PC Breakfast PC Lunch PC Dinner  Glucose range:     Mean/median:  159  135  172      Previous readings:  PRE-MEAL Fasting Lunch Dinner Bedtime Overall  Glucose range:       Mean/median:  144   138  160  155+/-42   POST-MEAL PC Breakfast PC Lunch PC Dinner  Glucose range:     Mean/median:  192   190     Meals:2- 3 meals per day. Lunch 2-3 pm; dinner 6 pm.  For breakfast he will usually have English muffin and eggs      Dietician visit: Most recent: 09/7901           Complications: are: None     Wt Readings from Last 3 Encounters:  02/18/18 179 lb (81.2 kg)  11/19/17 178 lb 9.6 oz (81  kg)  09/10/17 176 lb (79.8 kg)      Lab Results  Component Value Date   HGBA1C 7.1 (H) 02/06/2018   HGBA1C 7.2 (H) 11/12/2017   HGBA1C 6.5 08/13/2017   Lab Results  Component Value Date   MICROALBUR 6.4 (H) 02/06/2018   LDLCALC 84 11/12/2017   CREATININE 0.77 02/06/2018    Other active problems discussed in review of systems   Allergies as of 02/18/2018   No Known Allergies     Medication List        Accurate as of 02/18/18  9:28 AM. Always use your most recent med list.          aspirin 81 MG tablet Take 81 mg by mouth daily.   diclofenac sodium 1 % Gel Commonly known as:  VOLTAREN Apply 4 g topically 2 (two) times daily as needed.   FREESTYLE LIBRE 14 DAY SENSOR Misc USE AS DIRECTED AND  CHANGE  EVERY  14  DAYS   glucose blood test strip USE TO CHECK BLOOD SUGAR UP TO 3 TIMES DAILY.   insulin regular 100 units/mL injection Commonly known as:  NOVOLIN R,HUMULIN R Inject 0.68 mLs (68 Units total)  into the skin daily.   losartan 50 MG tablet Commonly known as:  COZAAR TAKE 1 TABLET(50 MG) BY MOUTH DAILY   metFORMIN 1000 MG tablet Commonly known as:  GLUCOPHAGE Take 1 tablet by mouth  twice a day with meals   V-GO 20 Kit Use for insulin delivery and change every 24 hours       Allergies: No Known Allergies  Past Medical History:  Diagnosis Date  . Diabetes mellitus ([place]) 2,000  . Erectile dysfunction   . HTN (hypertension)   . Hyperlipidemia   . Personal history of colonic adenomas 07/18/2009  . Renal cancer ([place])    1992, has 1 kidney ; does not see urology any more    Past Surgical History:  Procedure Laterality Date  . COLONOSCOPY    . NEPHRECTOMY  1992    Family History  Problem Relation Age of Onset  . Diabetes Father   . Hypertension Father   . CAD Father        F (MI age 51), Brother in [place] 69  . CAD Brother   . Prostate cancer Brother        dx age 70  . Cancer Brother        blood cancer   . Colon cancer Neg Hx   . Stroke Neg Hx     Social History:  reports that he has quit smoking. He has never used smokeless tobacco. He reports that he drinks about 2.0 standard drinks of alcohol per week. He reports that he does not use drugs.  Review of Systems -     HYPERLIPIDEMIA:   He has been on Slo-Niacin OTC 500 mg twice a day with adequate control of LDL and triglycerides  HDL has been in the 20s previously along with small LDL particles  Particle number was below 1000 in 06/2014 but still has increased small particles  HDL low normal  Lab Results  Component Value Date   CHOL 134 11/12/2017   HDL 31.90 (L) 11/12/2017   LDLCALC 84 11/12/2017   LDLDIRECT 45.7 05/31/2009   TRIG 94.0 11/12/2017   CHOLHDL 4 11/12/2017    He has mild hypertension, now taking LOSARTAN 50 mg instead of lisinopril since his potassium was high previously Blood pressure relatively higher in the  office frequently  He monitors BP at home, and blood pressure  readings are 115-120/70 range usually   BP Readings from Last 3 Encounters:  02/18/18 (!) 146/84  11/19/17 (!) 142/72  09/10/17 (!) 162/70   HYPERKALEMIA His potassium is back to normal despite continuing losartan At least for the last 2 weeks he has been cutting back on foods like potatoes   Lab Results  Component Value Date   K 4.5 02/06/2018   Eye exams: Annual 11/18   Diabetic foot exam was done in 1/19   Examination:   BP (!) 146/84   Pulse 72   Ht _0  (1.778 m)   Wt 179 lb (81.2 kg)   SpO2 97%   BMI 25.68 kg/m   Body mass index is 25.68 kg/m.    Assesment/Plan:   Diabetes type 2, nonobese: See history of present illness for detailed discussion of his current blood sugar patterns, problems identified   His A1c is about the same at 7.1 His blood sugars have improved only in the last couple of weeks with his cutting back on carbohydrates at his meals Also with trying to bolus more consistently 30 minutes before breakfast he is having less postprandial hypoglycemia in the morning Fasting readings are probably higher than ideal but he still tends to have low normal readings at bedtime related to his taking regular insulin instead of Humalog at mealtimes No change recommended  Discussed day-to-day management of boluses, glucose monitoring, blood sugar targets, timing of insulin, differences between analog and human insulin as well as need for exercise and balanced meals Reviewed CGM download and findings with the patient  HYPERTENSION: Blood pressure is  controlled although again appears to have whitecoat syndrome Home readings are well controlled  No microalbuminuria  HYPERKALEMIA: Resolved with improving diet  Counseling time on subjects discussed in assessment and plan sections is over 50% of today's 25 minute visit   There are no Patient Instructions on file for this visit.     Elayne Snare 02/18/2018, 9:28 AM

## 2018-03-25 DIAGNOSIS — E119 Type 2 diabetes mellitus without complications: Secondary | ICD-10-CM | POA: Diagnosis not present

## 2018-03-25 DIAGNOSIS — H524 Presbyopia: Secondary | ICD-10-CM | POA: Diagnosis not present

## 2018-03-25 LAB — HM DIABETES EYE EXAM

## 2018-04-02 ENCOUNTER — Other Ambulatory Visit: Payer: Self-pay | Admitting: Endocrinology

## 2018-04-10 ENCOUNTER — Other Ambulatory Visit: Payer: Self-pay | Admitting: Endocrinology

## 2018-06-02 ENCOUNTER — Telehealth: Payer: Self-pay | Admitting: Internal Medicine

## 2018-06-02 NOTE — Telephone Encounter (Signed)
A1c and BMP have already been ordered

## 2018-06-02 NOTE — Telephone Encounter (Signed)
Copied from [place] [id number]. Topic: Quick Communication - See Telephone Encounter >> Jun 02, 2018 11:51 AM Blase Mess A wrote: CRM for notification. See Telephone encounter for: 06/02/18.  Patient is calling to schedule lab work to check his diabetes after a follow up visit for Dr. Dwyane Dee. Can the order be placed Please. Please advise [phone]

## 2018-06-02 NOTE — Telephone Encounter (Signed)
Okay to schedule lab appt at [facility].

## 2018-06-02 NOTE — Telephone Encounter (Signed)
Will route to ENDO.

## 2018-06-03 NOTE — Telephone Encounter (Signed)
Pt already schedule lab appt/ in Feb. Done

## 2018-06-17 ENCOUNTER — Other Ambulatory Visit (INDEPENDENT_AMBULATORY_CARE_PROVIDER_SITE_OTHER): Payer: PPO

## 2018-06-17 DIAGNOSIS — Z794 Long term (current) use of insulin: Secondary | ICD-10-CM

## 2018-06-17 DIAGNOSIS — E1165 Type 2 diabetes mellitus with hyperglycemia: Secondary | ICD-10-CM

## 2018-06-17 LAB — HEMOGLOBIN A1C: Hgb A1c MFr Bld: 7.1 % — ABNORMAL HIGH (ref 4.6–6.5)

## 2018-06-17 LAB — BASIC METABOLIC PANEL
BUN: 17 mg/dL (ref 6–23)
CO2: 28 meq/L (ref 19–32)
Calcium: 9.5 mg/dL (ref 8.4–10.5)
Chloride: 103 mEq/L (ref 96–112)
Creatinine, Ser: 0.81 mg/dL (ref 0.40–1.50)
GFR: 94.13 mL/min (ref >60.00)
Glucose, Bld: 159 mg/dL — ABNORMAL HIGH (ref 70–99)
Potassium: 4.8 mEq/L (ref 3.5–5.1)
Sodium: 138 mEq/L (ref 135–145)

## 2018-06-24 ENCOUNTER — Encounter: Payer: Self-pay | Admitting: Endocrinology

## 2018-06-24 ENCOUNTER — Ambulatory Visit: Payer: PPO | Admitting: Endocrinology

## 2018-06-24 VITALS — BP 142/72 | HR 64 | Ht 70.0 in | Wt 181.8 lb

## 2018-06-24 DIAGNOSIS — I10 Essential (primary) hypertension: Secondary | ICD-10-CM | POA: Diagnosis not present

## 2018-06-24 DIAGNOSIS — E1165 Type 2 diabetes mellitus with hyperglycemia: Secondary | ICD-10-CM | POA: Diagnosis not present

## 2018-06-24 DIAGNOSIS — Z794 Long term (current) use of insulin: Secondary | ICD-10-CM | POA: Diagnosis not present

## 2018-06-24 DIAGNOSIS — E782 Mixed hyperlipidemia: Secondary | ICD-10-CM | POA: Diagnosis not present

## 2018-06-24 NOTE — Progress Notes (Signed)
Patient ID: Manuel Petty, male   DOB: 01-01-1946, 73 y.o.   MRN: [id number]   Reason for Appointment: follow-up diabetes  History of Present Illness   Diagnosis: Type 2 DIABETES MELITUS, date of diagnosis: 2007   Previous history: He had been on large doses of Lantus before he was given Victoza in addition and this improved his blood sugar control and reduced his insulin requirement However he subsequently started requiring mealtime coverage with large meals also  RECENT history:  Insulin regimen: [employer] brand Regular Insulin  V-go 20 units basal, mealtime boluses breakfast 4-6  units, lunch and supper 6-8 units Non-insulin hypoglycemic drugs: Metformin 1 g 1x a day    A1c still above 7% and is now 7.1, has been as low as 6.5 been stable in the past  Current blood sugar patterns and problems identified:  He again has fairly good blood sugar readings at home from his freestyle libre sensor which was analyzed in detail today after download  However still has periodic POSTPRANDIAL hyperglycemia  Compared to his lab glucose of 159 his blood sugar was fairly close at the same time on his [employer]  He is not appearing to be adjusting his mealtime boluses adequately for the type of meals especially higher carbohydrate meals  Most of his high readings are probably after his first meal which is late morning  Blood sugars after evening meal values to not consistently high, highest 205 late in evening  HYPOGLYCEMIA has been rare and only 1 significant episode possibly from correcting a high reading  On his last visit he was having blood sugars averaging 190 after supper  His weight has been about the same  He has now started going to the [facility] for exercise at least twice a week  He is taking metformin only in the morning since previously would tend to have hypoglycemia overnight with twice a day doses       Side effects from medications: None  Proper  timing of medications in relation to meals: Yes.          Monitors blood glucose:  Several times a day.    Glucometer:  FreeStyle Libre       CGM use % of time  98  2-week average/SD  139+/-25  Time in range        88 %  % Time Above 180  11  % Time above 250   % Time Below 70  1     PRE-MEAL Fasting Lunch Dinner Bedtime Overall  Glucose range:       Averages:  122   132  162  139   POST-MEAL PC Breakfast PC Lunch PC Dinner  Glucose range:     Averages:  154   161      Meals:2- 3 meals per day. Lunch 2-3 pm; dinner 6 pm.  For breakfast he will usually have English muffin and eggs      Dietician visit: Most recent: 08/6501           Complications: are: None     Wt Readings from Last 3 Encounters:  06/24/18 181 lb 12.8 oz (82.5 kg)  02/18/18 179 lb (81.2 kg)  11/19/17 178 lb 9.6 oz (81 kg)      Lab Results  Component Value Date   HGBA1C 7.1 (H) 06/17/2018   HGBA1C 7.1 (H) 02/06/2018   HGBA1C 7.2 (H) 11/12/2017   Lab Results  Component Value Date   MICROALBUR  6.4 (H) 02/06/2018   LDLCALC 84 11/12/2017   CREATININE 0.81 06/17/2018    Other active problems discussed in review of systems   Allergies as of 06/24/2018   No Known Allergies     Medication List       Accurate as of June 24, 2018 10:05 AM. Always use your most recent med list.        aspirin 81 MG tablet Take 81 mg by mouth daily.   diclofenac sodium 1 % Gel Commonly known as:  VOLTAREN Apply 4 g topically 2 (two) times daily as needed.   FREESTYLE LIBRE 14 DAY SENSOR Misc USE AS DIRECTED AND  CHANGE  EVERY  14  DAYS   glucose blood test strip Commonly known as:  FREESTYLE PRECISION NEO TEST USE TO CHECK BLOOD SUGAR UP TO 3 TIMES DAILY.   insulin regular 100 units/mL injection Commonly known as:  NOVOLIN R Inject 0.68 mLs (68 Units total) into the skin daily.   losartan 50 MG tablet Commonly known as:  COZAAR TAKE 1 TABLET BY MOUTH EVERY DAY   metFORMIN 1000 MG tablet Commonly  known as:  GLUCOPHAGE Take 1 tablet by mouth  twice a day with meals   V-GO 20 Kit Use for insulin delivery and change every 24 hours   V-GO 20 Kit USE FOR INSULIN DELIVERY AND CHANGE EVERY 24 HOURS       Allergies: No Known Allergies  Past Medical History:  Diagnosis Date  . Diabetes mellitus ([place]) 2,000  . Erectile dysfunction   . HTN (hypertension)   . Hyperlipidemia   . Personal history of colonic adenomas 07/18/2009  . Renal cancer ([place])    1992, has 1 kidney ; does not see urology any more    Past Surgical History:  Procedure Laterality Date  . COLONOSCOPY    . NEPHRECTOMY  1992    Family History  Problem Relation Age of Onset  . Diabetes Father   . Hypertension Father   . CAD Father        F (MI age 72), Brother in hois 88  . CAD Brother   . Prostate cancer Brother        dx age 68  . Cancer Brother        blood cancer   . Colon cancer Neg Hx   . Stroke Neg Hx     Social History:  reports that he has quit smoking. He has never used smokeless tobacco. He reports current alcohol use of about 2.0 standard drinks of alcohol per week. He reports that he does not use drugs.  Review of Systems -     HYPERLIPIDEMIA:   He has been on Slo-Niacin OTC 500 mg twice a day with adequate control of LDL and triglycerides  HDL has been in the 20s previously along with small LDL particles  Particle number was below 1000 in 06/2014 but still has increased small particles  HDL low and LDL below 100  Lab Results  Component Value Date   CHOL 134 11/12/2017   HDL 31.90 (L) 11/12/2017   LDLCALC 84 11/12/2017   LDLDIRECT 45.7 05/31/2009   TRIG 94.0 11/12/2017   CHOLHDL 4 11/12/2017    He has mild hypertension, now taking LOSARTAN 50 mg instead of lisinopril since his potassium was high previously Blood pressure relatively higher in the office frequently  He monitors BP at home, and blood pressure readings are 115-127/70 range recently   BP Readings from Last 3  Encounters:  06/24/18 (!) 142/72  02/18/18 (!) 146/84  11/19/17 (!) 142/72   HYPERKALEMIA His potassium is normal  He is aware of high potassium foods   Lab Results  Component Value Date   K 4.8 06/17/2018   Eye exams: Annual 11/19   Diabetic foot exam was done in 1/19   Examination:   BP (!) 142/72 (BP Location: Left Arm, Patient Position: Sitting, Cuff Size: Normal)   Pulse 64   Ht _0  (1.778 m)   Wt 181 lb 12.8 oz (82.5 kg)   SpO2 98%   BMI 26.09 kg/m   Body mass index is 26.09 kg/m.    Assesment/Plan:   Diabetes type 2, nonobese: See history of present illness for detailed discussion of his current blood sugar patterns, problems identified   His A1c is about the same at 7.1  Recent blood sugars at home are fairly good Also as discussed above he has some variability in his blood sugars related to type of meals he is having or higher carbohydrate intake sometimes He is not proactively increasing his boluses to cover his higher carbohydrate meals both morning and evening He has gained a little weight but only recently starting to do some exercise His freestyle [employer] and patterns were reviewed with patient Discussed timing of his boluses to be up to 30-minute before eating Also at least with higher carbohydrate meals he probably needs 4 clicks instead of 3  Also discussed possibility of using the Omni pod pump if he does not have consistent availability of the V-go due to production problems Showed him the Omni pod pump and discussed in detail how this would be different  Encouraged him to exercise more often  HYPERTENSION: Blood pressure is  controlled although again appears to have whitecoat syndrome with slightly higher systolic reading  Home readings are showing his blood pressure is well controlled HYPERKALEMIA: Resolved with improving diet  Counseling time on subjects discussed in assessment and plan sections is over 50% of today's 25 minute  visit   There are no Patient Instructions on file for this visit.     Elayne Snare 06/24/2018, 10:05 AM

## 2018-07-08 ENCOUNTER — Other Ambulatory Visit: Payer: Self-pay | Admitting: Endocrinology

## 2018-07-15 ENCOUNTER — Telehealth: Payer: Self-pay

## 2018-07-15 NOTE — Telephone Encounter (Signed)
Called [employer] and initiated a PA for the OmniPod Dash insulin pods, which was approved. Approval is good from 07/15/2018 through 05/14/2019. Reference # is [id number] Fax will be coming through at some point with approval information.

## 2018-07-20 ENCOUNTER — Other Ambulatory Visit: Payer: Self-pay | Admitting: Endocrinology

## 2018-07-22 ENCOUNTER — Telehealth: Payer: Self-pay | Admitting: Nutrition

## 2018-07-22 NOTE — Telephone Encounter (Signed)
I phoned him back after receiving pump start orders from Dr. Dwyane Dee, and talked him through making the changes to his basal rate, changing it from 0.85 to 0.70.  He was told to give R insulin ac meals at 4-6u for breakfast and lunch, and 6-8u for supper.  And that we will set up his pump tomorrow for meal time insulin and other instructions.  He agreed to come in at 11AM.  He was instructed to test his blood sugars ac and HS.  He is wearing the [facility], and was told to bring that with him when he comes tomorrow. He agreed to do this and had no final questions.

## 2018-07-22 NOTE — Telephone Encounter (Signed)
I phoned him to set up an appointment for pump start.  Pt. Said that he started himself on the dash this AM.  (He received it yesterday).  Said his blood sugars were "all over the place.  He is out of V-go 40s and has been on R insulin only for "weeks"

## 2018-07-23 ENCOUNTER — Encounter: Payer: PPO | Attending: Endocrinology | Admitting: Nutrition

## 2018-07-23 ENCOUNTER — Other Ambulatory Visit: Payer: Self-pay

## 2018-07-23 DIAGNOSIS — E1165 Type 2 diabetes mellitus with hyperglycemia: Secondary | ICD-10-CM | POA: Insufficient documentation

## 2018-07-23 NOTE — Progress Notes (Signed)
FBS today was 132.  No lows/ no highs last night.   He was instructed on how to use the dash system.  Carb settings: I/C: 10 ISF: 50, target: 120 with correction over 140, timing 5 hours (on Novolin R).   Reviewed how to give a bolus, and extended bolus, a correction bolus and how to put in carb presets from [employer].  He was also shown how and when to use the temp basal rate, we reveweid sick day guidelines and high blood sugar protocol.  He had no questions.and he signed the checklist as understanding all topics.  He is wearing a [place] and he will scan and use that reading, if no arrows.  We reviewed carb counting also, becaxue he is interested in learning this.  He was given a brochure on this as well as 15 gram portions/list of carbs.  Also handout given on the need for balanced meals.   We reviewed low blood sugars--symptoms and treatments.

## 2018-07-23 NOTE — Patient Instructions (Signed)
Scan before meals and at bedtime

## 2018-07-24 ENCOUNTER — Other Ambulatory Visit: Payer: Self-pay

## 2018-07-24 ENCOUNTER — Ambulatory Visit (INDEPENDENT_AMBULATORY_CARE_PROVIDER_SITE_OTHER): Payer: PPO | Admitting: Endocrinology

## 2018-07-24 ENCOUNTER — Ambulatory Visit: Payer: PPO | Admitting: Endocrinology

## 2018-07-24 ENCOUNTER — Encounter: Payer: Self-pay | Admitting: Endocrinology

## 2018-07-24 VITALS — Ht 70.0 in

## 2018-07-24 DIAGNOSIS — E1165 Type 2 diabetes mellitus with hyperglycemia: Secondary | ICD-10-CM | POA: Diagnosis not present

## 2018-07-24 DIAGNOSIS — Z794 Long term (current) use of insulin: Secondary | ICD-10-CM | POA: Diagnosis not present

## 2018-07-24 NOTE — Progress Notes (Signed)
Patient ID: Manuel Petty, male   DOB: [birth year], 73 y.o.   MRN: [id number]   Reason for Appointment: follow-up diabetes  History of Present Illness   Diagnosis: Type 2 DIABETES MELITUS, date of diagnosis: 2007   Previous history: He had been on large doses of Lantus before he was given Victoza in addition and this improved his blood sugar control and reduced his insulin requirement However he subsequently started requiring mealtime coverage with large meals also  RECENT history:  Insulin regimen: [employer] brand Regular Insulin  OMNI pod insulin pump, basal rate 0.7  Recommended boluses breakfast 4-6  units, lunch and supper 6-8 units  Non-insulin hypoglycemic drugs: Metformin 1 g 1x a day    A1c is last 7.1, has been as low as 6.5 been stable in the past  Current blood sugar patterns and problems identified:  He started using the Omni pod insulin pump 2 days ago  He started this on his own without any instructions or guidance  His management was reviewed by diabetes educator yesterday but he is confused about how to do his boluses and entering carbohydrates  Currently monitoring blood sugars with freestyle libre and this may be relatively lower than fingerstick readings including this morning  However checking blood sugar numerous times during the day lately  Prior to the pump he had a low sugar of 60 and 52 during the night with the V-go pump  More recently with the new pump his fasting readings have been about 130-140  Yesterday at lunchtime he did not bolus and blood sugar went up to 229  Last evening with only a small amount of carbohydrate his blood sugar highest was only 146  Today appears to have higher sugars midmorning even though he had only eggs and lean meat for breakfast without any carbohydrate and bolused 2.5 units  No hypoglycemia       Side effects from medications: None  Proper timing of medications in relation to meals: Yes.        Monitors blood glucose:  Several times a day.    Glucometer:  FreeStyle Libre       Blood sugar readings as above    Meals:2- 3 meals per day. Lunch 2-3 pm; dinner 6 pm.  For breakfast he will sometimes have English muffin and eggs      Dietician visit: Most recent: 06/6946           Complications: are: None     Wt Readings from Last 3 Encounters:  06/24/18 181 lb 12.8 oz (82.5 kg)  02/18/18 179 lb (81.2 kg)  11/19/17 178 lb 9.6 oz (81 kg)      Lab Results  Component Value Date   HGBA1C 7.1 (H) 06/17/2018   HGBA1C 7.1 (H) 02/06/2018   HGBA1C 7.2 (H) 11/12/2017   Lab Results  Component Value Date   MICROALBUR 6.4 (H) 02/06/2018   LDLCALC 84 11/12/2017   CREATININE 0.81 06/17/2018    Other active problems discussed in review of systems   Allergies as of 07/24/2018   No Known Allergies     Medication List       Accurate as of July 24, 2018 11:25 AM. Always use your most recent med list.        aspirin 81 MG tablet Take 81 mg by mouth daily.   diclofenac sodium 1 % Gel Commonly known as:  VOLTAREN Apply 4 g topically 2 (two) times daily as needed.   FreeStyle  Libre 14 Day Sensor Misc USE AS DIRECTED AND  CHANGE  EVERY  14  DAYS   glucose blood test strip Commonly known as:  FreeStyle Precision Neo Test USE TO CHECK BLOOD SUGAR UP TO 3 TIMES DAILY.   insulin regular 100 units/mL injection Commonly known as:  NovoLIN R Inject 0.68 mLs (68 Units total) into the skin daily.   losartan 50 MG tablet Commonly known as:  COZAAR TAKE 1 TABLET BY MOUTH EVERY DAY   metFORMIN 1000 MG tablet Commonly known as:  GLUCOPHAGE TAKE 1 TABLET BY MOUTH ONCE DAILY.   V-Go 20 Kit Use for insulin delivery and change every 24 hours   V-Go 20 Kit USE FOR INSULIN DELIVERY AND CHANGE EVERY 24 HOURS       Allergies: No Known Allergies  Past Medical History:  Diagnosis Date  . Diabetes mellitus ([place]) 2,000  . Erectile dysfunction   . HTN (hypertension)   .  Hyperlipidemia   . Personal history of colonic adenomas 07/18/2009  . Renal cancer ([place])    1992, has 1 kidney ; does not see urology any more    Past Surgical History:  Procedure Laterality Date  . COLONOSCOPY    . NEPHRECTOMY  1992    Family History  Problem Relation Age of Onset  . Diabetes Father   . Hypertension Father   . CAD Father        F (MI age 38), Brother in hois 29  . CAD Brother   . Prostate cancer Brother        dx age 82  . Cancer Brother        blood cancer   . Colon cancer Neg Hx   . Stroke Neg Hx     Social History:  reports that he has quit smoking. He has never used smokeless tobacco. He reports current alcohol use of about 2.0 standard drinks of alcohol per week. He reports that he does not use drugs.  Review of Systems -    The following is a copy of the previous note:  HYPERLIPIDEMIA:   He has been on Slo-Niacin OTC 500 mg twice a day with adequate control of LDL and triglycerides  HDL has been in the 20s previously along with small LDL particles  Particle number was below 1000 in 06/2014 but still has increased small particles  HDL low and LDL below 100  Lab Results  Component Value Date   CHOL 134 11/12/2017   HDL 31.90 (L) 11/12/2017   LDLCALC 84 11/12/2017   LDLDIRECT 45.7 05/31/2009   TRIG 94.0 11/12/2017   CHOLHDL 4 11/12/2017    He has mild hypertension, now taking LOSARTAN 50 mg instead of lisinopril since his potassium was high previously Blood pressure relatively higher in the office frequently  He monitors BP at home, and blood pressure readings are 115-127/70 range recently   BP Readings from Last 3 Encounters:  06/24/18 (!) 142/72  02/18/18 (!) 146/84  11/19/17 (!) 142/72   HYPERKALEMIA His potassium is normal  He is aware of high potassium foods   Lab Results  Component Value Date   K 4.8 06/17/2018   Eye exams: Annual 11/19   Diabetic foot exam was done in 1/19   Examination:   Ht 5' 10"  (1.778 m)    BMI 26.09 kg/m   Body mass index is 26.09 kg/m.    Assesment/Plan:   Diabetes type 2, nonobese: See history of present illness for detailed discussion of  his current blood sugar patterns, problems identified   He is here for monitoring his diabetes related to starting the Omni pod insulin pump about 2 days ago Currently on a basal rate of 0.7 which is lower than what he was using on the V-go pump but previously was getting low sugars overnight at times Blood sugars are excellent overall although with freestyle libre he may be having relatively lower readings than actual blood sugars He does appear to have slightly higher readings in the mornings at least until 11 AM but no significant postprandial rise in blood sugar yesterday May also appear to be needing some extra insulin if eating higher fat foods like eggs  His new settings will be 0.75 basal rate from midnight until 11 AM and no change in the 0.7 basal rate the rest of the day Showed him how to make the changes Discussed day-to-day management with the pump, blood sugar targets and correlation of fingersticks with freestyle libre Also discussed that if he is planning to do significant physical activity he can use a temporary basal of around 30 to 50% and showed him how to do this  Advised him to use preset boluses of 2-4 units for his meals and up to 6 units if eating more carbohydrate To be reviewed again tomorrow  There are no Patient Instructions on file for this visit.  Counseling time on subjects discussed in assessment and plan sections is over 50% of today's 25 minute visit     Elayne Snare 07/24/2018, 11:25 AM

## 2018-07-25 ENCOUNTER — Ambulatory Visit (INDEPENDENT_AMBULATORY_CARE_PROVIDER_SITE_OTHER): Payer: PPO | Admitting: Endocrinology

## 2018-07-25 ENCOUNTER — Other Ambulatory Visit: Payer: Self-pay

## 2018-07-25 ENCOUNTER — Encounter: Payer: Self-pay | Admitting: Endocrinology

## 2018-07-25 DIAGNOSIS — Z794 Long term (current) use of insulin: Secondary | ICD-10-CM

## 2018-07-25 DIAGNOSIS — E1165 Type 2 diabetes mellitus with hyperglycemia: Secondary | ICD-10-CM

## 2018-07-25 NOTE — Progress Notes (Signed)
Patient ID: Manuel Petty, male   DOB: 06/17/1945, 73 y.o.   MRN: [id number]   Reason for Appointment: follow-up diabetes  History of Present Illness   Diagnosis: Type 2 DIABETES MELITUS, date of diagnosis: 2007   Previous history: He had been on large doses of Lantus before he was given Victoza in addition and this improved his blood sugar control and reduced his insulin requirement However he subsequently started requiring mealtime coverage with large meals also  RECENT history:  Insulin regimen: [employer] brand Regular Insulin  OMNI pod insulin pump, basal rate 0.7  Recommended boluses breakfast 4-6  units, lunch and supper 6-8 units  Non-insulin hypoglycemic drugs: Metformin 1 g 1x a day    A1c is last 7.1, has been as low as 6.5 been stable in the past  Current blood sugar patterns and problems identified:  He started using the Omni pod insulin pump 3 days ago  He was told to change his basal rate to 0.75 at midnight until 11 AM and continue 0.7 the rest of the day  With this his fasting blood sugar was better at 120 today  However even with not eating any carbohydrate at breakfast and 1 unit bolus his blood sugar went up to about 168 this morning  His blood sugar was as low as 72 yesterday afternoon but he was physically active and he did not use a temporary basal  No hypoglycemia  Last evening right after he had his low normal blood sugar with exercise he had relatively large meals with carbohydrate and entered 60 and 80 g respectively for carbohydrate and blood sugar was fairly good until about 10:30 PM when it was 166, however he did have a relatively higher fat meal  Blood sugars through the night were relatively better  However his freestyle libre sensor readings lower than his actual blood sugars by at least 10-15 mg and he makes an adjustment for his boluses       Side effects from medications: None  Proper timing of medications in relation  to meals: Yes.          Monitors blood glucose:  Several times a day.    Glucometer:  FreeStyle Libre       Blood sugar readings as above    Meals:2- 3 meals per day. Lunch 2-3 pm; dinner 6 pm.  For breakfast he will sometimes have English muffin and eggs otherwise only [place] bacon and egg beaters      Dietician visit: Most recent: 08/6960           Complications: are: None     Wt Readings from Last 3 Encounters:  06/24/18 181 lb 12.8 oz (82.5 kg)  02/18/18 179 lb (81.2 kg)  11/19/17 178 lb 9.6 oz (81 kg)      Lab Results  Component Value Date   HGBA1C 7.1 (H) 06/17/2018   HGBA1C 7.1 (H) 02/06/2018   HGBA1C 7.2 (H) 11/12/2017   Lab Results  Component Value Date   MICROALBUR 6.4 (H) 02/06/2018   LDLCALC 84 11/12/2017   CREATININE 0.81 06/17/2018    Other active problems discussed in review of systems   Allergies as of 07/25/2018   No Known Allergies     Medication List       Accurate as of July 25, 2018 11:59 PM. Always use your most recent med list.        aspirin 81 MG tablet Take 81 mg by mouth daily.  diclofenac sodium 1 % Gel Commonly known as:  VOLTAREN Apply 4 g topically 2 (two) times daily as needed.   FreeStyle Libre 14 Day Sensor Misc USE AS DIRECTED AND  CHANGE  EVERY  14  DAYS   glucose blood test strip Commonly known as:  FreeStyle Precision Neo Test USE TO CHECK BLOOD SUGAR UP TO 3 TIMES DAILY.   insulin regular 100 units/mL injection Commonly known as:  NovoLIN R Inject 0.68 mLs (68 Units total) into the skin daily.   losartan 50 MG tablet Commonly known as:  COZAAR TAKE 1 TABLET BY MOUTH EVERY DAY   metFORMIN 1000 MG tablet Commonly known as:  GLUCOPHAGE TAKE 1 TABLET BY MOUTH ONCE DAILY.   V-Go 20 Kit Use for insulin delivery and change every 24 hours   V-Go 20 Kit USE FOR INSULIN DELIVERY AND CHANGE EVERY 24 HOURS       Allergies: No Known Allergies  Past Medical History:  Diagnosis Date  . Diabetes mellitus ([place])  2,000  . Erectile dysfunction   . HTN (hypertension)   . Hyperlipidemia   . Personal history of colonic adenomas 07/18/2009  . Renal cancer ([place])    1992, has 1 kidney ; does not see urology any more    Past Surgical History:  Procedure Laterality Date  . COLONOSCOPY    . NEPHRECTOMY  1992    Family History  Problem Relation Age of Onset  . Diabetes Father   . Hypertension Father   . CAD Father        F (MI age 19), Brother in ho[place] 50  . CAD Brother   . Prostate cancer Brother        dx age 66  . Cancer Brother        blood cancer   . Colon cancer Neg Hx   . Stroke Neg Hx     Social History:  reports that he has quit smoking. He has never used smokeless tobacco. He reports current alcohol use of about 2.0 standard drinks of alcohol per week. He reports that he does not use drugs.  Review of Systems -    The following is a copy of the previous note:  HYPERLIPIDEMIA:   He has been on Slo-Niacin OTC 500 mg twice a day with adequate control of LDL and triglycerides  HDL has been in the 20s previously along with small LDL particles  Particle number was below 1000 in 06/2014 but still has increased small particles  HDL low and LDL below 100  Lab Results  Component Value Date   CHOL 134 11/12/2017   HDL 31.90 (L) 11/12/2017   LDLCALC 84 11/12/2017   LDLDIRECT 45.7 05/31/2009   TRIG 94.0 11/12/2017   CHOLHDL 4 11/12/2017    He has mild hypertension, now taking LOSARTAN 50 mg instead of lisinopril since his potassium was high previously Blood pressure relatively higher in the office frequently  He monitors BP at home, and blood pressure readings are 115-127/70 range recently   BP Readings from Last 3 Encounters:  06/24/18 (!) 142/72  02/18/18 (!) 146/84  11/19/17 (!) 142/72   HYPERKALEMIA His potassium is normal  He is aware of high potassium foods   Lab Results  Component Value Date   K 4.8 06/17/2018   Eye exams: Annual 11/19   Diabetic foot exam was  done in 1/19   Examination:   There were no vitals taken for this visit.  There is no height or  weight on file to calculate BMI.    Assesment/Plan:   Diabetes type 2, nonobese: See history of present illness for detailed discussion of his current blood sugar patterns, problems identified   He is here for monitoring his diabetes related to starting the Omni pod insulin pump about 2 days ago Currently on a basal rate of 0.7 which is lower than what he was using on the V-go pump but previously was getting low sugars overnight at times Blood sugars are excellent overall although with freestyle libre he may be having relatively lower readings than actual blood sugars He does appear to have slightly higher readings in the mornings at least until 11 AM but no significant postprandial rise in blood sugar yesterday May also appear to be needing some extra insulin if eating higher fat foods like eggs  His new settings will be 0.75 basal rate from midnight until 11 AM and no change in the 0.7 basal rate the rest of the day Showed him how to make the changes Discussed day-to-day management with the pump, blood sugar targets and correlation of fingersticks with freestyle libre Also discussed that if he is planning to do significant physical activity he can use a temporary basal of around 30 to 50% and showed him how to do this  Advised him to use preset boluses of 2-4 units for his meals and up to 6 units if eating more carbohydrate To be reviewed again tomorrow  There are no Patient Instructions on file for this visit.  Counseling time on subjects discussed in assessment and plan sections is over 50% of today's 25 minute visit     Elayne Snare 07/26/2018, 2:54 PM                       Patient ID: Manuel Petty, male   DOB: December 31, 1945, 73 y.o.   MRN: [id number]   Reason for Appointment: follow-up diabetes  History of Present Illness   Diagnosis: Type 2 DIABETES MELITUS,  date of diagnosis: 2007   Previous history: He had been on large doses of Lantus before he was given Victoza in addition and this improved his blood sugar control and reduced his insulin requirement However he subsequently started requiring mealtime coverage with large meals also  RECENT history:  Insulin regimen: [employer] brand Regular Insulin  OMNI pod insulin pump, basal rate 0.7  Recommended boluses breakfast 4-6  units, lunch and supper 6-8 units  Non-insulin hypoglycemic drugs: Metformin 1 g 1x a day    A1c is last 7.1, has been as low as 6.5 been stable in the past  Current blood sugar patterns and problems identified:  He started using the Omni pod insulin pump 2 days ago  He started this on his own without any instructions or guidance  His management was reviewed by diabetes educator yesterday but he is confused about how to do his boluses and entering carbohydrates  Currently monitoring blood sugars with freestyle libre and this may be relatively lower than fingerstick readings including this morning  However checking blood sugar numerous times during the day lately  Prior to the pump he had a low sugar of 60 and 52 during the night with the V-go pump  More recently with the new pump his fasting readings have been about 130-140  Yesterday at lunchtime he did not bolus and blood sugar went up to 229  Last evening with only a small amount of carbohydrate his blood sugar highest was  only 146  Today appears to have higher sugars midmorning even though he had only eggs and lean meat for breakfast without any carbohydrate and bolused 2.5 units  No hypoglycemia       Side effects from medications: None  Proper timing of medications in relation to meals: Yes.          Monitors blood glucose:  Several times a day.    Glucometer:  FreeStyle Libre       Blood sugar readings as above    Meals:2- 3 meals per day. Lunch 2-3 pm; dinner 6 pm.  For breakfast he will  sometimes have English muffin and eggs      Dietician visit: Most recent: 05/6107           Complications: are: None     Wt Readings from Last 3 Encounters:  06/24/18 181 lb 12.8 oz (82.5 kg)  02/18/18 179 lb (81.2 kg)  11/19/17 178 lb 9.6 oz (81 kg)      Lab Results  Component Value Date   HGBA1C 7.1 (H) 06/17/2018   HGBA1C 7.1 (H) 02/06/2018   HGBA1C 7.2 (H) 11/12/2017   Lab Results  Component Value Date   MICROALBUR 6.4 (H) 02/06/2018   LDLCALC 84 11/12/2017   CREATININE 0.81 06/17/2018    Other active problems discussed in review of systems   Allergies as of 07/25/2018   No Known Allergies     Medication List       Accurate as of July 25, 2018 11:59 PM. Always use your most recent med list.        aspirin 81 MG tablet Take 81 mg by mouth daily.   diclofenac sodium 1 % Gel Commonly known as:  VOLTAREN Apply 4 g topically 2 (two) times daily as needed.   FreeStyle Libre 14 Day Sensor Misc USE AS DIRECTED AND  CHANGE  EVERY  14  DAYS   glucose blood test strip Commonly known as:  FreeStyle Precision Neo Test USE TO CHECK BLOOD SUGAR UP TO 3 TIMES DAILY.   insulin regular 100 units/mL injection Commonly known as:  NovoLIN R Inject 0.68 mLs (68 Units total) into the skin daily.   losartan 50 MG tablet Commonly known as:  COZAAR TAKE 1 TABLET BY MOUTH EVERY DAY   metFORMIN 1000 MG tablet Commonly known as:  GLUCOPHAGE TAKE 1 TABLET BY MOUTH ONCE DAILY.   V-Go 20 Kit Use for insulin delivery and change every 24 hours   V-Go 20 Kit USE FOR INSULIN DELIVERY AND CHANGE EVERY 24 HOURS       Allergies: No Known Allergies  Past Medical History:  Diagnosis Date  . Diabetes mellitus ([place]) 2,000  . Erectile dysfunction   . HTN (hypertension)   . Hyperlipidemia   . Personal history of colonic adenomas 07/18/2009  . Renal cancer ([place])    1992, has 1 kidney ; does not see urology any more    Past Surgical History:  Procedure Laterality Date  .  COLONOSCOPY    . NEPHRECTOMY  1992    Family History  Problem Relation Age of Onset  . Diabetes Father   . Hypertension Father   . CAD Father        F (MI age 76), Brother in [place] 44  . CAD Brother   . Prostate cancer Brother        dx age 64  . Cancer Brother        blood cancer   .  Colon cancer Neg Hx   . Stroke Neg Hx     Social History:  reports that he has quit smoking. He has never used smokeless tobacco. He reports current alcohol use of about 2.0 standard drinks of alcohol per week. He reports that he does not use drugs.  Review of Systems -    The following is a copy of the previous note:  HYPERLIPIDEMIA:   He has been on Slo-Niacin OTC 500 mg twice a day with adequate control of LDL and triglycerides  HDL has been in the 20s previously along with small LDL particles  Particle number was below 1000 in 06/2014 but still has increased small particles  HDL low and LDL below 100  Lab Results  Component Value Date   CHOL 134 11/12/2017   HDL 31.90 (L) 11/12/2017   LDLCALC 84 11/12/2017   LDLDIRECT 45.7 05/31/2009   TRIG 94.0 11/12/2017   CHOLHDL 4 11/12/2017    He has mild hypertension, now taking LOSARTAN 50 mg instead of lisinopril since his potassium was high previously Blood pressure relatively higher in the office frequently  He monitors BP at home, and blood pressure readings are 115-127/70 range recently   BP Readings from Last 3 Encounters:  06/24/18 (!) 142/72  02/18/18 (!) 146/84  11/19/17 (!) 142/72   HYPERKALEMIA His potassium is normal  He is aware of high potassium foods   Lab Results  Component Value Date   K 4.8 06/17/2018   Eye exams: Annual 11/19   Diabetic foot exam was done in 1/19   Examination:   There were no vitals taken for this visit.  There is no height or weight on file to calculate BMI.    Assesment/Plan:   Diabetes type 2, nonobese: See history of present illness for detailed discussion of his current  management, blood sugar patterns, problems identified   Today he is on his third day for the new Omni pod insulin pump start  Blood sugars are generally fairly good now with slightly higher basal rate between midnight-11 AM He did however have low normal blood sugars with excessive yard work yesterday and he did not make any compensation for his basal rate Reminded him how to use a temporary basal for exercise Also is not using an extended bolus for higher fat meal that he had last night Also discussed that he may need to cover his breakfast with around 2 units even with low carbohydrate since he has some fat and also has coffee which will raise her blood sugar Reminded him to bolus before starting to eat Follow-up as previously scheduled  There are no Patient Instructions on file for this visit.       Elayne Snare 07/26/2018, 2:54 PM

## 2018-07-30 ENCOUNTER — Telehealth: Payer: Self-pay | Admitting: Nutrition

## 2018-07-30 NOTE — Telephone Encounter (Signed)
Patient reported no difficulties with using his pump--no problems with bolusing, or wearing, or changing his pod.  Says no low blood sugars, and the average is 130 for the last 7 days.  IS very pleased with this device and had no questions for me.

## 2018-07-30 NOTE — Telephone Encounter (Signed)
Message left on voice mail to call me to let me know how he is doing on his pump

## 2018-10-03 ENCOUNTER — Other Ambulatory Visit: Payer: Self-pay | Admitting: Endocrinology

## 2018-10-08 ENCOUNTER — Encounter: Payer: Self-pay | Admitting: Internal Medicine

## 2018-10-21 ENCOUNTER — Other Ambulatory Visit (INDEPENDENT_AMBULATORY_CARE_PROVIDER_SITE_OTHER): Payer: PPO

## 2018-10-21 ENCOUNTER — Telehealth: Payer: Self-pay | Admitting: Endocrinology

## 2018-10-21 ENCOUNTER — Encounter: Payer: Self-pay | Admitting: Endocrinology

## 2018-10-21 ENCOUNTER — Ambulatory Visit (INDEPENDENT_AMBULATORY_CARE_PROVIDER_SITE_OTHER): Payer: PPO | Admitting: Endocrinology

## 2018-10-21 ENCOUNTER — Other Ambulatory Visit: Payer: Self-pay

## 2018-10-21 VITALS — BP 142/72 | HR 74 | Ht 70.0 in | Wt 179.0 lb

## 2018-10-21 DIAGNOSIS — E1165 Type 2 diabetes mellitus with hyperglycemia: Secondary | ICD-10-CM | POA: Diagnosis not present

## 2018-10-21 DIAGNOSIS — Z794 Long term (current) use of insulin: Secondary | ICD-10-CM | POA: Diagnosis not present

## 2018-10-21 DIAGNOSIS — E782 Mixed hyperlipidemia: Secondary | ICD-10-CM | POA: Diagnosis not present

## 2018-10-21 LAB — COMPREHENSIVE METABOLIC PANEL
ALT: 38 U/L (ref 0–53)
AST: 25 U/L (ref 0–37)
Albumin: 4.4 g/dL (ref 3.5–5.2)
Alkaline Phosphatase: 61 U/L (ref 39–117)
BUN: 15 mg/dL (ref 6–23)
CO2: 29 mEq/L (ref 19–32)
Calcium: 8.9 mg/dL (ref 8.4–10.5)
Chloride: 105 mEq/L (ref 96–112)
Creatinine, Ser: 0.81 mg/dL (ref 0.40–1.50)
GFR: 94.03 mL/min (ref >60.00)
Glucose, Bld: 169 mg/dL — ABNORMAL HIGH (ref 70–99)
Potassium: 4.4 mEq/L (ref 3.5–5.1)
Sodium: 140 mEq/L (ref 135–145)
Total Bilirubin: 0.5 mg/dL (ref 0.2–1.2)
Total Protein: 6.8 g/dL (ref 6.0–8.3)

## 2018-10-21 LAB — LIPID PANEL
Cholesterol: 125 mg/dL (ref 0–200)
HDL: 26.8 mg/dL — ABNORMAL LOW (ref >39.00)
LDL Cholesterol: 78 mg/dL (ref 0–99)
NonHDL: 97.72
Total CHOL/HDL Ratio: 5
Triglycerides: 101 mg/dL (ref 0.0–149.0)
VLDL: 20.2 mg/dL (ref 0.0–40.0)

## 2018-10-21 LAB — HEMOGLOBIN A1C: Hgb A1c MFr Bld: 7.4 % — ABNORMAL HIGH (ref 4.6–6.5)

## 2018-10-21 MED ORDER — PRAVASTATIN SODIUM 20 MG PO TABS: 20.0000 mg | ORAL_TABLET | Freq: Every day | ORAL | 3 refills | Status: DC

## 2018-10-21 NOTE — Progress Notes (Signed)
Patient ID: Manuel Petty, male   DOB: [birth year], 73 y.o.   MRN: [id number]   Reason for Appointment: follow-up diabetes  History of Present Illness   Diagnosis: Type 2 DIABETES MELITUS, date of diagnosis: 2007   Previous history: He had been on large doses of Lantus before he was given Victoza in addition and this improved his blood sugar control and reduced his insulin requirement However he subsequently started requiring mealtime coverage with large meals also  RECENT history:  Insulin regimen: [employer] brand Regular Insulin  OMNI pod insulin pump, basal rate 0. 8 5 midnight-11 AM and 11 AM = 0.75  Recommended boluses breakfast 4-6  units, lunch and supper 6-8 units  Non-insulin hypoglycemic drugs: Metformin 1 g with first meal  A1c is last 7.1, has been as low as 6.5 and stable in the past, pending from today  Current blood sugar patterns and problems identified:  He started using the Omni pod insulin pump 3 months ago  He appears to be adjusting his basal rate on a daily basis, taking more insulin in the evenings and also adjusting his basal rate at midnight based on his bedtime blood sugar  However his total basal rate is usually about 19 units a day  Also he will take as much as 6 units bolus late in the evening if he has higher blood sugars, this would rarely cause low normal blood sugars overnight  Blood sugar analysis of his freestyle libre as discussed below  He is a little more active in the afternoon  During the day no hypoglycemia  As before his freestyle Elenor Legato is reading slightly lower than his actual blood sugar  Is not entering his carbohydrate amounts or actual blood sugars to do his boluses       Side effects from medications: None  Proper timing of medications in relation to meals: Yes.          Monitors blood glucose:  Several times a day.    Glucometer:  FreeStyle Libre        CONTINUOUS GLUCOSE MONITORING RECORD  INTERPRETATION    Dates of Recording: 5/27 through 10/21/2018  Sensor description: [employer]  Results statistics:   CGM use % of time  96  Average and SD  137  Time in range       89 %  % Time Above 180  10  % Time above 250   % Time Below target  1    Glycemic patterns summary: Blood sugars are generally not fluctuating excessively His lowest blood sugars are at 4-6 AM and highest late evening especially after 8 PM Blood sugars tend to be gradually rising during the day and lower before dinnertime Has minimal hypoglycemia  Hyperglycemic episodes he has no significant spikes in his blood sugars except on 4 5 the evenings after dinner with blood sugars 180-200+ Rarely blood sugars are higher after lunch  Hypoglycemic episodes only last night his blood sugar was mildly decreased around 3-4 AM, asymptomatic  Overnight periods: His blood sugars are generally starting up higher at midnight averaging 157 and then gradually decreasing with the lowest blood sugars between 4-6 AM averaging 106  Preprandial periods: Blood sugars are mildly increased at breakfast and lunch although not always having a bolus for breakfast  Postprandial periods:    After lunch:   No significant rise in blood sugar usually After dinner: Blood sugars are variable with periodic rise over 180 but less than half  the time   PRE-MEAL Fasting Lunch Dinner Bedtime Overall  Glucose range:       Mean/median:  115  135  128  157  137   POST-MEAL PC Breakfast PC Lunch PC Dinner  Glucose range:     Mean/median:   154  170        Meals:2- 3 meals per day. Lunch 2-3 pm; dinner 6 pm.  For breakfast he will sometimes have English muffin and eggs otherwise only [place] bacon and egg beaters      Dietician visit: Most recent: 05/6604           Complications: are: None     Wt Readings from Last 3 Encounters:  10/21/18 179 lb (81.2 kg)  06/24/18 181 lb 12.8 oz (82.5 kg)  02/18/18 179 lb (81.2 kg)      Lab  Results  Component Value Date   HGBA1C 7.4 (H) 10/21/2018   HGBA1C 7.1 (H) 06/17/2018   HGBA1C 7.1 (H) 02/06/2018   Lab Results  Component Value Date   MICROALBUR 6.4 (H) 02/06/2018   LDLCALC 78 10/21/2018   CREATININE 0.81 10/21/2018    Other active problems discussed in review of systems   Allergies as of 10/21/2018   No Known Allergies     Medication List       Accurate as of October 21, 2018  9:16 PM. If you have any questions, ask your nurse or doctor.        aspirin 81 MG tablet Take 81 mg by mouth daily.   diclofenac sodium 1 % Gel Commonly known as:  VOLTAREN Apply 4 g topically 2 (two) times daily as needed.   FreeStyle Libre 14 Day Sensor Misc USE AS DIRECTED AND  CHANGE  EVERY  14  DAYS   glucose blood test strip Commonly known as:  FreeStyle Precision Neo Test USE TO CHECK BLOOD SUGAR UP TO 3 TIMES DAILY.   insulin regular 100 units/mL injection Commonly known as:  NovoLIN R Inject 0.68 mLs (68 Units total) into the skin daily.   losartan 50 MG tablet Commonly known as:  COZAAR TAKE 1 TABLET BY MOUTH EVERY DAY   metFORMIN 1000 MG tablet Commonly known as:  GLUCOPHAGE TAKE 1 TABLET BY MOUTH ONCE DAILY.   OmniPod Dash System Kit 1 each by Other route See admin instructions. Use continuously to administer insulin. What changed:  Another medication with the same name was removed. Continue taking this medication, and follow the directions you see here. Changed by:  Elayne Snare, MD   OmniPod Dash 5 Pack Pods Misc 1 each by Other route See admin instructions. Apply 1 pod to body once every 3 days to administer insulin. What changed:  Another medication with the same name was removed. Continue taking this medication, and follow the directions you see here. Changed by:  Elayne Snare, MD       Allergies: No Known Allergies  Past Medical History:  Diagnosis Date  . Diabetes mellitus ([place]) 2,000  . Erectile dysfunction   . HTN (hypertension)   .  Hyperlipidemia   . Personal history of colonic adenomas 07/18/2009  . Renal cancer ([place])    1992, has 1 kidney ; does not see urology any more    Past Surgical History:  Procedure Laterality Date  . COLONOSCOPY    . NEPHRECTOMY  1992    Family History  Problem Relation Age of Onset  . Diabetes Father   . Hypertension Father   .  CAD Father        F (MI age 4), Brother in ho[place] 17  . CAD Brother   . Prostate cancer Brother        dx age 3  . Cancer Brother        blood cancer   . Colon cancer Neg Hx   . Stroke Neg Hx     Social History:  reports that he has quit smoking. He has never used smokeless tobacco. He reports current alcohol use of about 2.0 standard drinks of alcohol per week. He reports that he does not use drugs.  Review of Systems -      HYPERLIPIDEMIA:   He previously had been on Slo-Niacin OTC 500 mg twice a day with adequate control of LDL and triglycerides  HDL has been in the 20s previously along with small LDL particles  Particle number was below 1000 in 06/2014 but still has increased small particles  On his own for several months he has not taken any niacin He thinks several years ago he was on a statin drug but does not know which one  Recent lab not available  Lab Results  Component Value Date   CHOL 125 10/21/2018   HDL 26.80 (L) 10/21/2018   LDLCALC 78 10/21/2018   LDLDIRECT 45.7 05/31/2009   TRIG 101.0 10/21/2018   CHOLHDL 5 10/21/2018    He has mild hypertension, taking LOSARTAN 50 mg instead of lisinopril since his potassium was high previously Blood pressure relatively higher in the office usually  He monitors BP at home, and blood pressure readings are 038+ systolic range and diastolic usually below 80   BP Readings from Last 3 Encounters:  10/21/18 (!) 142/72  06/24/18 (!) 142/72  02/18/18 (!) 146/84   HYPERKALEMIA Not a recent problem  Lab Results  Component Value Date   K 4.4 10/21/2018   Eye exams: Annual 11/19    Diabetic foot exam was done in 1/19   Examination:   BP (!) 142/72 (BP Location: Left Arm, Patient Position: Sitting, Cuff Size: Normal)   Pulse 74   Ht _0  (1.778 m)   Wt 179 lb (81.2 kg)   SpO2 95%   BMI 25.68 kg/m   Body mass index is 25.68 kg/m.    Assesment/Plan:   Diabetes type 2, nonobese: See history of present illness for detailed discussion of his current blood sugar patterns, problems identified   A1c is pending  With his Omni pod insulin pump and freestyle libre he is usually managing blood sugars fairly well Freestyle libre tends to read relatively lower and he is not always monitoring with fingerstick Problems identified as discussed above  Recommendations:  He will use a standard basal rate did not change it from day-to-day  Blood sugars will be adjusted based on his pre-meal blood sugar and he will need to put in his blood sugar reading for correction boluses and not take arbitrarily 4 to 6 units  Increase bolus at dinnertime by 2 units  To bolus 30-minute before eating  New basal settings: Midnight = 0.90, 4 AM = 0.8, 8 AM = 0.9, 3 PM = 0.75 and 6 PM = 0.9  Discussed benefits of statin drugs and because of his strong family history will need to be on a statin drug based on his labs today  There are no Patient Instructions on file for this visit.  Counseling time on subjects discussed in assessment and plan sections is over 50% of  today's 25 minute visit     Elayne Snare 10/21/2018, 9:16 PM   Pravastatin 20 mg prescribed  Lab on 10/21/2018  Component Date Value Ref Range Status  . Cholesterol 10/21/2018 125  0 - 200 mg/dL Final   ATP III Classification       Desirable:  < 200 mg/dL               Borderline High:  200 - 239 mg/dL          High:  > = 240 mg/dL  . Triglycerides 10/21/2018 101.0  0.0 - 149.0 mg/dL Final   Normal:  <150 mg/dLBorderline High:  150 - 199 mg/dL  . HDL 10/21/2018 26.80* >39.00 mg/dL Final  . VLDL 10/21/2018 20.2  0.0 -  40.0 mg/dL Final  . LDL Cholesterol 10/21/2018 78  0 - 99 mg/dL Final  . Total CHOL/HDL Ratio 10/21/2018 5   Final                  Men          Women1/2 Average Risk     3.4          3.3Average Risk          5.0          4.42X Average Risk          9.6          7.13X Average Risk          15.0          11.0                      . NonHDL 10/21/2018 97.72   Final   NOTE:  Non-HDL goal should be 30 mg/dL higher than patient's LDL goal (i.e. LDL goal of < 70 mg/dL, would have non-HDL goal of < 100 mg/dL)  . Sodium 10/21/2018 140  135 - 145 mEq/L Final  . Potassium 10/21/2018 4.4  3.5 - 5.1 mEq/L Final  . Chloride 10/21/2018 105  96 - 112 mEq/L Final  . CO2 10/21/2018 29  19 - 32 mEq/L Final  . Glucose, Bld 10/21/2018 169* 70 - 99 mg/dL Final  . BUN 10/21/2018 15  6 - 23 mg/dL Final  . Creatinine, Ser 10/21/2018 0.81  0.40 - 1.50 mg/dL Final  . Total Bilirubin 10/21/2018 0.5  0.2 - 1.2 mg/dL Final  . Alkaline Phosphatase 10/21/2018 61  39 - 117 U/L Final  . AST 10/21/2018 25  0 - 37 U/L Final  . ALT 10/21/2018 38  0 - 53 U/L Final  . Total Protein 10/21/2018 6.8  6.0 - 8.3 g/dL Final  . Albumin 10/21/2018 4.4  3.5 - 5.2 g/dL Final  . Calcium 10/21/2018 8.9  8.4 - 10.5 mg/dL Final  . GFR 10/21/2018 94.03  >60.00 mL/min Final  . Hgb A1c MFr Bld 10/21/2018 7.4* 4.6 - 6.5 % Final   Glycemic Control Guidelines for People with Diabetes:Non Diabetic:  <6%Goal of Therapy: <7%Additional Action Suggested:  >8%

## 2018-10-21 NOTE — Telephone Encounter (Signed)
I have reviewed his CGM and pump again in more detail and need to make the following changes and disregard instructions given on 6/9:   He will use a standard basal rate every day and will not change it from day-to-day regardless of his blood sugars  If blood sugar is high he needs to enter the actual blood sugar pre-meal to make corrections.  Also blood sugar reading needs to be used for correction boluses when blood sugars are over 150 in between meals or bedtime and not take arbitrarily 4 to 6 units  Increase bolus at dinnertime by 2 units, generally use 8 units for larger meals  To bolus 30-minute before eating  New standard basal settings: Midnight = 0.90, 4 AM = 0.8, 8 AM = 0.9, 3 PM = 0.75 and 6 PM = 0.9

## 2018-10-22 NOTE — Telephone Encounter (Signed)
Message left on machine of new basal rates, and stressed: 1.  Use only the standard basal pattern 2.  Put blood sugar readings in before all meals, and when readings are high, to let the pump calculate the dose. 3.  Do not give extra units of insulin when readings are high.  Put blood sugar reading in bolus setting with no carbs, and pump will calculate if needing more insulin.  4.  Bolus 30 min. Before all meals. 5. Increase supper bolus by 2 more units--generally 8u for larger meals.

## 2018-10-23 ENCOUNTER — Encounter: Payer: Self-pay | Admitting: Family Medicine

## 2018-10-23 ENCOUNTER — Other Ambulatory Visit: Payer: Self-pay

## 2018-10-23 ENCOUNTER — Ambulatory Visit (INDEPENDENT_AMBULATORY_CARE_PROVIDER_SITE_OTHER): Payer: PPO | Admitting: Family Medicine

## 2018-10-23 DIAGNOSIS — G5601 Carpal tunnel syndrome, right upper limb: Secondary | ICD-10-CM | POA: Diagnosis not present

## 2018-10-23 MED ORDER — GABAPENTIN 100 MG PO CAPS: 100.0000 mg | ORAL_CAPSULE | Freq: Three times a day (TID) | ORAL | 1 refills | Status: DC

## 2018-10-23 NOTE — Assessment & Plan Note (Signed)
Seems to have carpal tunnel of the right hand.  Last A1c was 7.4.  Less likely for cervical radiculopathy. -Counseled on the use of the cock-up splint. -Initiate gabapentin. -Counseled on home exercise therapy and supportive care. -If no improvement can consider injection or physical therapy.

## 2018-10-23 NOTE — Progress Notes (Signed)
Manuel Petty - 73 y.o. male MRN [id number]  Date of birth: January 09, 1946  SUBJECTIVE:  Including CC & ROS.  Chief Complaint  Patient presents with  . Hand Pain    right hand    Manuel Petty is a 73 y.o. male that is presenting with pain and numbness over the first 3 digits of the palmar aspect of the right hand.  This is been ongoing for a couple of weeks.  He notices the pain early in the morning.  He has worn a cock-up splint.  Symptoms seem to be intermittent in nature.  They are moderate in severity.  Seems to be localized to the hand with some radiation proximally to the mid forearm.  Denies any inciting event or trauma.  He is retired.  No history of similar symptoms.  No surgery on the hand.  Feels like a burning from time to time.    Review of Systems  Constitutional: Negative for fever.  HENT: Negative for congestion.   Respiratory: Negative for cough.   Cardiovascular: Negative for chest pain.  Gastrointestinal: Negative for abdominal distention.  Musculoskeletal: Positive for arthralgias.  Neurological: Negative for weakness.  Hematological: Negative for adenopathy.    HISTORY: Past Medical, Surgical, Social, and Family History Reviewed & Updated per EMR.   Pertinent Historical Findings include:  Past Medical History:  Diagnosis Date  . Diabetes mellitus ([place]) 2,000  . Erectile dysfunction   . HTN (hypertension)   . Hyperlipidemia   . Personal history of colonic adenomas 07/18/2009  . Renal cancer ([place])    1992, has 1 kidney ; does not see urology any more    Past Surgical History:  Procedure Laterality Date  . COLONOSCOPY    . NEPHRECTOMY  1992    No Known Allergies  Family History  Problem Relation Age of Onset  . Diabetes Father   . Hypertension Father   . CAD Father        F (MI age 57), Brother in [place] 95  . CAD Brother   . Prostate cancer Brother        dx age 61  . Cancer Brother        blood cancer   . Colon cancer Neg Hx   .  Stroke Neg Hx      Social History   Socioeconomic History  . Marital status: Married    Spouse name: Not on file  . Number of children: 1  . Years of education: Not on file  . Highest education level: Not on file  Occupational History  . Occupation: retired   Scientific laboratory technician  . Financial resource strain: Not on file  . Food insecurity    Worry: Not on file    Inability: Not on file  . Transportation needs    Medical: Not on file    Non-medical: Not on file  Tobacco Use  . Smoking status: Former Research scientist (life sciences)  . Smokeless tobacco: Never Used  . Tobacco comment: quit in 1992, 2 ppd  Substance and Sexual Activity  . Alcohol use: Yes    Alcohol/week: 2.0 standard drinks    Types: 2 Cans of beer per week  . Drug use: No    Comment: used to use marihuana    . Sexual activity: Not on file  Lifestyle  . Physical activity    Days per week: Not on file    Minutes per session: Not on file  . Stress: Not on file  Relationships  .  Social Herbalist on phone: Not on file    Gets together: Not on file    Attends religious service: Not on file    Active member of club or organization: Not on file    Attends meetings of clubs or organizations: Not on file    Relationship status: Not on file  . Intimate partner violence    Fear of current or ex partner: Not on file    Emotionally abused: Not on file    Physically abused: Not on file    Forced sexual activity: Not on file  Other Topics Concern  . Not on file  Social History Narrative   Lives w/ wife     PHYSICAL EXAM:  VS: BP (!) 161/84   Pulse 72   Ht 5\' 10"  (1.778 m)   Wt 179 lb (81.2 kg)   BMI 25.68 kg/m  Physical Exam Gen: NAD, alert, cooperative with exam, well-appearing ENT: normal lips, normal nasal mucosa,  Eye: normal EOM, normal conjunctiva and lids CV:  no edema, +2 pedal pulses   Resp: no accessory muscle use, non-labored,  Skin: no rashes, no areas of induration  Neuro: normal tone, normal sensation  to touch Psych:  normal insight, alert and oriented MSK:  Right hand: No signs of atrophy. Normal wrist range of motion. Normal grip strength. Normal strength resistance with finger abduction and abduction. Normal pincer grasp. Normal strength resistance with thumb hyperextension. Positive Tinel's at the wrist. Neurovascular intact     ASSESSMENT & PLAN:   CARPAL TUNNEL SYNDROME, BILATERAL Seems to have carpal tunnel of the right hand.  Last A1c was 7.4.  Less likely for cervical radiculopathy. -Counseled on the use of the cock-up splint. -Initiate gabapentin. -Counseled on home exercise therapy and supportive care. -If no improvement can consider injection or physical therapy.

## 2018-10-23 NOTE — Patient Instructions (Signed)
Nice to meet you Please wear the brace more often, during the day and night.  Please try the exercises  Please try the pennsaid  Start with one pill of gabapentin at night. You can increase this to 2 or 3 times daily as needed. This may cause some sleepiness  Please send me a message in MyChart with any questions or updates.  Please see me back in 2-3 weeks.   --Dr. Raeford Razor

## 2018-11-13 ENCOUNTER — Ambulatory Visit: Payer: Self-pay

## 2018-11-13 ENCOUNTER — Ambulatory Visit (INDEPENDENT_AMBULATORY_CARE_PROVIDER_SITE_OTHER): Payer: PPO | Admitting: Family Medicine

## 2018-11-13 ENCOUNTER — Other Ambulatory Visit: Payer: Self-pay

## 2018-11-13 ENCOUNTER — Encounter: Payer: Self-pay | Admitting: Family Medicine

## 2018-11-13 VITALS — BP 146/90 | HR 112 | Ht 70.0 in | Wt 180.0 lb

## 2018-11-13 DIAGNOSIS — G5601 Carpal tunnel syndrome, right upper limb: Secondary | ICD-10-CM

## 2018-11-13 MED ORDER — METHYLPREDNISOLONE ACETATE 40 MG/ML IJ SUSP
40.0000 mg | Freq: Once | INTRAMUSCULAR | Status: AC
  Administered 2018-11-13: 40 mg via INTRA_ARTICULAR

## 2018-11-13 NOTE — Patient Instructions (Signed)
Good to see you Please continue the brace  Please send me a message in MyChart with any questions or updates.  Please see me know if you have improvement of your symptoms. If not then we'll have to evaluate your neck..   --Dr. Raeford Razor

## 2018-11-13 NOTE — Assessment & Plan Note (Signed)
Symptoms seem to be exacerbating.  Mainly localized in the palm but does have some radiation.  If no improvement with the injection may need to consider cervical radiculopathy. -Carpal tunnel injection. -Can continue the brace. -Consider physical therapy or working up for cervical radiculopathy.

## 2018-11-13 NOTE — Progress Notes (Signed)
Manuel Petty - 73 y.o. male MRN [id number]  Date of birth: 01/07/1946  SUBJECTIVE:  Including CC & ROS.  Chief Complaint  Patient presents with  . Follow-up    follow up for right wrist    Manuel Petty is a 73 y.o. male that is presenting with acute worsening of his right hand carpal tunnel.  Has tried wearing the splint with no improvement.  Has tried gabapentin with limited improvement.  He seems and noticed the pain and symptoms more when he is lying down.  His symptoms are occurring on the first 3 digits of the palmar aspect of his right hand.  They are intermittent in nature.  They are moderate to severe.  Is burning sensation.  He does have a history of diabetes.  Independent review of his A1c from 6/9 shows 7.4.  Review of Systems  Constitutional: Negative for fever.  HENT: Negative for congestion.   Respiratory: Negative for cough.   Cardiovascular: Negative for chest pain.  Gastrointestinal: Negative for abdominal distention.  Musculoskeletal: Positive for arthralgias.  Skin: Negative for color change.  Neurological: Positive for weakness and numbness.    HISTORY: Past Medical, Surgical, Social, and Family History Reviewed & Updated per EMR.   Pertinent Historical Findings include:  Past Medical History:  Diagnosis Date  . Diabetes mellitus ([place]) 2,000  . Erectile dysfunction   . HTN (hypertension)   . Hyperlipidemia   . Personal history of colonic adenomas 07/18/2009  . Renal cancer ([place])    1992, has 1 kidney ; does not see urology any more    Past Surgical History:  Procedure Laterality Date  . COLONOSCOPY    . NEPHRECTOMY  1992    No Known Allergies  Family History  Problem Relation Age of Onset  . Diabetes Father   . Hypertension Father   . CAD Father        F (MI age 10), Brother in [place] 22  . CAD Brother   . Prostate cancer Brother        dx age 34  . Cancer Brother        blood cancer   . Colon cancer Neg Hx   . Stroke Neg Hx       Social History   Socioeconomic History  . Marital status: Married    Spouse name: Not on file  . Number of children: 1  . Years of education: Not on file  . Highest education level: Not on file  Occupational History  . Occupation: retired   Scientific laboratory technician  . Financial resource strain: Not on file  . Food insecurity    Worry: Not on file    Inability: Not on file  . Transportation needs    Medical: Not on file    Non-medical: Not on file  Tobacco Use  . Smoking status: Former Research scientist (life sciences)  . Smokeless tobacco: Never Used  . Tobacco comment: quit in 1992, 2 ppd  Substance and Sexual Activity  . Alcohol use: Yes    Alcohol/week: 2.0 standard drinks    Types: 2 Cans of beer per week  . Drug use: No    Comment: used to use marihuana    . Sexual activity: Not on file  Lifestyle  . Physical activity    Days per week: Not on file    Minutes per session: Not on file  . Stress: Not on file  Relationships  . Social Herbalist on phone: Not  on file    Gets together: Not on file    Attends religious service: Not on file    Active member of club or organization: Not on file    Attends meetings of clubs or organizations: Not on file    Relationship status: Not on file  . Intimate partner violence    Fear of current or ex partner: Not on file    Emotionally abused: Not on file    Physically abused: Not on file    Forced sexual activity: Not on file  Other Topics Concern  . Not on file  Social History Narrative   Lives w/ wife     PHYSICAL EXAM:  VS: BP (!) 146/90   Pulse (!) 112   Ht 5\' 10"  (1.778 m)   Wt 180 lb (81.6 kg)   BMI 25.83 kg/m  Physical Exam Gen: NAD, alert, cooperative with exam, well-appearing ENT: normal lips, normal nasal mucosa,  Eye: normal EOM, normal conjunctiva and lids CV:  no edema, +2 pedal pulses   Resp: no accessory muscle use, non-labored,  Skin: no rashes, no areas of induration  Neuro: normal tone, normal sensation to  touch Psych:  normal insight, alert and oriented MSK:  Right wrist: No tenderness to palpation over the [facility] joint. Normal wrist range of motion. No signs of atrophy. Normal grip strength. Normal strength resistance with finger abduction abduction. Negative Tinel's at the wrist. Neurovascularly intact  Limited ultrasound: Right wrist:  Median nerve in the carpal tunnel measures to be 0.11 cm.  Summary: Circumference measurement was normal.  Ultrasound and interpretation by Clearance Coots, MD   Aspiration/Injection Procedure Note MARISOL GLAZER 07-28-1945  Procedure: Injection Indications: Right carpal tunnel  Procedure Details Consent: Risks of procedure as well as the alternatives and risks of each were explained to the (patient/caregiver).  Consent for procedure obtained. Time Out: Verified patient identification, verified procedure, site/side was marked, verified correct patient position, special equipment/implants available, medications/allergies/relevent history reviewed, required imaging and test results available.  Performed.  The area was cleaned with iodine and alcohol swabs.    The right carpal tunnel was injected using 1 cc's of 40 mg Depo-Medrol and 2 cc's of 0.25 % bupivacaine with a 25 1 1/2" needle.  Ultrasound was used. Images were obtained in long views showing the injection.     A sterile dressing was applied.  Patient did tolerate procedure well.      ASSESSMENT & PLAN:   CARPAL TUNNEL SYNDROME, BILATERAL Symptoms seem to be exacerbating.  Mainly localized in the palm but does have some radiation.  If no improvement with the injection may need to consider cervical radiculopathy. -Carpal tunnel injection. -Can continue the brace. -Consider physical therapy or working up for cervical radiculopathy.

## 2018-11-26 ENCOUNTER — Other Ambulatory Visit: Payer: Self-pay | Admitting: Endocrinology

## 2018-11-28 ENCOUNTER — Encounter: Payer: Self-pay | Admitting: Family Medicine

## 2018-12-08 ENCOUNTER — Encounter: Payer: Self-pay | Admitting: Internal Medicine

## 2018-12-31 ENCOUNTER — Other Ambulatory Visit: Payer: Self-pay | Admitting: Endocrinology

## 2019-01-13 ENCOUNTER — Other Ambulatory Visit: Payer: Self-pay

## 2019-01-13 ENCOUNTER — Other Ambulatory Visit (INDEPENDENT_AMBULATORY_CARE_PROVIDER_SITE_OTHER): Payer: PPO

## 2019-01-13 ENCOUNTER — Other Ambulatory Visit: Payer: Self-pay | Admitting: Endocrinology

## 2019-01-13 DIAGNOSIS — Z794 Long term (current) use of insulin: Secondary | ICD-10-CM

## 2019-01-13 DIAGNOSIS — E1165 Type 2 diabetes mellitus with hyperglycemia: Secondary | ICD-10-CM | POA: Diagnosis not present

## 2019-01-13 LAB — BASIC METABOLIC PANEL
BUN: 18 mg/dL (ref 6–23)
CO2: 26 mEq/L (ref 19–32)
Calcium: 9.7 mg/dL (ref 8.4–10.5)
Chloride: 107 mEq/L (ref 96–112)
Creatinine, Ser: 0.77 mg/dL (ref 0.40–1.50)
GFR: 99.63 mL/min (ref >60.00)
Glucose, Bld: 152 mg/dL — ABNORMAL HIGH (ref 70–99)
Potassium: 4.5 mEq/L (ref 3.5–5.1)
Sodium: 140 mEq/L (ref 135–145)

## 2019-01-13 LAB — HEMOGLOBIN A1C: Hgb A1c MFr Bld: 6.7 % — ABNORMAL HIGH (ref 4.6–6.5)

## 2019-01-13 LAB — MICROALBUMIN / CREATININE URINE RATIO
Creatinine,U: 81.1 mg/dL
Microalb Creat Ratio: 7.7 mg/g (ref 0.0–30.0)
Microalb, Ur: 6.3 mg/dL — ABNORMAL HIGH (ref 0.0–1.9)

## 2019-01-20 ENCOUNTER — Encounter: Payer: Self-pay | Admitting: Endocrinology

## 2019-01-20 ENCOUNTER — Other Ambulatory Visit: Payer: Self-pay

## 2019-01-20 ENCOUNTER — Ambulatory Visit (INDEPENDENT_AMBULATORY_CARE_PROVIDER_SITE_OTHER): Payer: PPO | Admitting: Endocrinology

## 2019-01-20 VITALS — BP 140/72 | HR 70 | Ht 70.0 in | Wt 178.8 lb

## 2019-01-20 DIAGNOSIS — E782 Mixed hyperlipidemia: Secondary | ICD-10-CM

## 2019-01-20 DIAGNOSIS — E1165 Type 2 diabetes mellitus with hyperglycemia: Secondary | ICD-10-CM | POA: Diagnosis not present

## 2019-01-20 DIAGNOSIS — Z794 Long term (current) use of insulin: Secondary | ICD-10-CM

## 2019-01-20 NOTE — Progress Notes (Signed)
Patient ID: Manuel Petty, male   DOB: [birth year], 73 y.o.   MRN: [id number]   Reason for Appointment: follow-up diabetes  History of Present Illness   Diagnosis: Type 2 DIABETES MELITUS, date of diagnosis: 2007   Previous history: He had been on large doses of Lantus before he was given Victoza in addition and this improved his blood sugar control and reduced his insulin requirement However he subsequently started requiring mealtime coverage with large meals also  RECENT history:  Insulin regimen: [employer] brand Regular Insulin  OMNI pod insulin pump since 3/20, basal rate 0.9 midnight-4 AM, 4 AM-8 AM = 0.8, 8 AM-3 PM = 0.9.  3 PM = 0.75 and 6 PM = 0.9  Recommended boluses breakfast 4-6  units, lunch and supper 6-8 units  Non-insulin hypoglycemic drugs: Metformin 1 g with first meal  A1c is 6.7 compared to 7.4 Has been as low as 6.5 previously   Current blood sugar patterns and problems identified:  He has followed instructions for his basal rate changes since his visit 3 months ago  Also has not been making changes in the settings arbitrarily as before  Although he thinks he is using the freestyle libre this could not be downloaded today since he is using his smart phone rather than the reader more often and no data is available  Overall blood sugars are excellent and stable throughout the day  Blood sugar averages recently are between 100 at the lowest midday and 168 in the morning  Not clear however by his blood sugars overnight tends to fluctuate randomly late evening may be higher possibly from snacks that are not covered  Trying to bolus  30-minute before eating but sometimes forgets  Also generally trying to cut back on carbohydrates like potatoes  He is entering blood sugars in the pump from his fingersticks  This is usually correlated with his freestyle libre  Overall postprandial readings are not consistently higher and only occasionally  increased after breakfast or lunch  He is not doing much formal exercise and mostly yard work  Also no hypoglycemia       Side effects from medications: None  Proper timing of medications in relation to meals: Yes.          Monitors blood glucose:  Several times a day.    Glucometer:  FreeStyle Libre        PRE-MEAL Fasting Lunch Dinner Bedtime Overall  Glucose range:  99-266    78-200   Mean/median:  150  105   150  141+/-34   POST-MEAL PC Breakfast PC Lunch PC Dinner  Glucose range:     Mean/median:  133  147  158        Meals:2- 3 meals per day. Lunch 2-3 pm; dinner 6 pm.  For breakfast he will sometimes have English muffin and eggs otherwise only [place] bacon and egg beaters      Dietician visit: Most recent: 0/3009           Complications: are: None     Wt Readings from Last 3 Encounters:  01/20/19 178 lb 12.8 oz (81.1 kg)  11/13/18 180 lb (81.6 kg)  10/23/18 179 lb (81.2 kg)      Lab Results  Component Value Date   HGBA1C 6.7 (H) 01/13/2019   HGBA1C 7.4 (H) 10/21/2018   HGBA1C 7.1 (H) 06/17/2018   Lab Results  Component Value Date   MICROALBUR 6.3 (H) 01/13/2019   LDLCALC 78  10/21/2018   CREATININE 0.77 01/13/2019    Other active problems discussed in review of systems   Allergies as of 01/20/2019   No Known Allergies     Medication List       Accurate as of January 20, 2019 10:35 AM. If you have any questions, ask your nurse or doctor.        aspirin 81 MG tablet Take 81 mg by mouth daily.   diclofenac sodium 1 % Gel Commonly known as: VOLTAREN Apply 4 g topically 2 (two) times daily as needed.   FreeStyle Libre 14 Day Sensor Misc USE AS DIRECTED AND  CHANGE  EVERY  14  DAYS   FreeStyle Precision Neo Test test strip Generic drug: glucose blood USE TO CHECK BLOOD SUGAR UP TO 3 TIMES DAILY   gabapentin 100 MG capsule Commonly known as: NEURONTIN Take 1 capsule (100 mg total) by mouth 3 (three) times daily.   insulin regular 100  units/mL injection Commonly known as: NovoLIN R Inject 0.68 mLs (68 Units total) into the skin daily.   losartan 50 MG tablet Commonly known as: COZAAR TAKE 1 TABLET BY MOUTH EVERY DAY   metFORMIN 1000 MG tablet Commonly known as: GLUCOPHAGE TAKE 1 TABLET BY MOUTH ONCE DAILY.   OmniPod Dash System Kit 1 each by Other route See admin instructions. Use continuously to administer insulin.   OmniPod Dash 5 Pack Pods Misc 1 each by Other route See admin instructions. Apply 1 pod to body once every 3 days to administer insulin.   pravastatin 20 MG tablet Commonly known as: PRAVACHOL Take 1 tablet (20 mg total) by mouth daily.       Allergies: No Known Allergies  Past Medical History:  Diagnosis Date  . Diabetes mellitus ([place]) 2,000  . Erectile dysfunction   . HTN (hypertension)   . Hyperlipidemia   . Personal history of colonic adenomas 07/18/2009  . Renal cancer ([place])    1992, has 1 kidney ; does not see urology any more    Past Surgical History:  Procedure Laterality Date  . COLONOSCOPY    . NEPHRECTOMY  1992    Family History  Problem Relation Age of Onset  . Diabetes Father   . Hypertension Father   . CAD Father        F (MI age 41), Brother in hois 79  . CAD Brother   . Prostate cancer Brother        dx age 40  . Cancer Brother        blood cancer   . Colon cancer Neg Hx   . Stroke Neg Hx     Social History:  reports that he has quit smoking. He has never used smokeless tobacco. He reports current alcohol use of about 2.0 standard drinks of alcohol per week. He reports that he does not use drugs.  Review of Systems -      HYPERLIPIDEMIA:   He previously had been on Slo-Niacin OTC 500 mg twice a day with adequate control of LDL and triglycerides  HDL has been in the 20s previously along with small LDL particles  Particle number was below 1000 in 06/2014 but still has increased small particles  Currently only on pravastatin and needs follow-up  Lab  Results  Component Value Date   CHOL 125 10/21/2018   HDL 26.80 (L) 10/21/2018   LDLCALC 78 10/21/2018   LDLDIRECT 45.7 05/31/2009   TRIG 101.0 10/21/2018   CHOLHDL 5 10/21/2018  He has mild hypertension, taking LOSARTAN 50 mg instead of lisinopril since his potassium was high previously Blood pressure relatively higher in the office usually  He monitors BP at home, and blood pressure readings are 034-742 systolic range and diastolic usually below 80   BP Readings from Last 3 Encounters:  01/20/19 140/72  11/13/18 (!) 146/90  10/23/18 (!) 161/84   HYPERKALEMIA No recurrence  Lab Results  Component Value Date   K 4.5 01/13/2019   Eye exams: Annual 11/19   Diabetic foot exam was done in 01/2019   Examination:   BP 140/72 (BP Location: Left Arm, Patient Position: Sitting, Cuff Size: Normal)   Pulse 70   Ht _0  (1.778 m)   Wt 178 lb 12.8 oz (81.1 kg)   SpO2 96%   BMI 25.66 kg/m   Body mass index is 25.66 kg/m.   Diabetic Foot Exam - Simple   Simple Foot Form Diabetic Foot exam was performed with the following findings: Yes   Visual Inspection No deformities, no ulcerations, no other skin breakdown bilaterally: Yes Sensation Testing Intact to touch and monofilament testing bilaterally: Yes Pulse Check Posterior Tibialis and Dorsalis pulse intact bilaterally: Yes Comments     Assesment/Plan:   Diabetes type 2, nonobese: See history of present illness for detailed discussion of his current blood sugar patterns, problems identified   A1c is   improving at 6.7  Although his blood sugars are fairly stable throughout the day there is some fluctuation especially late evening and overnight as discussed above He is generally trying to be consistent with his management   Recommendations:  He will need to have his freestyle libre linked to our website and will try to get this downloaded when he is able to link  Make sure he boluses for all meals and snacks  including in the evening after dinner  Call if having consistently high readings in the morning on waking up  Try to bolus more consistently 30 minutes before eating  Check lipids on the next visit  There are no Patient Instructions on file for this visit.  Counseling time on subjects discussed in assessment and plan sections is over 50% of today's 25 minute visit     Elayne Snare 01/20/2019, 10:35 AM     No visits with results within 1 Week(s) from this visit.  Latest known visit with results is:  Lab on 01/13/2019  Component Date Value Ref Range Status  . Microalb, Ur 01/13/2019 6.3* 0.0 - 1.9 mg/dL Final  . Creatinine,U 01/13/2019 81.1  mg/dL Final  . Microalb Creat Ratio 01/13/2019 7.7  0.0 - 30.0 mg/g Final  . Sodium 01/13/2019 140  135 - 145 mEq/L Final  . Potassium 01/13/2019 4.5  3.5 - 5.1 mEq/L Final  . Chloride 01/13/2019 107  96 - 112 mEq/L Final  . CO2 01/13/2019 26  19 - 32 mEq/L Final  . Glucose, Bld 01/13/2019 152* 70 - 99 mg/dL Final  . BUN 01/13/2019 18  6 - 23 mg/dL Final  . Creatinine, Ser 01/13/2019 0.77  0.40 - 1.50 mg/dL Final  . Calcium 01/13/2019 9.7  8.4 - 10.5 mg/dL Final  . GFR 01/13/2019 99.63  >60.00 mL/min Final  . Hgb A1c MFr Bld 01/13/2019 6.7* 4.6 - 6.5 % Final   Glycemic Control Guidelines for People with Diabetes:Non Diabetic:  <6%Goal of Therapy: <7%Additional Action Suggested:  >8%

## 2019-03-13 ENCOUNTER — Other Ambulatory Visit: Payer: Self-pay | Admitting: Endocrinology

## 2019-03-18 ENCOUNTER — Other Ambulatory Visit: Payer: Self-pay | Admitting: Endocrinology

## 2019-03-28 ENCOUNTER — Other Ambulatory Visit: Payer: Self-pay | Admitting: Endocrinology

## 2019-03-31 DIAGNOSIS — E119 Type 2 diabetes mellitus without complications: Secondary | ICD-10-CM | POA: Diagnosis not present

## 2019-03-31 DIAGNOSIS — H524 Presbyopia: Secondary | ICD-10-CM | POA: Diagnosis not present

## 2019-04-08 ENCOUNTER — Other Ambulatory Visit: Payer: Self-pay

## 2019-05-19 ENCOUNTER — Other Ambulatory Visit: Payer: Self-pay

## 2019-05-19 ENCOUNTER — Other Ambulatory Visit (INDEPENDENT_AMBULATORY_CARE_PROVIDER_SITE_OTHER): Payer: PPO

## 2019-05-19 DIAGNOSIS — E782 Mixed hyperlipidemia: Secondary | ICD-10-CM | POA: Diagnosis not present

## 2019-05-19 DIAGNOSIS — E1165 Type 2 diabetes mellitus with hyperglycemia: Secondary | ICD-10-CM | POA: Diagnosis not present

## 2019-05-19 DIAGNOSIS — Z794 Long term (current) use of insulin: Secondary | ICD-10-CM

## 2019-05-19 LAB — LIPID PANEL
Cholesterol: 94 mg/dL (ref 0–200)
HDL: 26.8 mg/dL — ABNORMAL LOW (ref >39.00)
LDL Cholesterol: 51 mg/dL (ref 0–99)
NonHDL: 67.51
Total CHOL/HDL Ratio: 4
Triglycerides: 83 mg/dL (ref 0.0–149.0)
VLDL: 16.6 mg/dL (ref 0.0–40.0)

## 2019-05-19 LAB — COMPREHENSIVE METABOLIC PANEL
ALT: 39 U/L (ref 0–53)
AST: 23 U/L (ref 0–37)
Albumin: 4.4 g/dL (ref 3.5–5.2)
Alkaline Phosphatase: 88 U/L (ref 39–117)
BUN: 14 mg/dL (ref 6–23)
CO2: 27 mEq/L (ref 19–32)
Calcium: 9 mg/dL (ref 8.4–10.5)
Chloride: 106 mEq/L (ref 96–112)
Creatinine, Ser: 0.76 mg/dL (ref 0.40–1.50)
GFR: 101.04 mL/min (ref >60.00)
Glucose, Bld: 157 mg/dL — ABNORMAL HIGH (ref 70–99)
Potassium: 4.9 mEq/L (ref 3.5–5.1)
Sodium: 138 mEq/L (ref 135–145)
Total Bilirubin: 0.5 mg/dL (ref 0.2–1.2)
Total Protein: 7 g/dL (ref 6.0–8.3)

## 2019-05-19 LAB — HEMOGLOBIN A1C: Hgb A1c MFr Bld: 6.5 % (ref 4.6–6.5)

## 2019-05-26 ENCOUNTER — Ambulatory Visit: Payer: PPO | Admitting: Endocrinology

## 2019-05-26 ENCOUNTER — Encounter: Payer: Self-pay | Admitting: Endocrinology

## 2019-05-26 ENCOUNTER — Other Ambulatory Visit: Payer: Self-pay

## 2019-05-26 VITALS — BP 140/70 | HR 74 | Ht 70.0 in | Wt 176.2 lb

## 2019-05-26 DIAGNOSIS — Z794 Long term (current) use of insulin: Secondary | ICD-10-CM

## 2019-05-26 DIAGNOSIS — E1169 Type 2 diabetes mellitus with other specified complication: Secondary | ICD-10-CM

## 2019-05-26 DIAGNOSIS — E1165 Type 2 diabetes mellitus with hyperglycemia: Secondary | ICD-10-CM

## 2019-05-26 DIAGNOSIS — E782 Mixed hyperlipidemia: Secondary | ICD-10-CM | POA: Diagnosis not present

## 2019-05-26 NOTE — Progress Notes (Signed)
  Patient ID: Manuel Petty, male   DOB: 04/16/1946, 73 y.o.   MRN: [id number]   Reason for Appointment: follow-up diabetes  History of Present Illness   Diagnosis: Type 2 DIABETES MELITUS, date of diagnosis: 2007   Previous history: He had been on large doses of Lantus before he was given Victoza in addition and this improved his blood sugar control and reduced his insulin requirement However he subsequently started requiring mealtime coverage with large meals also  RECENT history:  Insulin regimen: [employer] brand Regular Insulin  OMNI pod insulin pump since 3/20, basal rate 0.9 midnight-4 AM, 4 AM-8 AM = 0.8, 8 AM-3 PM = 0.9.  3 PM = 0.75 and 6 PM = 0.9  Recommended boluses breakfast 4-6  units, lunch and supper 6-8 units  Non-insulin hypoglycemic drugs: Metformin 1 g before breakfast  A1c is  6.5 and improved   Current blood sugar patterns and problems identified:  He has been using fingerstick blood sugars into his pump at the time of boluses for better accuracy  As discussed below his readings are relatively lower with the freestyle libre compared to fingersticks  Although he thinks early morning blood sugars are relatively low and he will generally drink some milk or Sprite if blood sugars are in the 80s he does not feel hypoglycemic  Only occasionally depending on his diet he may have a higher reading after dinner  Compared to his last visit fasting readings are better although still relatively high on an average at about 139 on his fingersticks  He has done less exercise and not going to the gym in the last month, does a little walking  Weight is stable to lower       Side effects from medications: None  Proper timing of medications in relation to meals: Yes.          Monitors blood glucose:  Several times a day.    Glucometer:  FreeStyle Libre        CONTINUOUS GLUCOSE MONITORING RECORD INTERPRETATION    Dates of Recording: 12/30 through  05/26/2019  Sensor description: Freestyle libre  Results statistics:   CGM use % of time  97  Average and SD  129, GV 25  Time in range     92   %  % Time Above 180  7  % Time above 250   % Time Below target  1    PRE-MEAL Fasting Lunch Dinner Bedtime Overall  Glucose range:       Mean/median:  119   139  153 129   POST-MEAL PC Breakfast PC Lunch PC Dinner  Glucose range:     Mean/median:  129  139  154    Glycemic patterns summary:   The freestyle libre is reading about 30 mg lower than lab glucose with lab glucose 157 compared to 123 on the libre  Blood sugars are lowest between 2-4 AM Highest blood sugars are after supper between 8-10 PM No hypoglycemia with occasional low normal readings around 3 AM  Hyperglycemic episodes are minimal but may occur after dinner/late evening with less than 50% of days blood sugars going over 180  Hypoglycemic episodes are minimal considering freestyle libre is reading lower than expected  Overnight periods: Blood sugars are higher around midnight averaging 140 and flat between 2 AM-6 AM in the low 100+ range  Preprandial periods: Blood sugars at breakfast time around 10 AM are about 127 and somewhat higher than when   he wakes up At lunch and dinner blood sugars are also about the same on an average Premeal boluses at dinnertime are usually around 6-8 PM  Postprandial periods:   After breakfast: Blood sugars are relatively flat before and after breakfast and also after lunch     After dinner: Blood sugars are mostly well controlled with only a few readings higher as discussed above  PREVIOUS readings:  PRE-MEAL Fasting Lunch Dinner Bedtime Overall  Glucose range:  99-266    78-200   Mean/median:  150  105   150  141+/-34   POST-MEAL PC Breakfast PC Lunch PC Dinner  Glucose range:     Mean/median:  133  147  158        Meals:2- 3 meals per day. Lunch 2-3 pm; dinner 6 pm.  For breakfast he will sometimes have English muffin  and eggs otherwise only [place] bacon and egg beaters      Dietician visit: Most recent: 08/6566           Complications: are: None     Wt Readings from Last 3 Encounters:  05/26/19 176 lb 3.2 oz (79.9 kg)  01/20/19 178 lb 12.8 oz (81.1 kg)  11/13/18 180 lb (81.6 kg)      Lab Results  Component Value Date   HGBA1C 6.5 05/19/2019   HGBA1C 6.7 (H) 01/13/2019   HGBA1C 7.4 (H) 10/21/2018   Lab Results  Component Value Date   MICROALBUR 6.3 (H) 01/13/2019   LDLCALC 51 05/19/2019   CREATININE 0.76 05/19/2019    Other active problems discussed in review of systems   Allergies as of 05/26/2019   No Known Allergies     Medication List       Accurate as of May 26, 2019 10:19 AM. If you have any questions, ask your nurse or doctor.        STOP taking these medications   diclofenac sodium 1 % Gel Commonly known as: VOLTAREN Stopped by: Elayne Snare, MD   gabapentin 100 MG capsule Commonly known as: NEURONTIN Stopped by: Elayne Snare, MD     TAKE these medications   aspirin 81 MG tablet Take 81 mg by mouth daily.   FreeStyle Libre 14 Day Sensor Misc USE AS DIRECTED AND  CHANGE  EVERY  14  DAYS   FreeStyle Precision Neo Test test strip Generic drug: glucose blood USE TO CHECK BLOOD SUGAR FOR UP TP 3 TIMES DAILY   insulin regular 100 units/mL injection Commonly known as: NovoLIN R Inject 0.68 mLs (68 Units total) into the skin daily.   losartan 50 MG tablet Commonly known as: COZAAR TAKE 1 TABLET BY MOUTH EVERY DAY   metFORMIN 1000 MG tablet Commonly known as: GLUCOPHAGE TAKE 1 TABLET BY MOUTH ONCE DAILY.   OmniPod Dash System Kit 1 each by Other route See admin instructions. Use continuously to administer insulin.   OmniPod Dash 5 Pack Pods Misc 1 each by Other route See admin instructions. Apply 1 pod to body once every 3 days to administer insulin.   pravastatin 20 MG tablet Commonly known as: PRAVACHOL Take 1 tablet (20 mg total) by mouth daily.         Allergies: No Known Allergies  Past Medical History:  Diagnosis Date  . Diabetes mellitus ([place]) 2,000  . Erectile dysfunction   . HTN (hypertension)   . Hyperlipidemia   . Personal history of colonic adenomas 07/18/2009  . Renal cancer ([place])    1992, has  1 kidney ; does not see urology any more    Past Surgical History:  Procedure Laterality Date  . COLONOSCOPY    . NEPHRECTOMY  1992    Family History  Problem Relation Age of Onset  . Diabetes Father   . Hypertension Father   . CAD Father        F (MI age 58), Brother in hois 60  . CAD Brother   . Prostate cancer Brother        dx age 71  . Cancer Brother        blood cancer   . Colon cancer Neg Hx   . Stroke Neg Hx     Social History:  reports that he has quit smoking. He has never used smokeless tobacco. He reports current alcohol use of about 2.0 standard drinks of alcohol per week. He reports that he does not use drugs.  Review of Systems -     HYPERLIPIDEMIA:   He previously had been on Slo-Niacin OTC 500 mg twice a day with adequate control of LDL and triglycerides  HDL has been in the 20s previously along with small LDL particles  Particle number was below 1000 in 06/2014 with increased small particles  Currently only on pravastatin, had difficulty tolerating niacin   Lab Results  Component Value Date   CHOL 94 05/19/2019   HDL 26.80 (L) 05/19/2019   LDLCALC 51 05/19/2019   LDLDIRECT 45.7 05/31/2009   TRIG 83.0 05/19/2019   CHOLHDL 4 05/19/2019    He has mild hypertension, taking LOSARTAN 50 mg instead of lisinopril since his potassium was high on lisinopril  He monitors BP at home, and blood pressure readings have not been abnormal   BP Readings from Last 3 Encounters:  05/26/19 140/70  01/20/19 140/72  11/13/18 (!) 146/90   Has stable potassium  Lab Results  Component Value Date   K 4.9 05/19/2019   Eye exams: Annual, last 03/2019, report not available   Diabetic foot exam was  done in 01/2019   Examination:   BP 140/70 (BP Location: Left Arm, Patient Position: Sitting, Cuff Size: Normal)   Pulse 74   Ht 5' 10" (1.778 m)   Wt 176 lb 3.2 oz (79.9 kg)   SpO2 98%   BMI 25.28 kg/m   Body mass index is 25.28 kg/m.    Assesment/Plan:   Diabetes type 2, nonobese: See history of present illness for detailed discussion of his current blood sugar patterns, problems identified   A1c is   improving at 6.5  His blood sugars are generally better and more stable compared to last visit Was able to review his CGM better on this visit compared to last visit and detailed analysis is as discussed above Day-to-day management and patterns were as discussed  He is clinically trying to watch his carbohydrates better in the evenings when he otherwise would have high readings and usually low-fat meals As discussed above his freestyle libre appears to be reading somewhat lower than actual blood sugar Discussed options for Dexcom or the version 2 of the freestyle libre and pros and cons for both  He does have a rise in blood sugar in the mornings after waking up Although he is not having hypoglycemia he is concerned about low normal readings during the night Taking Metformin only in the morning because of tendency to low sugars previously when taking this in the evening   Recommendations:  He will try the freestyle libre 2   and given the new reader  Basal rate to be reduced to 0.85 at 12 AM and increased from 7 AM-noon up to 0.95  He will follow up to let us know if he has abnormal blood sugar patterns continuing  Exercise as much as possible  Discussed blood sugar targets and comparison of fingersticks as well as CGM  Discussed bolusing 30-minute before eating because of taking regular insulin  Follow-up in 3 months for repeat A1c  HYPERLIPIDEMIA: Excellent control but as before HDL is low Emphasized need for exercise also  We will try to get reports of his last eye  exam faxed over  There are no Patient Instructions on file for this visit.      Ajay Kumar 05/26/2019, 10:19 AM     No visits with results within 1 Week(s) from this visit.  Latest known visit with results is:  Lab on 05/19/2019  Component Date Value Ref Range Status  . Cholesterol 05/19/2019 94  0 - 200 mg/dL Final   ATP III Classification       Desirable:  < 200 mg/dL               Borderline High:  200 - 239 mg/dL          High:  > = 240 mg/dL  . Triglycerides 05/19/2019 83.0  0.0 - 149.0 mg/dL Final   Normal:  <150 mg/dLBorderline High:  150 - 199 mg/dL  . HDL 05/19/2019 26.80* >39.00 mg/dL Final  . VLDL 05/19/2019 16.6  0.0 - 40.0 mg/dL Final  . LDL Cholesterol 05/19/2019 51  0 - 99 mg/dL Final  . Total CHOL/HDL Ratio 05/19/2019 4   Final                  Men          Women1/2 Average Risk     3.4          3.3Average Risk          5.0          4.42X Average Risk          9.6          7.13X Average Risk          15.0          11.0                      . NonHDL 05/19/2019 67.51   Final   NOTE:  Non-HDL goal should be 30 mg/dL higher than patient's LDL goal (i.e. LDL goal of < 70 mg/dL, would have non-HDL goal of < 100 mg/dL)  . Sodium 05/19/2019 138  135 - 145 mEq/L Final  . Potassium 05/19/2019 4.9  3.5 - 5.1 mEq/L Final  . Chloride 05/19/2019 106  96 - 112 mEq/L Final  . CO2 05/19/2019 27  19 - 32 mEq/L Final  . Glucose, Bld 05/19/2019 157* 70 - 99 mg/dL Final  . BUN 05/19/2019 14  6 - 23 mg/dL Final  . Creatinine, Ser 05/19/2019 0.76  0.40 - 1.50 mg/dL Final  . Total Bilirubin 05/19/2019 0.5  0.2 - 1.2 mg/dL Final  . Alkaline Phosphatase 05/19/2019 88  39 - 117 U/L Final  . AST 05/19/2019 23  0 - 37 U/L Final  . ALT 05/19/2019 39  0 - 53 U/L Final  . Total Protein 05/19/2019 7.0  6.0 - 8.3 g/dL Final  . Albumin 05/19/2019 4.4  3.5 -   5.2 g/dL Final  . GFR 05/19/2019 101.04  >60.00 mL/min Final  . Calcium 05/19/2019 9.0  8.4 - 10.5 mg/dL Final  . Hgb A1c MFr Bld  05/19/2019 6.5  4.6 - 6.5 % Final   Glycemic Control Guidelines for People with Diabetes:Non Diabetic:  <6%Goal of Therapy: <7%Additional Action Suggested:  >8%              

## 2019-06-04 ENCOUNTER — Other Ambulatory Visit: Payer: Self-pay

## 2019-06-04 MED ORDER — FREESTYLE LIBRE 2 READER SYSTM DEVI: 1.0000 | Freq: Every day | 1 refills | Status: DC

## 2019-06-04 MED ORDER — FREESTYLE LIBRE 2 SENSOR SYSTM MISC: 1.0000 | 2 refills | Status: DC

## 2019-06-19 ENCOUNTER — Ambulatory Visit: Payer: Self-pay

## 2019-06-22 ENCOUNTER — Ambulatory Visit: Payer: PPO | Attending: Internal Medicine

## 2019-06-22 DIAGNOSIS — Z23 Encounter for immunization: Secondary | ICD-10-CM | POA: Insufficient documentation

## 2019-06-22 NOTE — Progress Notes (Signed)
   Covid-19 Vaccination Clinic  Name:  Manuel Petty    MRN: [id number] DOB: Feb 19, 1946  06/22/2019  Mr. Manuel Petty was observed post Covid-19 immunization for 15 minutes without incidence. He was provided with Vaccine Information Sheet and instruction to access the V-Safe system.   Mr. Manuel Petty was instructed to call 911 with any severe reactions post vaccine: Marland Kitchen Difficulty breathing  . Swelling of your face and throat  . A fast heartbeat  . A bad rash all over your body  . Dizziness and weakness    Immunizations Administered    Name Date Dose VIS Date Route   Pfizer COVID-19 Vaccine 06/22/2019  1:51 PM 0.3 mL 04/24/2019 Intramuscular   Manufacturer: [place]   Lot: [id number]   [place]: [id number]

## 2019-06-24 ENCOUNTER — Other Ambulatory Visit: Payer: Self-pay | Admitting: Endocrinology

## 2019-07-04 ENCOUNTER — Other Ambulatory Visit: Payer: Self-pay | Admitting: Endocrinology

## 2019-07-09 ENCOUNTER — Ambulatory Visit: Payer: PPO

## 2019-07-17 ENCOUNTER — Ambulatory Visit: Payer: PPO | Attending: Internal Medicine

## 2019-07-17 DIAGNOSIS — Z23 Encounter for immunization: Secondary | ICD-10-CM

## 2019-07-17 NOTE — Progress Notes (Signed)
   Covid-19 Vaccination Clinic  Name:  Manuel Petty    MRN: [id number] DOB: 1945-07-17  07/17/2019  [name] was observed post Covid-19 immunization for 15 minutes without incident. He was provided with Vaccine Information Sheet and instruction to access the V-Safe system.   [name] was instructed to call 911 with any severe reactions post vaccine: Marland Kitchen Difficulty breathing  . Swelling of face and throat  . A fast heartbeat  . A bad rash all over body  . Dizziness and weakness   Immunizations Administered    Name Date Dose VIS Date Route   Pfizer COVID-19 Vaccine 07/17/2019 11:28 AM 0.3 mL 04/24/2019 Intramuscular   Manufacturer: [place]   Lot: [id number]   [place]: [id number]

## 2019-08-25 ENCOUNTER — Other Ambulatory Visit (INDEPENDENT_AMBULATORY_CARE_PROVIDER_SITE_OTHER): Payer: PPO

## 2019-08-25 ENCOUNTER — Other Ambulatory Visit: Payer: Self-pay

## 2019-08-25 DIAGNOSIS — E1165 Type 2 diabetes mellitus with hyperglycemia: Secondary | ICD-10-CM | POA: Diagnosis not present

## 2019-08-25 DIAGNOSIS — Z794 Long term (current) use of insulin: Secondary | ICD-10-CM | POA: Diagnosis not present

## 2019-08-25 LAB — BASIC METABOLIC PANEL
BUN: 15 mg/dL (ref 6–23)
CO2: 25 mEq/L (ref 19–32)
Calcium: 8.7 mg/dL (ref 8.4–10.5)
Chloride: 104 mEq/L (ref 96–112)
Creatinine, Ser: 0.75 mg/dL (ref 0.40–1.50)
GFR: 102.52 mL/min (ref >60.00)
Glucose, Bld: 139 mg/dL — ABNORMAL HIGH (ref 70–99)
Potassium: 3.8 mEq/L (ref 3.5–5.1)
Sodium: 136 mEq/L (ref 135–145)

## 2019-08-25 LAB — HEMOGLOBIN A1C: Hgb A1c MFr Bld: 6.6 % — ABNORMAL HIGH (ref 4.6–6.5)

## 2019-09-01 ENCOUNTER — Ambulatory Visit: Payer: PPO | Admitting: Endocrinology

## 2019-09-01 ENCOUNTER — Other Ambulatory Visit: Payer: Self-pay

## 2019-09-01 ENCOUNTER — Encounter: Payer: Self-pay | Admitting: Endocrinology

## 2019-09-01 VITALS — BP 124/60 | HR 63 | Ht 70.0 in | Wt 170.8 lb

## 2019-09-01 DIAGNOSIS — I10 Essential (primary) hypertension: Secondary | ICD-10-CM | POA: Diagnosis not present

## 2019-09-01 DIAGNOSIS — E1169 Type 2 diabetes mellitus with other specified complication: Secondary | ICD-10-CM

## 2019-09-01 DIAGNOSIS — Z794 Long term (current) use of insulin: Secondary | ICD-10-CM | POA: Diagnosis not present

## 2019-09-01 DIAGNOSIS — E782 Mixed hyperlipidemia: Secondary | ICD-10-CM | POA: Diagnosis not present

## 2019-09-01 DIAGNOSIS — E119 Type 2 diabetes mellitus without complications: Secondary | ICD-10-CM

## 2019-09-01 MED ORDER — FREESTYLE LIBRE 2 SENSOR MISC: 1.0000 | 1 refills | Status: DC

## 2019-09-01 NOTE — Progress Notes (Signed)
Patient ID: Manuel Petty, male   DOB: [birth year], 74 y.o.   MRN: [id number]   Reason for Appointment: follow-up diabetes  History of Present Illness   Diagnosis: Type 2 DIABETES MELITUS, date of diagnosis: 2007   Previous history: He had been on large doses of Lantus before he was given Victoza in addition and this improved his blood sugar control and reduced his insulin requirement However he subsequently started requiring mealtime coverage with large meals also  RECENT history:  Insulin regimen: [employer] brand Regular Insulin  OMNI pod insulin pump since 3/20 basal rate 0. 85 midnight-4 AM, 4 AM- 7 AM = 0.8, 7 AM- 12 noon = 0.95, 12 noon-3 PM = 0.9.  3 PM = 0.75 and 6 PM = 0.9  Recommended boluses breakfast 4-6  units, lunch and supper 6-8 units  Recently total daily insulin use about 31 minutes  Non-insulin hypoglycemic drugs: Metformin 1 g before breakfast  A1c is  6.6 and stable   Current blood sugar patterns and problems identified:  His freestyle libre version 2 is reportedly slightly more accurate but still seems to read lower than lab glucose  He is bolusing mostly for 2 meals a day and generally putting in the carbohydrate treatment.  Even though he may sometimes have a snack around 7 PM he may not always bolus at this time  Also has been asked to bolus 30-minute before eating  Blood sugars are rising spontaneously between 10 PM-2 AM  He is concerned about readings in the 60s overnight but mostly early morning before waking up and asymptomatic  Occasionally will have low sugars with exercise but does not always remember to suspend his pump or use temporary basal       Side effects from medications: None  Proper timing of medications in relation to meals: Yes.          Monitors blood glucose:  Several times a day.    Glucometer:  FreeStyle Libre        CONTINUOUS GLUCOSE MONITORING RECORD INTERPRETATION    Dates of Recording: Last 2  weeks  Sensor description: Libre  Results statistics:   CGM use % of time  97  Average and SD  122  Time in range    95    %  % Time Above 180  4  % Time above 250   % Time Below target  1      Glycemic patterns summary: Blood sugars are likely still falsely low by up to 20 mg on comparison with lab glucose However blood sugar variability is minimal throughout the day Highest blood sugars are after 10 PM for about 4 hours Lowest blood sugars are between 6-8 AM   Hyperglycemic episodes are occurring primarily late at night after 10 PM and only to limited degree after lunch at times  Hypoglycemic episodes are minimal with transient low normal readings early morning and rarely before dinner  Overnight periods: Blood sugars are decreasing after being averaging about 150 at midnight down to near normal levels at waking up time  Preprandial periods: Blood sugars are averaging about 110 at breakfast, usually not eating lunch more variability in glucose at dinnertime  Postprandial periods:   After breakfast: Blood sugars are very well controlled with minimal rise after the meal No lunch meals After dinner: Blood sugars are generally not rising excessively after the meal except on occasion      CGM use % of time  97  Average and SD  129, GV 25  Time in range     92   %  % Time Above 180  7  % Time above 250   % Time Below target  1    PRE-MEAL Fasting Lunch Dinner Bedtime Overall  Glucose range:       Mean/median:  119   139  153 129   POST-MEAL PC Breakfast PC Lunch PC Dinner  Glucose range:     Mean/median:  129  139  154       Meals:2- 3 meals per day. Lunch 2-3 pm; dinner 6 pm.  For breakfast he will sometimes have English muffin and eggs otherwise only [place] bacon and egg beaters      Dietician visit: Most recent: 05/6107           Complications: are: None     Wt Readings from Last 3 Encounters:  09/01/19 170 lb 12.8 oz (77.5 kg)  05/26/19 176 lb 3.2 oz  (79.9 kg)  01/20/19 178 lb 12.8 oz (81.1 kg)      Lab Results  Component Value Date   HGBA1C 6.6 (H) 08/25/2019   HGBA1C 6.5 05/19/2019   HGBA1C 6.7 (H) 01/13/2019   Lab Results  Component Value Date   MICROALBUR 6.3 (H) 01/13/2019   LDLCALC 51 05/19/2019   CREATININE 0.75 08/25/2019    Other active problems discussed in review of systems   Allergies as of 09/01/2019   No Known Allergies     Medication List       Accurate as of September 01, 2019 12:03 PM. If you have any questions, ask your nurse or doctor.        aspirin 81 MG tablet Take 81 mg by mouth daily.   FreeStyle [place] 2 Reader Systm Devi 1 Device by Does not apply route daily.   FreeStyle Libre 2 Sensor Misc 1 each by Does not apply route every 14 (fourteen) days. Use one sensor once every 14 days to monitor blood sugar. What changed: Another medication with the same name was removed. Continue taking this medication, and follow the directions you see here. Changed by: Jayme Cloud, LPN   FreeStyle Precision Neo Test test strip Generic drug: glucose blood USE TO CHECK BLOOD SUGAR FOR UP TP 3 TIMES DAILY   insulin regular 100 units/mL injection Commonly known as: NovoLIN R Inject 0.68 mLs (68 Units total) into the skin daily.   losartan 50 MG tablet Commonly known as: COZAAR TAKE 1 TABLET BY MOUTH EVERY DAY   metFORMIN 1000 MG tablet Commonly known as: GLUCOPHAGE TAKE 1 TABLET BY MOUTH ONCE DAILY.   OmniPod Dash System Kit 1 each by Other route See admin instructions. Use continuously to administer insulin.   OmniPod Dash 5 Pack Pods Misc CHANGE DASH PODS EVERY 72 HOURS AS DIRECTED   pravastatin 20 MG tablet Commonly known as: PRAVACHOL Take 1 tablet (20 mg total) by mouth daily.       Allergies: No Known Allergies  Past Medical History:  Diagnosis Date  . Diabetes mellitus ([place]) 2,000  . Erectile dysfunction   . HTN (hypertension)   . Hyperlipidemia   . Personal history of colonic  adenomas 07/18/2009  . Renal cancer ([place])    1992, has 1 kidney ; does not see urology any more    Past Surgical History:  Procedure Laterality Date  . COLONOSCOPY    . NEPHRECTOMY  1992    Family History  Problem  Relation Age of Onset  . Diabetes Father   . Hypertension Father   . CAD Father        F (MI age 6), Brother in [place] 90  . CAD Brother   . Prostate cancer Brother        dx age 23  . Cancer Brother        blood cancer   . Colon cancer Neg Hx   . Stroke Neg Hx     Social History:  reports that he has quit smoking. He has never used smokeless tobacco. He reports current alcohol use of about 2.0 standard drinks of alcohol per week. He reports that he does not use drugs.  Review of Systems -     HYPERLIPIDEMIA:   Currently only on pravastatin, had difficulty tolerating niacin  He previously had been on Slo-Niacin OTC 500 mg twice a day  HDL has been in the 20s along with small LDL particles in the past Particle number was below 1000 in 06/2014 with increased small particles    Lab Results  Component Value Date   CHOL 94 05/19/2019   HDL 26.80 (L) 05/19/2019   LDLCALC 51 05/19/2019   LDLDIRECT 45.7 05/31/2009   TRIG 83.0 05/19/2019   CHOLHDL 4 05/19/2019    He has mild hypertension, taking LOSARTAN 50 mg Previously potassium was high on lisinopril  He monitors BP at home and is well controlled   BP Readings from Last 3 Encounters:  09/01/19 124/60  05/26/19 140/70  01/20/19 140/72   Has stable potassium  Lab Results  Component Value Date   K 3.8 08/25/2019   Eye exams: Annual, last 03/2019, report not available   Diabetic foot exam was done in 01/2019   Examination:   BP 124/60 (BP Location: Left Arm, Patient Position: Sitting, Cuff Size: Normal)   Pulse 63   Ht 5' 10" (1.778 m)   Wt 170 lb 12.8 oz (77.5 kg)   SpO2 98%   BMI 24.51 kg/m   Body mass index is 24.51 kg/m.    Assesment/Plan:   Diabetes type 2, nonobese: See history of  present illness for detailed discussion of his current blood sugar patterns, problems identified   A1c is excellent at 6.6  Currently using the OmniPod insulin with the Novolin regular insulin  He is benefiting from using the freestyle libre version 2 with low alerts although he still seems to have somewhat low readings than the actual lab or fingerstick readings with the new version also Blood sugar patterns, day-to-day variability, postprandial control, hypoglycemic events, prevention of low sugars and treatment of high and low sugars as well as suggesting pump settings were discussed today He is exercising and overall doing well with diet  Taking Metformin only in the morning because of tendency to low sugars previously when taking this in the evening   He will try to check his fingersticks more often to calibrate his freestyle libre  Reduce 4 AM basal rate down to 0.75  Needs to increase basal rate at 10 PM for 2 hours up to 1.0  Temporary basal or suspend the pump when doing significant exercise like mowing the lawn or brisk walking especially blood sugars are near normal  Bolus 30-minute before meals and snacks and entering carbohydrates when eating snacks in the evenings  HYPERTENSION: Excellent control   There are no Patient Instructions on file for this visit.      Elayne Snare 09/01/2019, 12:03 PM

## 2019-09-20 ENCOUNTER — Other Ambulatory Visit: Payer: Self-pay | Admitting: Endocrinology

## 2019-09-21 ENCOUNTER — Other Ambulatory Visit: Payer: Self-pay | Admitting: Endocrinology

## 2019-10-03 ENCOUNTER — Other Ambulatory Visit: Payer: Self-pay | Admitting: Endocrinology

## 2019-10-11 ENCOUNTER — Other Ambulatory Visit: Payer: Self-pay | Admitting: Endocrinology

## 2019-11-11 ENCOUNTER — Encounter: Payer: Self-pay | Admitting: Family Medicine

## 2019-11-11 ENCOUNTER — Ambulatory Visit: Payer: PPO | Admitting: Family Medicine

## 2019-11-11 ENCOUNTER — Other Ambulatory Visit: Payer: Self-pay

## 2019-11-11 ENCOUNTER — Ambulatory Visit: Payer: Self-pay

## 2019-11-11 VITALS — BP 157/71 | HR 71 | Ht 70.0 in | Wt 170.0 lb

## 2019-11-11 DIAGNOSIS — G5601 Carpal tunnel syndrome, right upper limb: Secondary | ICD-10-CM

## 2019-11-11 MED ORDER — METHYLPREDNISOLONE ACETATE 40 MG/ML IJ SUSP
40.0000 mg | Freq: Once | INTRAMUSCULAR | Status: AC
  Administered 2019-11-11: 40 mg via INTRA_ARTICULAR

## 2019-11-11 NOTE — Assessment & Plan Note (Signed)
Worsening of the right carpal tunnel syndrome.  He had relief with the injection last year up until about a week ago. -Counseled on home exercise therapy and supportive care. -Injection. -Could consider physical therapy or EMG.

## 2019-11-11 NOTE — Patient Instructions (Signed)
Good to see you Please try the exercises if they help at all.  Please send me a message in MyChart with any questions or updates.  Please see me back in 4-6 weeks or as needed if better.   --Dr. Raeford Razor

## 2019-11-11 NOTE — Progress Notes (Signed)
Manuel Petty - 74 y.o. male MRN [id number]  Date of birth: 10-09-1945  SUBJECTIVE:  Including CC & ROS.  Chief Complaint  Patient presents with  . Follow-up    right wrist    Manuel Petty is a 74 y.o. male that is presenting with acute worsening of his right carpal tunnel.  It is involving the right first 3 digits.  Seems to be worse at night.  He does not notice it through the course the day.   Review of Systems See HPI   HISTORY: Past Medical, Surgical, Social, and Family History Reviewed & Updated per EMR.   Pertinent Historical Findings include:  Past Medical History:  Diagnosis Date  . Diabetes mellitus ([place]) 2,000  . Erectile dysfunction   . HTN (hypertension)   . Hyperlipidemia   . Personal history of colonic adenomas 07/18/2009  . Renal cancer ([place])    1992, has 1 kidney ; does not see urology any more    Past Surgical History:  Procedure Laterality Date  . COLONOSCOPY    . NEPHRECTOMY  1992    Family History  Problem Relation Age of Onset  . Diabetes Father   . Hypertension Father   . CAD Father        F (MI age 57), Brother in [place] 31  . CAD Brother   . Prostate cancer Brother        dx age 22  . Cancer Brother        blood cancer   . Colon cancer Neg Hx   . Stroke Neg Hx     Social History   Socioeconomic History  . Marital status: Married    Spouse name: Not on file  . Number of children: 1  . Years of education: Not on file  . Highest education level: Not on file  Occupational History  . Occupation: retired   Tobacco Use  . Smoking status: Former Research scientist (life sciences)  . Smokeless tobacco: Never Used  . Tobacco comment: quit in 1992, 2 ppd  Substance and Sexual Activity  . Alcohol use: Yes    Alcohol/week: 2.0 standard drinks    Types: 2 Cans of beer per week  . Drug use: No    Comment: used to use marihuana    . Sexual activity: Not on file  Other Topics Concern  . Not on file  Social History Narrative   Lives w/ wife   Social  Determinants of Health   Financial Resource Strain:   . Difficulty of Paying Living Expenses:   Food Insecurity:   . Worried About Charity fundraiser in the Last Year:   . Arboriculturist in the Last Year:   Transportation Needs:   . Film/video editor (Medical):   Marland Kitchen Lack of Transportation (Non-Medical):   Physical Activity:   . Days of Exercise per Week:   . Minutes of Exercise per Session:   Stress:   . Feeling of Stress :   Social Connections:   . Frequency of Communication with Friends and Family:   . Frequency of Social Gatherings with Friends and Family:   . Attends Religious Services:   . Active Member of Clubs or Organizations:   . Attends Archivist Meetings:   Marland Kitchen Marital Status:   Intimate Partner Violence:   . Fear of Current or Ex-Partner:   . Emotionally Abused:   Marland Kitchen Physically Abused:   . Sexually Abused:      PHYSICAL  EXAM:  VS: BP (!) 157/71   Pulse 71   Ht 5\' 10"  (1.778 m)   Wt 170 lb (77.1 kg)   BMI 24.39 kg/m  Physical Exam Gen: NAD, alert, cooperative with exam, well-appearing MSK:  Right hand: No signs of atrophy. Normal grip strength. Positive Tinel's at the wrist. Neurovascular intact  Limited ultrasound: Right wrist:  Median nerve in the carpal tunnel was measured to be 0.10 cm  Summary: The median nerve was measured to be comparable to previous ultrasound from last year.  Ultrasound and interpretation by Clearance Coots, MD   Aspiration/Injection Procedure Note ESAI STECKLEIN Apr 14, 1946  Procedure: Injection Indications: Right carpal tunnel syndrome  Procedure Details Consent: Risks of procedure as well as the alternatives and risks of each were explained to the (patient/caregiver).  Consent for procedure obtained. Time Out: Verified patient identification, verified procedure, site/side was marked, verified correct patient position, special equipment/implants available, medications/allergies/relevent history  reviewed, required imaging and test results available.  Performed.  The area was cleaned with iodine and alcohol swabs.    The right carpal tunnel was injected using 1 cc's of 40 mg Depo-Medrol and 2 cc's of 0.25% bupivacaine with a 25 1 1/2" needle.  Ultrasound was used. Images were obtained in long views showing the injection.     A sterile dressing was applied.  Patient did tolerate procedure well.    ASSESSMENT & PLAN:   CARPAL TUNNEL SYNDROME, BILATERAL Worsening of the right carpal tunnel syndrome.  He had relief with the injection last year up until about a week ago. -Counseled on home exercise therapy and supportive care. -Injection. -Could consider physical therapy or EMG.

## 2019-12-18 ENCOUNTER — Other Ambulatory Visit: Payer: Self-pay | Admitting: Endocrinology

## 2019-12-23 ENCOUNTER — Other Ambulatory Visit (INDEPENDENT_AMBULATORY_CARE_PROVIDER_SITE_OTHER): Payer: PPO

## 2019-12-23 ENCOUNTER — Other Ambulatory Visit: Payer: Self-pay

## 2019-12-23 DIAGNOSIS — E119 Type 2 diabetes mellitus without complications: Secondary | ICD-10-CM | POA: Diagnosis not present

## 2019-12-23 DIAGNOSIS — E782 Mixed hyperlipidemia: Secondary | ICD-10-CM

## 2019-12-23 DIAGNOSIS — E1169 Type 2 diabetes mellitus with other specified complication: Secondary | ICD-10-CM | POA: Diagnosis not present

## 2019-12-23 DIAGNOSIS — Z794 Long term (current) use of insulin: Secondary | ICD-10-CM | POA: Diagnosis not present

## 2019-12-23 LAB — COMPREHENSIVE METABOLIC PANEL
ALT: 34 U/L (ref 0–53)
AST: 22 U/L (ref 0–37)
Albumin: 4.4 g/dL (ref 3.5–5.2)
Alkaline Phosphatase: 78 U/L (ref 39–117)
BUN: 17 mg/dL (ref 6–23)
CO2: 24 mEq/L (ref 19–32)
Calcium: 9 mg/dL (ref 8.4–10.5)
Chloride: 107 mEq/L (ref 96–112)
Creatinine, Ser: 0.85 mg/dL (ref 0.40–1.50)
GFR: 88.65 mL/min (ref >60.00)
Glucose, Bld: 121 mg/dL — ABNORMAL HIGH (ref 70–99)
Potassium: 4.3 mEq/L (ref 3.5–5.1)
Sodium: 140 mEq/L (ref 135–145)
Total Bilirubin: 0.5 mg/dL (ref 0.2–1.2)
Total Protein: 7 g/dL (ref 6.0–8.3)

## 2019-12-23 LAB — LIPID PANEL
Cholesterol: 99 mg/dL (ref 0–200)
HDL: 29.1 mg/dL — ABNORMAL LOW (ref >39.00)
LDL Cholesterol: 55 mg/dL (ref 0–99)
NonHDL: 70.24
Total CHOL/HDL Ratio: 3
Triglycerides: 77 mg/dL (ref 0.0–149.0)
VLDL: 15.4 mg/dL (ref 0.0–40.0)

## 2019-12-23 LAB — HEMOGLOBIN A1C: Hgb A1c MFr Bld: 6.8 % — ABNORMAL HIGH (ref 4.6–6.5)

## 2019-12-30 ENCOUNTER — Other Ambulatory Visit: Payer: Self-pay

## 2019-12-30 ENCOUNTER — Encounter: Payer: Self-pay | Admitting: Endocrinology

## 2019-12-30 ENCOUNTER — Ambulatory Visit (INDEPENDENT_AMBULATORY_CARE_PROVIDER_SITE_OTHER): Payer: PPO | Admitting: Endocrinology

## 2019-12-30 VITALS — BP 140/72 | HR 75 | Ht 70.0 in | Wt 170.8 lb

## 2019-12-30 DIAGNOSIS — I10 Essential (primary) hypertension: Secondary | ICD-10-CM

## 2019-12-30 DIAGNOSIS — E782 Mixed hyperlipidemia: Secondary | ICD-10-CM | POA: Diagnosis not present

## 2019-12-30 DIAGNOSIS — Z794 Long term (current) use of insulin: Secondary | ICD-10-CM | POA: Diagnosis not present

## 2019-12-30 DIAGNOSIS — E1165 Type 2 diabetes mellitus with hyperglycemia: Secondary | ICD-10-CM

## 2019-12-30 NOTE — Progress Notes (Signed)
Patient ID: Manuel Petty, male   DOB: [birth year], 74 y.o.   MRN: [id number]   Reason for Appointment: follow-up diabetes  History of Present Illness   Diagnosis: Type 2 DIABETES MELITUS, date of diagnosis: 2007   Previous history: He had been on large doses of Lantus before he was given Victoza in addition and this improved his blood sugar control and reduced his insulin requirement However he subsequently started requiring mealtime coverage with large meals also  RECENT history:  Insulin regimen: [employer] brand Regular Insulin  Using OMNIPOD insulin pump since 3/20  Basal settings: 0. 75 midnight--7 AM, 7 AM = 0.95, 12 noon-3 PM = 0.9.  3 PM = 0.75, 6 PM = 0.9 and 10 PM = 1.0  Recommended boluses breakfast 4-6  units, lunch and supper 6-8 units  Recently total daily insulin use about 36 units  Non-insulin hypoglycemic drugs: Metformin 1 g before breakfast  A1c is slightly higher at 6.8 compared to 6.6    Current blood sugar patterns and problems identified:  His freestyle libre version 2 appears to be accurate as judged by the simultaneous lab blood sugar although he still thinks that it is reading 20 to 30 mg lower at times  He will sometimes use fingersticks to enter in his pump  Since he was getting some low sugars overnight he himself reduced his midnight basal rates with benefit  HIGHEST blood sugars are late evening as discussed below in the CGM interpretation  Although he is trying to bolus 30 minutes before eating he can only do this sometimes  He has not done a lot of significant physical activity but usually if he has near normal blood sugars he can use temporary basal or suspend his pump when going out for activities like mowing the lawn  Weight is unchanged        Side effects from medications: None  Proper timing of medications in relation to meals: Yes.          Monitors blood glucose:  Several times a day.    Glucometer:  FreeStyle  Libre        CONTINUOUS GLUCOSE MONITORING RECORD INTERPRETATION    Dates of Recording: Last 2 weeks  Sensor description: Libre 2  Results statistics:   CGM use % of time  96  Average and SD  148, GV 26  Time in range     80   % was 95  % Time Above 180  19  % Time above 250 1  % Time Below target 0    PRE-MEAL Fasting Lunch Dinner Bedtime Overall  Glucose range:       Mean/median:  125  136  152  189  148   POST-MEAL PC Breakfast PC Lunch PC Dinner  Glucose range:     Mean/median:  136   186    Glycemic patterns summary:  Blood sugars are fairly stable throughout the day and LOWEST readings from 4-6 AM and highest 10 PM-12 AM Blood sugars are trending higher after 4 PM until at least midnight. No blood sugars below normal  Hyperglycemic episodes are occurring primarily in the evening with gradual increase in blood sugar after 4-5 PM Rarely higher after lunch  Hypoglycemic episodes are minimal with rare transient low normal readings early morning and 4-6 AM  Overnight periods: Blood sugars are decreasing after being averaging about 175 at midnight down to near normal levels at waking up time, occasionally may have dawn  phenomenon  Preprandial periods: Blood sugars are averaging about 125 at breakfast, usually not eating lunch  Has more variability in glucose at dinnertime but generally in the 150-170 range  Postprandial periods:   After breakfast: Blood sugars are very well controlled with minimal spike after the meal Usually not eating lunch, occasionally will have a reading over 200 if eating  After dinner: Blood sugars are gradually increasing with only a couple of evenings where he had a spike   Data from 4/21:   CGM use % of time  97  Average and SD  122  Time in range    95    %  % Time Above 180  4  % Time above 250   % Time Below target  1     Glycemic patterns summary: Blood sugars are likely still falsely low by up to 20 mg on comparison with  lab glucose However blood sugar variability is minimal throughout the day Highest blood sugars are after 10 PM for about 4 hours Lowest blood sugars are between 6-8 AM        CGM use % of time  97  Average and SD  129, GV 25  Time in range     92   %  % Time Above 180  7  % Time above 250   % Time Below target  1    PRE-MEAL Fasting Lunch Dinner Bedtime Overall  Glucose range:       Mean/median:  119   139  153 129   POST-MEAL PC Breakfast PC Lunch PC Dinner  Glucose range:     Mean/median:  129  139  154       Meals:2- 3 meals per day. Lunch 2-3 pm; dinner 6 pm.  For breakfast he will sometimes have English muffin and eggs otherwise only [place] bacon and egg beaters      Dietician visit: Most recent: 01/9241           Complications: are: None     Wt Readings from Last 3 Encounters:  12/30/19 170 lb 12.8 oz (77.5 kg)  11/11/19 170 lb (77.1 kg)  09/01/19 170 lb 12.8 oz (77.5 kg)      Lab Results  Component Value Date   HGBA1C 6.8 (H) 12/23/2019   HGBA1C 6.6 (H) 08/25/2019   HGBA1C 6.5 05/19/2019   Lab Results  Component Value Date   MICROALBUR 6.3 (H) 01/13/2019   LDLCALC 55 12/23/2019   CREATININE 0.85 12/23/2019    Other active problems discussed in review of systems   Allergies as of 12/30/2019   No Known Allergies     Medication List       Accurate as of December 30, 2019  9:01 PM. If you have any questions, ask your nurse or doctor.        aspirin 81 MG tablet Take 81 mg by mouth daily.   FreeStyle [place] 2 Reader Systm Devi 1 Device by Does not apply route daily.   FreeStyle Libre 2 Sensor Misc 1 each by Does not apply route every 14 (fourteen) days. Use one sensor once every 14 days to monitor blood sugar.   FreeStyle Precision Neo Test test strip Generic drug: glucose blood USE TO CHECK BLOOD SUGAR FOR UP TO 3 TIMES DAILY   insulin regular 100 units/mL injection Commonly known as: NovoLIN R Inject 0.68 mLs (68 Units total) into the  skin daily.   losartan 50 MG tablet Commonly known  as: COZAAR TAKE 1 TABLET BY MOUTH EVERY DAY   metFORMIN 1000 MG tablet Commonly known as: GLUCOPHAGE Take 1 tablet by mouth once daily   OmniPod Dash System Kit 1 each by Other route See admin instructions. Use continuously to administer insulin.   OmniPod Dash 5 Pack Pods Misc CHANGE DASH PODS EVERY 72 HOURS AS DIRECTED   pravastatin 20 MG tablet Commonly known as: PRAVACHOL TAKE 1 TABLET(20 MG) BY MOUTH DAILY       Allergies: No Known Allergies  Past Medical History:  Diagnosis Date  . Diabetes mellitus ([place]) 2,000  . Erectile dysfunction   . HTN (hypertension)   . Hyperlipidemia   . Personal history of colonic adenomas 07/18/2009  . Renal cancer ([place])    1992, has 1 kidney ; does not see urology any more    Past Surgical History:  Procedure Laterality Date  . COLONOSCOPY    . NEPHRECTOMY  1992    Family History  Problem Relation Age of Onset  . Diabetes Father   . Hypertension Father   . CAD Father        F (MI age 2), Brother in [place] 4  . CAD Brother   . Prostate cancer Brother        dx age 49  . Cancer Brother        blood cancer   . Colon cancer Neg Hx   . Stroke Neg Hx     Social History:  reports that he has quit smoking. He has never used smokeless tobacco. He reports current alcohol use of about 2.0 standard drinks of alcohol per week. He reports that he does not use drugs.  Review of Systems -     HYPERLIPIDEMIA:   Treated with pravastatin, had difficulty tolerating niacin  He previously had been on Slo-Niacin OTC 500 mg twice a day for low HDL   HDL has been in the 20s along with small LDL particles in the past, HDL slightly higher now  Particle number was below 1000 in 06/2014 with increased small particles    Lab Results  Component Value Date   CHOL 99 12/23/2019   HDL 29.10 (L) 12/23/2019   LDLCALC 55 12/23/2019   LDLDIRECT 45.7 05/31/2009   TRIG 77.0 12/23/2019   CHOLHDL  3 12/23/2019    He has mild hypertension, taking LOSARTAN 50 mg Previously potassium was high on lisinopril  He monitors BP at home and recently reading 125/68 Had initially high reading today  BP Readings from Last 3 Encounters:  12/30/19 140/72  11/11/19 (!) 157/71  09/01/19 124/60   Has stable potassium  Lab Results  Component Value Date   K 4.3 12/23/2019   Eye exams: Annual, last 03/2019, report not available   Diabetic foot exam was done in 01/2019   Examination:   BP 140/72 (BP Location: Left Arm, Patient Position: Sitting, Cuff Size: Normal)   Pulse 75   Ht _0  (1.778 m)   Wt 170 lb 12.8 oz (77.5 kg)   SpO2 97%   BMI 24.51 kg/m   Body mass index is 24.51 kg/m.   No ankle edema  Assesment/Plan:   Diabetes type 2, nonobese: See history of present illness for detailed discussion of his current blood sugar patterns, problems identified   A1c is 6.8  Currently using the OmniPod insulin with the Novolin regular insulin  He is having somewhat more hypoglycemia in the evenings with blood sugar in the target range 80% of  the time compared to 95 previously This is likely to be from more insulin resistance although his diet may be variable Usually not having significant mealtime spikes Also blood sugars are good overnight and no hypoglycemia at any time Only on Metformin because of concern about brand-name medications  Taking Metformin only in the morning because of tendency to low sugars previously when taking this in the evening   He will take 1 to 2 units bolus if he needs to treat the dawn phenomenon on some days  Try to check the coverage for lispro insulin since this will be better for control compared to regular insulin  Change basal rate at 3 PM up to 0.75  5 PM to 10 PM will be increased up to 1.15 and 10 PM-12 AM = 1.10  Call if consistently having high readings  Cover any snacks in the afternoons with boluses  Make sure he has protein at  dinnertime daily  HYPERTENSION: Has whitecoat increase today but otherwise good control especially with monitoring at home  LIPIDS: LDL well controlled but still has low HDL   Patient Instructions  Check Lispro insulin cost    Follow-up in 3 months   Robertta Halfhill 12/30/2019, 9:01 PM

## 2019-12-30 NOTE — Patient Instructions (Signed)
Check Lispro insulin cost

## 2020-01-01 ENCOUNTER — Other Ambulatory Visit: Payer: Self-pay | Admitting: Endocrinology

## 2020-01-03 ENCOUNTER — Encounter: Payer: Self-pay | Admitting: Internal Medicine

## 2020-01-10 ENCOUNTER — Other Ambulatory Visit: Payer: Self-pay | Admitting: Endocrinology

## 2020-02-09 ENCOUNTER — Other Ambulatory Visit: Payer: Self-pay | Admitting: Endocrinology

## 2020-03-07 ENCOUNTER — Encounter: Payer: Self-pay | Admitting: Internal Medicine

## 2020-03-07 ENCOUNTER — Ambulatory Visit (INDEPENDENT_AMBULATORY_CARE_PROVIDER_SITE_OTHER): Payer: PPO | Admitting: Internal Medicine

## 2020-03-07 ENCOUNTER — Other Ambulatory Visit: Payer: Self-pay

## 2020-03-07 VITALS — BP 155/79 | HR 69 | Temp 97.9°F | Resp 16 | Ht 70.0 in | Wt 170.2 lb

## 2020-03-07 DIAGNOSIS — Z0000 Encounter for general adult medical examination without abnormal findings: Secondary | ICD-10-CM | POA: Diagnosis not present

## 2020-03-07 NOTE — Patient Instructions (Addendum)
Happy belated Rudene Anda!   Per our records you are due for an eye exam. Please contact your eye doctor to schedule an appointment. Please have them send copies of your office visit notes to [place]. Our fax number is [phone].   Check the  blood pressure regularly BP GOAL is between 110/65 and  135/85. If it is consistently higher or lower, let me know  Proceed with your flu shot and Covid vaccination booster at your earliest [facility] : Get the blood work     [place], [place] back for a physical exam in 1 year

## 2020-03-07 NOTE — Progress Notes (Signed)
 Subjective:    Patient ID: Manuel Petty, male    DOB: 04/22/1946, 74 y.o.   MRN: [id number]  DOS:  03/07/2020 Type of visit - description: CPX  Since the last visit in 2019 he is doing well and has no major concerns today.  BP Readings from Last 3 Encounters:  03/07/20 (!) 155/79  12/30/19 140/72  11/11/19 (!) 157/71    Review of Systems  A 14 point review of systems is negative    Past Medical History:  Diagnosis Date  . Diabetes mellitus (HCC) 2,000  . Erectile dysfunction   . HTN (hypertension)   . Hyperlipidemia   . Personal history of colonic adenomas 07/18/2009  . Renal cancer (HCC)    1992, has 1 kidney ; does not see urology any more    Past Surgical History:  Procedure Laterality Date  . COLONOSCOPY    . NEPHRECTOMY  1992    Allergies as of 03/07/2020   No Known Allergies     Medication List       Accurate as of March 07, 2020 11:59 PM. If you have any questions, ask your nurse or doctor.        aspirin 81 MG tablet Take 81 mg by mouth daily.   FreeStyle Libre 2 Reader Systm Devi 1 Device by Does not apply route daily.   FreeStyle Libre 2 Sensor Misc USE 1 SENSOR ONCE EVERY 14 DAYS TO MONITOR BLOOD SUGAR   FreeStyle Precision Neo Test test strip Generic drug: glucose blood USE TO CHECK BLOOD SUGAR FOR UP TO 3 TIMES DAILY   insulin regular 100 units/mL injection Commonly known as: NovoLIN R Inject 0.68 mLs (68 Units total) into the skin daily.   losartan 50 MG tablet Commonly known as: COZAAR TAKE 1 TABLET BY MOUTH EVERY DAY   metFORMIN 1000 MG tablet Commonly known as: GLUCOPHAGE Take 1 tablet by mouth once daily   OmniPod Dash System Kit 1 each by Other route See admin instructions. Use continuously to administer insulin.   OmniPod Dash 5 Pack Pods Misc CHANGE DASH PODS EVERY 72 HOURS AS DIRECTED   pravastatin 20 MG tablet Commonly known as: PRAVACHOL TAKE 1 TABLET(20 MG) BY MOUTH DAILY          Objective:    Physical Exam BP (!) 155/79 (BP Location: Left Arm, Patient Position: Sitting, Cuff Size: Small)   Pulse 69   Temp 97.9 F (36.6 C) (Oral)   Resp 16   Ht 5' 10" (1.778 m)   Wt 170 lb 4 oz (77.2 kg)   SpO2 99%   BMI 24.43 kg/m  General: Well developed, NAD, BMI noted Neck: No  thyromegaly  HEENT:  Normocephalic . Face symmetric, atraumatic Lungs:  CTA B Normal respiratory effort, no intercostal retractions, no accessory muscle use. Heart: RRR,  no murmur.  Abdomen:  Not distended, soft, non-tender. No rebound or rigidity.   Lower extremities: no pretibial edema bilaterally  Skin: Exposed areas without rash. Not pale. Not jaundice DRE: Normal sphincter tone, no stools, prostate normal Neurologic:  alert & oriented X3.  Speech normal, gait appropriate for age and unassisted Strength symmetric and appropriate for age.  Psych: Cognition and judgment appear intact.  Cooperative with normal attention span and concentration.  Behavior appropriate. No anxious or depressed appearing.     Assessment    Assessment DM--dr Kumar HTN Hyperlipidemia. E.D. Renal cancer, 1992, nephrectomy x1,, released from urology   PLAN: Here for CPX DM: Last   visit with Endo 12/30/2019. HTN: On losartan, BP today slightly elevated, reports whitecoat syndrome, ambulatory BPs are all normal.  No change, checking labs Hyperlipidemia: On Pravachol, last LDL 55 Aspirin: Personal history of DM, HTN, hyperlipidemia and + FH CAD: For now we agreed on continue aspirin and reassess the issue yearly. RTC 1 year    This visit occurred during the SARS-CoV-2 public health emergency.  Safety protocols were in place, including screening questions prior to the visit, additional usage of staff PPE, and extensive cleaning of exam room while observing appropriate contact time as indicated for disinfecting solutions.  

## 2020-03-07 NOTE — Progress Notes (Signed)
Pre visit review using our clinic review tool, if applicable. No additional management support is needed unless otherwise documented below in the visit note. 

## 2020-03-08 ENCOUNTER — Encounter: Payer: Self-pay | Admitting: Internal Medicine

## 2020-03-08 LAB — CBC WITH DIFFERENTIAL/PLATELET
Absolute Monocytes: 537 cells/uL (ref 200–950)
Basophils Absolute: 43 cells/uL (ref 0–200)
Basophils Relative: 0.7 %
Eosinophils Absolute: 238 cells/uL (ref 15–500)
Eosinophils Relative: 3.9 %
HCT: 43.1 % (ref 38.5–50.0)
Hemoglobin: 14.4 g/dL (ref 13.2–17.1)
Lymphs Abs: 1269 cells/uL (ref 850–3900)
MCH: 33.1 pg — ABNORMAL HIGH (ref 27.0–33.0)
MCHC: 33.4 g/dL (ref 32.0–36.0)
MCV: 99.1 fL (ref 80.0–100.0)
MPV: 11.4 fL (ref 7.5–12.5)
Monocytes Relative: 8.8 %
Neutro Abs: 4014 cells/uL (ref 1500–7800)
Neutrophils Relative %: 65.8 %
Platelets: 287 10*3/uL (ref 140–400)
RBC: 4.35 10*6/uL (ref 4.20–5.80)
RDW: 12.8 % (ref 11.0–15.0)
Total Lymphocyte: 20.8 %
WBC: 6.1 10*3/uL (ref 3.8–10.8)

## 2020-03-08 LAB — PSA: PSA: 0.99 ng/mL (ref <=4.0)

## 2020-03-08 LAB — TSH: TSH: 0.82 mIU/L (ref 0.40–4.50)

## 2020-03-08 NOTE — Assessment & Plan Note (Signed)
Here for CPX DM: Last visit with Endo 12/30/2019. HTN: On losartan, BP today slightly elevated, reports whitecoat syndrome, ambulatory BPs are all normal.  No change, checking labs Hyperlipidemia: On Pravachol, last LDL 55 Aspirin: Personal history of DM, HTN, hyperlipidemia and + FH CAD: For now we agreed on continue aspirin and reassess the issue yearly. RTC 1 year

## 2020-03-08 NOTE — Assessment & Plan Note (Signed)
-   Td 09/2017 -  pneumonia shot 2016, prevnar 2016 - zostavax : 2012; s/p  shingrex  - s/p covid vax, plans to get a booster at the pharmacy -To get a flu shot at the pharmacy -- [facility] colonoscopy 3-11, multiple polyps, cscope 08-2016, DR Carlean Purl, next 5 years per GI letter -Prostate ca screening: brother dx w/ prostate ca age 72, DRE normal today, check a PSA. -Lif[facility]: [facility], walks.Diet is ok --Labs:  CBC, TSH, PSA

## 2020-03-09 DIAGNOSIS — E119 Type 2 diabetes mellitus without complications: Secondary | ICD-10-CM | POA: Diagnosis not present

## 2020-03-09 LAB — HM DIABETES EYE EXAM

## 2020-03-13 ENCOUNTER — Other Ambulatory Visit: Payer: Self-pay | Admitting: Endocrinology

## 2020-03-22 ENCOUNTER — Other Ambulatory Visit: Payer: Self-pay

## 2020-03-22 ENCOUNTER — Other Ambulatory Visit (INDEPENDENT_AMBULATORY_CARE_PROVIDER_SITE_OTHER): Payer: PPO

## 2020-03-22 DIAGNOSIS — E1165 Type 2 diabetes mellitus with hyperglycemia: Secondary | ICD-10-CM

## 2020-03-22 DIAGNOSIS — Z794 Long term (current) use of insulin: Secondary | ICD-10-CM | POA: Diagnosis not present

## 2020-03-22 LAB — COMPREHENSIVE METABOLIC PANEL
ALT: 36 U/L (ref 0–53)
AST: 22 U/L (ref 0–37)
Albumin: 4.4 g/dL (ref 3.5–5.2)
Alkaline Phosphatase: 78 U/L (ref 39–117)
BUN: 18 mg/dL (ref 6–23)
CO2: 25 mEq/L (ref 19–32)
Calcium: 8.7 mg/dL (ref 8.4–10.5)
Chloride: 105 mEq/L (ref 96–112)
Creatinine, Ser: 0.81 mg/dL (ref 0.40–1.50)
GFR: 88.36 mL/min (ref >60.00)
Glucose, Bld: 138 mg/dL — ABNORMAL HIGH (ref 70–99)
Potassium: 4.4 mEq/L (ref 3.5–5.1)
Sodium: 136 mEq/L (ref 135–145)
Total Bilirubin: 0.5 mg/dL (ref 0.2–1.2)
Total Protein: 7 g/dL (ref 6.0–8.3)

## 2020-03-22 LAB — MICROALBUMIN / CREATININE URINE RATIO
Creatinine,U: 74 mg/dL
Microalb Creat Ratio: 19.8 mg/g (ref 0.0–30.0)
Microalb, Ur: 14.7 mg/dL — ABNORMAL HIGH (ref 0.0–1.9)

## 2020-03-22 LAB — HEMOGLOBIN A1C: Hgb A1c MFr Bld: 6.9 % — ABNORMAL HIGH (ref 4.6–6.5)

## 2020-03-23 ENCOUNTER — Other Ambulatory Visit: Payer: Self-pay | Admitting: Endocrinology

## 2020-03-24 ENCOUNTER — Encounter: Payer: Self-pay | Admitting: Family Medicine

## 2020-03-24 ENCOUNTER — Other Ambulatory Visit: Payer: Self-pay

## 2020-03-24 ENCOUNTER — Ambulatory Visit: Payer: PPO | Admitting: Family Medicine

## 2020-03-24 ENCOUNTER — Ambulatory Visit: Payer: Self-pay

## 2020-03-24 VITALS — BP 139/77 | HR 66 | Ht 70.0 in | Wt 170.0 lb

## 2020-03-24 DIAGNOSIS — G5603 Carpal tunnel syndrome, bilateral upper limbs: Secondary | ICD-10-CM | POA: Diagnosis not present

## 2020-03-24 MED ORDER — METHYLPREDNISOLONE ACETATE 40 MG/ML IJ SUSP
40.0000 mg | Freq: Once | INTRAMUSCULAR | Status: DC
Start: 2020-03-24 — End: 2023-09-11

## 2020-03-24 MED ORDER — METHYLPREDNISOLONE ACETATE 40 MG/ML IJ SUSP
40.0000 mg | Freq: Once | INTRAMUSCULAR | Status: AC
Start: 2020-03-24 — End: 2020-03-24
  Administered 2020-03-24: 40 mg via INTRA_ARTICULAR

## 2020-03-24 NOTE — Progress Notes (Signed)
Manuel Petty - 74 y.o. male MRN [id number]  Date of birth: 1946/05/06  SUBJECTIVE:  Including CC & ROS.  Chief Complaint  Patient presents with  . Wrist Pain    left    Manuel Petty is a 73 y.o. male that is presenting with pain and altered sensation in the left palmar hand.  Symptoms been ongoing for the past 2 weeks.  Seems to be worse at night.  This feels similar to the right hand which has carpal tunnel..   Review of Systems See HPI   HISTORY: Past Medical, Surgical, Social, and Family History Reviewed & Updated per EMR.   Pertinent Historical Findings include:  Past Medical History:  Diagnosis Date  . Diabetes mellitus ([place]) 2,000  . Erectile dysfunction   . HTN (hypertension)   . Hyperlipidemia   . Personal history of colonic adenomas 07/18/2009  . Renal cancer ([place])    1992, has 1 kidney ; does not see urology any more    Past Surgical History:  Procedure Laterality Date  . COLONOSCOPY    . NEPHRECTOMY  1992    Family History  Problem Relation Age of Onset  . Diabetes Father   . Hypertension Father   . CAD Father        F (MI age 46), Brother in ho[place] 5  . Dementia Brother        parkinsons? dementia?  . Prostate cancer Brother        dx age 33  . Cancer Brother        blood cancer   . CAD Brother   . Colon cancer Neg Hx   . Stroke Neg Hx     Social History   Socioeconomic History  . Marital status: Married    Spouse name: Not on file  . Number of children: 1  . Years of education: Not on file  . Highest education level: Not on file  Occupational History  . Occupation: retired   Tobacco Use  . Smoking status: Former Research scientist (life sciences)  . Smokeless tobacco: Never Used  . Tobacco comment: quit in 1992, 2 ppd  Substance and Sexual Activity  . Alcohol use: Yes    Alcohol/week: 2.0 standard drinks    Types: 2 Cans of beer per week  . Drug use: No    Comment: used to use marihuana    . Sexual activity: Not on file  Other Topics Concern  .  Not on file  Social History Narrative   Lives w/ wife   Social Determinants of Health   Financial Resource Strain:   . Difficulty of Paying Living Expenses: Not on file  Food Insecurity:   . Worried About Charity fundraiser in the Last Year: Not on file  . Ran Out of Food in the Last Year: Not on file  Transportation Needs:   . Lack of Transportation (Medical): Not on file  . Lack of Transportation (Non-Medical): Not on file  Physical Activity:   . Days of Exercise per Week: Not on file  . Minutes of Exercise per Session: Not on file  Stress:   . Feeling of Stress : Not on file  Social Connections:   . Frequency of Communication with Friends and Family: Not on file  . Frequency of Social Gatherings with Friends and Family: Not on file  . Attends Religious Services: Not on file  . Active Member of Clubs or Organizations: Not on file  . Attends Archivist  Meetings: Not on file  . Marital Status: Not on file  Intimate Partner Violence:   . Fear of Current or Ex-Partner: Not on file  . Emotionally Abused: Not on file  . Physically Abused: Not on file  . Sexually Abused: Not on file     PHYSICAL EXAM:  VS: BP 139/77   Pulse 66   Ht 5\' 10"  (1.778 m)   Wt 170 lb (77.1 kg)   BMI 24.39 kg/m  Physical Exam Gen: NAD, alert, cooperative with exam, well-appearing MSK:  Left hand: No signs of atrophy. Normal grip strength. Normal pincer grasp. Neurovascular intact  Limited ultrasound: Left carpal tunnel:  Measured median nerve appears to the normal in circumference.  Does have some flattening on exam.  Summary: No significant structural changes of the median nerve in the carpal tunnel.  Ultrasound and interpretation by Clearance Coots, MD    Aspiration/Injection Procedure Note Manuel Petty May 26, 1945  Procedure: Injection Indications: Left carpal tunnel syndrome  Procedure Details Consent: Risks of procedure as well as the alternatives and risks  of each were explained to the (patient/caregiver).  Consent for procedure obtained. Time Out: Verified patient identification, verified procedure, site/side was marked, verified correct patient position, special equipment/implants available, medications/allergies/relevent history reviewed, required imaging and test results available.  Performed.  The area was cleaned with iodine and alcohol swabs.    The left carpal tunnel was injected using 1 cc's of 40 mg Depo-Medrol and 2 cc's of 0.25% bupivacaine with a 25 1 1/2" needle.  Ultrasound was used. Images were obtained in long views showing the injection.     A sterile dressing was applied.  Patient did tolerate procedure well.      ASSESSMENT & PLAN:   CARPAL TUNNEL SYNDROME, BILATERAL Symptoms are consistent with carpal tunnel.  Not as severe as the right.  It feels similar to the right side. -Counseled on home exercise therapy and supportive care. -Injection. -Could consider nerve study.

## 2020-03-24 NOTE — Patient Instructions (Signed)
Good to see you Please try the brace at night  Please try the exercises   Please send me a message in MyChart with any questions or updates.  Please see me back in 4 weeks or as needed.   --Dr. Raeford Razor

## 2020-03-24 NOTE — Assessment & Plan Note (Signed)
Symptoms are consistent with carpal tunnel.  Not as severe as the right.  It feels similar to the right side. -Counseled on home exercise therapy and supportive care. -Injection. -Could consider nerve study.

## 2020-03-29 ENCOUNTER — Ambulatory Visit: Payer: PPO | Admitting: Endocrinology

## 2020-03-29 ENCOUNTER — Other Ambulatory Visit: Payer: Self-pay

## 2020-03-29 ENCOUNTER — Encounter: Payer: Self-pay | Admitting: Endocrinology

## 2020-03-29 VITALS — BP 145/70 | HR 77 | Ht 70.0 in | Wt 172.2 lb

## 2020-03-29 DIAGNOSIS — I10 Essential (primary) hypertension: Secondary | ICD-10-CM

## 2020-03-29 DIAGNOSIS — Z794 Long term (current) use of insulin: Secondary | ICD-10-CM | POA: Diagnosis not present

## 2020-03-29 DIAGNOSIS — E1165 Type 2 diabetes mellitus with hyperglycemia: Secondary | ICD-10-CM | POA: Diagnosis not present

## 2020-03-29 NOTE — Progress Notes (Signed)
Patient ID: Manuel Petty, male   DOB: 03/13/1946, 74 y.o.   MRN: [id number]   Reason for Appointment: follow-up diabetes  History of Present Illness   Diagnosis: Type 2 DIABETES MELITUS, date of diagnosis: 2007   Previous history: He had been on large doses of Lantus before he was given Victoza in addition and this improved his blood sugar control and reduced his insulin requirement However he subsequently started requiring mealtime coverage with large meals also  RECENT history:  Insulin regimen: [employer] brand Regular Insulin  Using OMNIPOD insulin pump since 3/20  Basal settings: 0. 75 midnight--7 AM, 7 AM = 0.95, 12 noon-3 PM = 0.9.  3 PM = 0.75, p.m. = 1.15 and 10 PM = 1.1  Recommended boluses breakfast 4-6  units, lunch and supper 6-8 units  Recently total daily insulin use about 36 units  Non-insulin hypoglycemic drugs: Metformin 1 g before breakfast  A1c is slightly higher at 6.9   Current blood sugar patterns and problems identified:  His freestyle libre version 2 appears to be variably accurate at home and sometimes his fingerstick readings are discordant  He feels that sometimes a freestyle libre reads higher than the actual blood sugar  Blood sugar patterns from his CGM download indicate the following    Overall glycemic patterns indicate stable to rising blood sugars overnight with some tendency to progressive rise until late evening  Although hypoglycemia has been present inconsistently after meals but more so since 11/11 after his steroid injection  Overnight blood sugars are variable but lowest around 3 AM and tending to rise after that  Postprandial readings are generally well controlled with usually no excessive increase over baseline  Highest sugars are averaging 183 between 6-8 PM  Hypoglycemia has been minimal   He is trying to do walking at least twice a week  As before he is trying to reduce carbohydrate intake  Usually  mealtime insulin is fixed doses between 4-7 units  He will sometimes adjust his basal rate on his own but not recently Also sugars were higher with getting a steroid injection last Thursday      Side effects from medications: None  Proper timing of medications in relation to meals: Yes.          Monitors blood glucose:  Several times a day.    Glucometer:  FreeStyle Libre       CGM use % of time  90  2-week average/SD  153, GV 27  Time in range  79     %  % Time Above 180  19  % Time above 250  2  % Time Below 70  0     PRE-MEAL Fasting Lunch Dinner Bedtime Overall  Glucose range:       Averages:  132  144  169  172    POST-MEAL PC Breakfast PC Lunch PC Dinner  Glucose range:     Averages:  137  161  183    Previous data:   CGM use % of time  96  Average and SD  148, GV 26  Time in range     80   % was 95  % Time Above 180  19  % Time above 250 1  % Time Below target 0    PRE-MEAL Fasting Lunch Dinner Bedtime Overall  Glucose range:       Mean/median:  125  136  152  189  148   POST-MEAL  PC Breakfast PC Lunch PC Dinner  Glucose range:     Mean/median:  136   186       Meals:2- 3 meals per day. Lunch 2-3 pm; dinner 6 pm.  For breakfast he will sometimes have English muffin and eggs otherwise only [place] bacon and egg beaters      Dietician visit: Most recent: 09/7844           Complications: are: None     Wt Readings from Last 3 Encounters:  03/29/20 172 lb 3.2 oz (78.1 kg)  03/24/20 170 lb (77.1 kg)  03/07/20 170 lb 4 oz (77.2 kg)      Lab Results  Component Value Date   HGBA1C 6.9 (H) 03/22/2020   HGBA1C 6.8 (H) 12/23/2019   HGBA1C 6.6 (H) 08/25/2019   Lab Results  Component Value Date   MICROALBUR 14.7 (H) 03/22/2020   LDLCALC 55 12/23/2019   CREATININE 0.81 03/22/2020    Other active problems discussed in review of systems   Allergies as of 03/29/2020   No Known Allergies     Medication List       Accurate as of March 29, 2020   2:25 PM. If you have any questions, ask your nurse or doctor.        aspirin 81 MG tablet Take 81 mg by mouth daily.   FreeStyle [place] 2 Reader Systm Devi 1 Device by Does not apply route daily.   FreeStyle Libre 2 Sensor Misc USE 1 SENSOR ONCE EVERY 14 DAYS TO MONITOR BLOOD SUGAR   FreeStyle Precision Neo Test test strip Generic drug: glucose blood USE TO CHECK BLOOD SUGAR FOR UP TO 3 TIMES DAILY   insulin regular 100 units/mL injection Commonly known as: NovoLIN R Inject 0.68 mLs (68 Units total) into the skin daily.   losartan 50 MG tablet Commonly known as: COZAAR TAKE 1 TABLET BY MOUTH EVERY DAY   metFORMIN 1000 MG tablet Commonly known as: GLUCOPHAGE Take 1 tablet by mouth once daily   OmniPod Dash System Kit 1 each by Other route See admin instructions. Use continuously to administer insulin.   OmniPod Dash 5 Pack Pods Misc CHANGE DASH PODS EVERY 72 HOURS AS DIRECTED   pravastatin 20 MG tablet Commonly known as: PRAVACHOL TAKE 1 TABLET(20 MG) BY MOUTH DAILY       Allergies: No Known Allergies  Past Medical History:  Diagnosis Date  . Diabetes mellitus ([place]) 2,000  . Erectile dysfunction   . HTN (hypertension)   . Hyperlipidemia   . Personal history of colonic adenomas 07/18/2009  . Renal cancer ([place])    1992, has 1 kidney ; does not see urology any more    Past Surgical History:  Procedure Laterality Date  . COLONOSCOPY    . NEPHRECTOMY  1992    Family History  Problem Relation Age of Onset  . Diabetes Father   . Hypertension Father   . CAD Father        F (MI age 85), Brother in ho[place] 21  . Dementia Brother        parkinsons? dementia?  . Prostate cancer Brother        dx age 71  . Cancer Brother        blood cancer   . CAD Brother   . Colon cancer Neg Hx   . Stroke Neg Hx     Social History:  reports that he has quit smoking. He has never used smokeless tobacco.  He reports current alcohol use of about 2.0 standard drinks of alcohol  per week. He reports that he does not use drugs.  Review of Systems -     HYPERLIPIDEMIA:   Treated with pravastatin, had difficulty tolerating niacin  He previously had been on Slo-Niacin OTC 500 mg twice a day for low HDL   HDL has been in the 20s along with small LDL particles in the past,  Particle number was below 1000 in 06/2014 with increased small particles    Lab Results  Component Value Date   CHOL 99 12/23/2019   HDL 29.10 (L) 12/23/2019   LDLCALC 55 12/23/2019   LDLDIRECT 45.7 05/31/2009   TRIG 77.0 12/23/2019   CHOLHDL 3 12/23/2019    He has mild hypertension, taking LOSARTAN 50 mg Previously potassium was high on lisinopril  He monitors BP at home and recently reading 130 average /68 Usually his first blood pressure reading in the office is high  BP Readings from Last 3 Encounters:  03/29/20 (!) 145/70  03/24/20 139/77  03/07/20 (!) 155/79   Has stable potassium  Lab Results  Component Value Date   K 4.4 03/22/2020   Eye exams: Annual, last 03/2019, report not available   Diabetic foot exam was done in 01/2019   Examination:   BP (!) 145/70   Pulse 77   Ht _0  (1.778 m)   Wt 172 lb 3.2 oz (78.1 kg)   SpO2 97%   BMI 24.71 kg/m   Body mass index is 24.71 kg/m.   No ankle edema  Assesment/Plan:   Diabetes type 2, nonobese: See history of present illness for detailed discussion of his current blood sugar patterns, problems identified   A1c is 6.8  Currently using the OmniPod insulin with the Novolin regular insulin  He is generally having fairly good blood sugars but his CGM indicates gradually increasing blood sugars between early morning till bedtime Also sugars were higher with getting a steroid injection last Thursday He feels that his CGM is reading falsely high periodically and needs to check with fingersticks more frequently now Postprandial readings are generally controlled  Recommendations:  He will take additional 1 to 2  units bolus if blood sugars are high Premeal  We will increase his basal rate from 6 PM after midnight to a setting of 1.30  May need to use temporary basal if his blood sugars are higher with steroids or illness  Try to check the coverage for lispro insulin since this should be generic  Continue basic coverage for carbohydrates as before for mealtimes  He will check the coverage for Dexcom as this is likely going to be more accurate  HYPERTENSION: Well-controlled especially at home Tends to have whitecoat increase in the office  Microalbumin normal   Patient Instructions  6 pm to 12 am = 1.30  Check Dexcom sensor    check the coverage for lispro insulin     Follow-up in 3 months   Arletta Lumadue 03/29/2020, 2:25 PM

## 2020-03-29 NOTE — Patient Instructions (Signed)
6 pm to 12 am = 1.30  Check Dexcom sensor    check the coverage for lispro insulin

## 2020-04-13 ENCOUNTER — Other Ambulatory Visit: Payer: Self-pay | Admitting: *Deleted

## 2020-04-13 ENCOUNTER — Encounter: Payer: Self-pay | Admitting: Family Medicine

## 2020-04-13 MED ORDER — METFORMIN HCL 1000 MG PO TABS
1000.0000 mg | ORAL_TABLET | Freq: Every day | ORAL | 1 refills | Status: DC
Start: 2020-04-13 — End: 2020-10-23

## 2020-05-05 ENCOUNTER — Other Ambulatory Visit: Payer: Self-pay | Admitting: Endocrinology

## 2020-06-11 ENCOUNTER — Other Ambulatory Visit: Payer: Self-pay | Admitting: Endocrinology

## 2020-06-18 ENCOUNTER — Other Ambulatory Visit: Payer: Self-pay | Admitting: Endocrinology

## 2020-07-01 ENCOUNTER — Other Ambulatory Visit: Payer: Self-pay | Admitting: Endocrinology

## 2020-07-06 ENCOUNTER — Other Ambulatory Visit (INDEPENDENT_AMBULATORY_CARE_PROVIDER_SITE_OTHER): Payer: PPO

## 2020-07-06 ENCOUNTER — Other Ambulatory Visit: Payer: Self-pay

## 2020-07-06 DIAGNOSIS — E1165 Type 2 diabetes mellitus with hyperglycemia: Secondary | ICD-10-CM

## 2020-07-06 DIAGNOSIS — Z794 Long term (current) use of insulin: Secondary | ICD-10-CM

## 2020-07-06 LAB — BASIC METABOLIC PANEL
BUN: 13 mg/dL (ref 6–23)
CO2: 27 mEq/L (ref 19–32)
Calcium: 9.2 mg/dL (ref 8.4–10.5)
Chloride: 105 mEq/L (ref 96–112)
Creatinine, Ser: 0.78 mg/dL (ref 0.40–1.50)
GFR: 89.19 mL/min (ref >60.00)
Glucose, Bld: 147 mg/dL — ABNORMAL HIGH (ref 70–99)
Potassium: 4.2 mEq/L (ref 3.5–5.1)
Sodium: 139 mEq/L (ref 135–145)

## 2020-07-06 LAB — HEMOGLOBIN A1C: Hgb A1c MFr Bld: 6.8 % — ABNORMAL HIGH (ref 4.6–6.5)

## 2020-07-13 ENCOUNTER — Ambulatory Visit: Payer: PPO | Admitting: Endocrinology

## 2020-07-13 ENCOUNTER — Encounter: Payer: Self-pay | Admitting: Endocrinology

## 2020-07-13 ENCOUNTER — Other Ambulatory Visit: Payer: Self-pay

## 2020-07-13 VITALS — BP 140/68 | HR 70 | Ht 70.0 in | Wt 178.0 lb

## 2020-07-13 DIAGNOSIS — I10 Essential (primary) hypertension: Secondary | ICD-10-CM | POA: Diagnosis not present

## 2020-07-13 DIAGNOSIS — E1165 Type 2 diabetes mellitus with hyperglycemia: Secondary | ICD-10-CM | POA: Diagnosis not present

## 2020-07-13 DIAGNOSIS — E782 Mixed hyperlipidemia: Secondary | ICD-10-CM

## 2020-07-13 DIAGNOSIS — Z794 Long term (current) use of insulin: Secondary | ICD-10-CM

## 2020-07-13 NOTE — Progress Notes (Signed)
Patient ID: Manuel Petty, male   DOB: 03-06-1946, 75 y.o.   MRN: [id number]   Reason for Appointment: follow-up diabetes  History of Present Illness   Diagnosis: Type 2 DIABETES MELITUS, date of diagnosis: 2007   Previous history: He had been on large doses of Lantus before he was given Victoza in addition and this improved his blood sugar control and reduced his insulin requirement However he subsequently started requiring mealtime coverage with large meals also  RECENT history:  Insulin regimen: [employer] brand Regular Insulin  Using OMNIPOD insulin pump since 3/20  Basal settings: 0. 75 midnight--7 AM, 7 AM = 0.95, 12 noon-3 PM = 0.9.  3 PM = 0.75, p.m. = 1.15 and 10 PM = 1.1  Recommended boluses breakfast 4-6  units, lunch and supper 6-8 units  Recently total daily insulin use about 51 units  Non-insulin hypoglycemic drugs: Metformin 1 g before breakfast  A1c is fairly consistent at 6.8-6.9   Current blood sugar patterns and problems identified:  His freestyle libre version 2 is still  variably accurate at home, sometimes his readings are higher than the actual blood sugar either from fingerstick or the lab glucose the same day  However is not able to get adequate coverage for Dexcom through his company  Is having to do some correction boluses around bedtime periodically for high readings and this may occasionally end up causing low sugars after midnight  Currently not entering the actual blood sugar or carbohydrates at the time of boluses and only the amount of boluses that he thinks he needs for his food  He said that if his blood sugar is below 100 he will not take a bolus for the food  Again with regular insulin he is trying to bolus about 30 minutes before eating  He has gained weight and has been generally less active with winter  He still has settings in his bolus for different targets at different times his active insulin time is only 2  hours   Blood sugar patterns from his CGM download indicate the following    Blood sugars are generally well controlled and on an average good target throughout the day and night  LOWEST blood sugars are around 2 AM and highest around 10 PM  Overnight blood sugars appear to be decreasing after 11 PM including some hypoglycemia at 1 AM-2 PM or 7 AM; otherwise they are averaging about 130 most of the night  HYPERGLYCEMIC episodes occur variably after lunch and more frequently late evening at variable times  POSTPRANDIAL blood sugars are rising about 40 mg after lunch and 30 mg after dinner on an average  However blood sugars frequently tend to rise further after 8-9 PM and occasionally for early part of the night  Hypoglycemia is only occasionally present overnight as above      Side effects from medications: None  Proper timing of medications in relation to meals: Yes.          Monitors blood glucose:  Several times a day.    Glucometer:  FreeStyle Libre        CGM use % of time  96  2-week average/GV  148  Time in range     80   %  % Time Above 180  17+1  % Time above 250   % Time Below 70  2     PRE-MEAL Fasting Lunch Dinner Bedtime Overall  Glucose range:  Averages:  134   143  175  148   POST-MEAL PC Breakfast PC Lunch PC Dinner  Glucose range:     Averages:  163   148    Previously:   CGM use % of time  90  2-week average/SD  153, GV 27  Time in range  79     %  % Time Above 180  19  % Time above 250  2  % Time Below 70  0     PRE-MEAL Fasting Lunch Dinner Bedtime Overall  Glucose range:       Averages:  132  144  169  172    POST-MEAL PC Breakfast PC Lunch PC Dinner  Glucose range:     Averages:  137  161  183         Meals:2- 3 meals per day. Lunch 2-3 pm; dinner 6 pm.  For breakfast he will sometimes have English muffin and eggs otherwise only [place] bacon and egg beaters      Dietician visit: Most recent: 12/1273            Complications: are: None     Wt Readings from Last 3 Encounters:  07/13/20 178 lb (80.7 kg)  03/29/20 172 lb 3.2 oz (78.1 kg)  03/24/20 170 lb (77.1 kg)      Lab Results  Component Value Date   HGBA1C 6.8 (H) 07/06/2020   HGBA1C 6.9 (H) 03/22/2020   HGBA1C 6.8 (H) 12/23/2019   Lab Results  Component Value Date   MICROALBUR 14.7 (H) 03/22/2020   LDLCALC 55 12/23/2019   CREATININE 0.78 07/06/2020    Other active problems discussed in review of systems   Allergies as of 07/13/2020   No Known Allergies     Medication List       Accurate as of July 13, 2020 11:59 PM. If you have any questions, ask your nurse or doctor.        STOP taking these medications   aspirin 81 MG tablet Stopped by: Elayne Snare, MD     TAKE these medications   FreeStyle Libre 2 Reader Systm Devi 1 Device by Does not apply route daily.   FreeStyle Libre 2 Sensor Misc USE 1 SENSOR ONCE EVERY 14 DAYS TO MONITOR BLOOD SUGAR   FreeStyle Precision Neo Test test strip Generic drug: glucose blood USE TO CHECK BLOOD SUGAR FOR UP TO 3 TIMES DAILY   insulin regular 100 units/mL injection Commonly known as: NovoLIN R Inject 0.68 mLs (68 Units total) into the skin daily.   losartan 50 MG tablet Commonly known as: COZAAR TAKE 1 TABLET BY MOUTH EVERY DAY   metFORMIN 1000 MG tablet Commonly known as: GLUCOPHAGE Take 1 tablet (1,000 mg total) by mouth daily.   OmniPod Dash System Kit 1 each by Other route See admin instructions. Use continuously to administer insulin.   OmniPod Dash 5 Pack Pods Misc CHANGE DASH POD EVERY 72 HOURS AS DIRECTED   pravastatin 20 MG tablet Commonly known as: PRAVACHOL TAKE 1 TABLET(20 MG) BY MOUTH DAILY       Allergies: No Known Allergies  Past Medical History:  Diagnosis Date  . Diabetes mellitus ([place]) 2,000  . Erectile dysfunction   . HTN (hypertension)   . Hyperlipidemia   . Personal history of colonic adenomas 07/18/2009  . Renal cancer ([place])     1992, has 1 kidney ; does not see urology any more    Past Surgical History:  Procedure Laterality Date  . COLONOSCOPY    . NEPHRECTOMY  1992    Family History  Problem Relation Age of Onset  . Diabetes Father   . Hypertension Father   . CAD Father        F (MI age 81), Brother in ho[place] 22  . Dementia Brother        parkinsons? dementia?  . Prostate cancer Brother        dx age 48  . Cancer Brother        blood cancer   . CAD Brother   . Colon cancer Neg Hx   . Stroke Neg Hx     Social History:  reports that he has quit smoking. He has never used smokeless tobacco. He reports current alcohol use of about 2.0 standard drinks of alcohol per week. He reports that he does not use drugs.  Review of Systems -     HYPERLIPIDEMIA:   Treated with pravastatin, had difficulty tolerating niacin  He previously had been on Slo-Niacin OTC 500 mg twice a day for low HDL   HDL has been in the 20s along with small LDL particles in the past,  Particle number was below 1000 in 06/2014 with increased small particles    Lab Results  Component Value Date   CHOL 99 12/23/2019   HDL 29.10 (L) 12/23/2019   LDLCALC 55 12/23/2019   LDLDIRECT 45.7 05/31/2009   TRIG 77.0 12/23/2019   CHOLHDL 3 12/23/2019    He has mild hypertension, taking LOSARTAN 50 mg Previously potassium was high on lisinopril  He monitors BP at home and recently reading 127         average /68 Usually his first blood pressure reading in the office is high  BP Readings from Last 3 Encounters:  07/13/20 140/68  03/29/20 (!) 145/70  03/24/20 139/77   Has stable potassium  Lab Results  Component Value Date   K 4.2 07/06/2020   Eye exams: Annual, last 03/2019, report not available   Diabetic foot exam was done in 01/2019   Examination:   BP 140/68   Pulse 70   Ht 5' 10"  (1.778 m)   Wt 178 lb (80.7 kg)   SpO2 98%   BMI 25.54 kg/m   Body mass index is 25.54 kg/m.   No ankle edema  Diabetic Foot Exam  - Simple   Simple Foot Form Diabetic Foot exam was performed with the following findings: Yes   Visual Inspection No deformities, no ulcerations, no other skin breakdown bilaterally: Yes Sensation Testing Intact to touch and monofilament testing bilaterally: Yes Pulse Check See comments: Yes Comments Dorsalis pedis pulses not palpable      Assesment/Plan:   Diabetes type 2, nonobese: See history of present illness for detailed discussion of his current blood sugar patterns, problems identified   A1c is 6.8  Currently using the OmniPod insulin with Novolin regular insulin and Metformin in the morning  Although his freestyle Elenor Legato is variably accurate and sometimes reading higher than actual blood sugars it indicates his blood sugars are in target range 80% of the time Postprandial blood sugars are generally well controlled even with regular insulin He is however having some tendency to gradually increasing blood sugars between morning and bedtime especially around 9-10 PM With correction doses he will also drop low overnight at times Currently not doing formal exercise but likely will get more active with spring coming  Recommendations:  He will need to  make the following changes with his basal rates  8 PM-12 AM = 1.4, sensitivity 1: 60 from 10 PM-2 AM otherwise 1: 50  Try to get Humalog insulin throughout the insulin affordability website from daily  Enter blood sugar readings and actual carbohydrates consistently with every bolus for more accurate bolusing  Avoid skipping boluses when blood sugars are normal  No change in Metformin as yet  Look into the OmniPod 5 insulin pump if available and affordable this year  HYPERTENSION: Well-controlled  Regular foot exams, currently for exam indicates no signs of neuropathy or vascular disease   Patient Instructions  [email or web address] for Humalog       Follow-up in 3 months   Eulogio Requena 07/14/2020, 9:15 AM

## 2020-07-13 NOTE — Patient Instructions (Signed)
[email or web address] for Humalog

## 2020-08-01 ENCOUNTER — Other Ambulatory Visit: Payer: Self-pay | Admitting: Endocrinology

## 2020-08-15 ENCOUNTER — Other Ambulatory Visit: Payer: Self-pay

## 2020-08-15 ENCOUNTER — Ambulatory Visit: Payer: PPO | Admitting: Family Medicine

## 2020-08-15 ENCOUNTER — Encounter: Payer: Self-pay | Admitting: Family Medicine

## 2020-08-15 ENCOUNTER — Ambulatory Visit: Payer: Self-pay

## 2020-08-15 VITALS — BP 140/80 | Ht 70.0 in | Wt 178.0 lb

## 2020-08-15 DIAGNOSIS — G5601 Carpal tunnel syndrome, right upper limb: Secondary | ICD-10-CM

## 2020-08-15 MED ORDER — METHYLPREDNISOLONE ACETATE 40 MG/ML IJ SUSP
40.0000 mg | Freq: Once | INTRAMUSCULAR | Status: AC
  Administered 2020-08-15: 40 mg via INTRA_ARTICULAR

## 2020-08-15 NOTE — Progress Notes (Signed)
Manuel Petty - 75 y.o. male MRN [id number]  Date of birth: 03/12/1946  SUBJECTIVE:  Including CC & ROS.  No chief complaint on file.   Manuel Petty is a 75 y.o. male that is presenting with acute on chronic right wrist pain.  He has had multiple injections in the past that have alleviated his symptoms.  He has been more active recently and symptoms seem to be worsening.  No injury or inciting event.   Review of Systems See HPI   HISTORY: Past Medical, Surgical, Social, and Family History Reviewed & Updated per EMR.   Pertinent Historical Findings include:  Past Medical History:  Diagnosis Date  . Diabetes mellitus ([place]) 2,000  . Erectile dysfunction   . HTN (hypertension)   . Hyperlipidemia   . Personal history of colonic adenomas 07/18/2009  . Renal cancer ([place])    1992, has 1 kidney ; does not see urology any more    Past Surgical History:  Procedure Laterality Date  . COLONOSCOPY    . NEPHRECTOMY  1992    Family History  Problem Relation Age of Onset  . Diabetes Father   . Hypertension Father   . CAD Father        F (MI age 60), Brother in [place] 34  . Dementia Brother        parkinsons? dementia?  . Prostate cancer Brother        dx age 33  . Cancer Brother        blood cancer   . CAD Brother   . Colon cancer Neg Hx   . Stroke Neg Hx     Social History   Socioeconomic History  . Marital status: Married    Spouse name: Not on file  . Number of children: 1  . Years of education: Not on file  . Highest education level: Not on file  Occupational History  . Occupation: retired   Tobacco Use  . Smoking status: Former Research scientist (life sciences)  . Smokeless tobacco: Never Used  . Tobacco comment: quit in 1992, 2 ppd  Substance and Sexual Activity  . Alcohol use: Yes    Alcohol/week: 2.0 standard drinks    Types: 2 Cans of beer per week  . Drug use: No    Comment: used to use marihuana    . Sexual activity: Not on file  Other Topics Concern  . Not on file   Social History Narrative   Lives w/ wife   Social Determinants of Health   Financial Resource Strain: Not on file  Food Insecurity: Not on file  Transportation Needs: Not on file  Physical Activity: Not on file  Stress: Not on file  Social Connections: Not on file  Intimate Partner Violence: Not on file     PHYSICAL EXAM:  VS: BP 140/80 (BP Location: Left Arm, Patient Position: Sitting, Cuff Size: Normal)   Ht 5\' 10"  (1.778 m)   Wt 178 lb (80.7 kg)   BMI 25.54 kg/m  Physical Exam Gen: NAD, alert, cooperative with exam, well-appearing MSK:  Right wrist: Normal range of motion. Normal strength resistance. Positive Tinel's. Neurovascular intact   Aspiration/Injection Procedure Note Manuel Petty 02/14/1946  Procedure: Injection Indications: Right carpal tunnel  Procedure Details Consent: Risks of procedure as well as the alternatives and risks of each were explained to the (patient/caregiver).  Consent for procedure obtained. Time Out: Verified patient identification, verified procedure, site/side was marked, verified correct patient position, special equipment/implants available, medications/allergies/relevent  history reviewed, required imaging and test results available.  Performed.  The area was cleaned with iodine and alcohol swabs.    The right carpal tunnel was injected using 1 cc's of 40 mg Depo-Medrol and 1 cc's of 0.25% bupivacaine with a 25 1 1/2" needle.  The needle was observed at its final destination for the [facility] dissection of the median nerve within the carpal tunnel.  Ultrasound was used. Images were obtained in long views showing the injection.     A sterile dressing was applied.  Patient did tolerate procedure well.     ASSESSMENT & PLAN:   CARPAL TUNNEL SYNDROME, BILATERAL Acute on chronic in nature. Has changes observed on long axis view.  - counseled on home exercise therapy and supportive  - injection  - could consider surgery

## 2020-08-15 NOTE — Assessment & Plan Note (Signed)
Acute on chronic in nature. Has changes observed on long axis view.  - counseled on home exercise therapy and supportive  - injection  - could consider surgery

## 2020-08-15 NOTE — Patient Instructions (Signed)
Good to see you Please try to brace as needed   Please send me a message in MyChart with any questions or updates.  Please see me back in 4 weeks or as needed if better.   --Dr. Raeford Razor

## 2020-08-31 ENCOUNTER — Ambulatory Visit: Payer: PPO | Attending: Internal Medicine

## 2020-08-31 DIAGNOSIS — Z23 Encounter for immunization: Secondary | ICD-10-CM

## 2020-08-31 NOTE — Progress Notes (Signed)
   Covid-19 Vaccination Clinic  Name:  Manuel Petty    MRN: [id number] DOB: 1945-11-04  08/31/2020  Manuel Petty was observed post Covid-19 immunization for 15 minutes without incident. He was provided with Vaccine Information Sheet and instruction to access the V-Safe system.   Manuel Petty was instructed to call 911 with any severe reactions post vaccine: Marland Kitchen Difficulty breathing  . Swelling of face and throat  . A fast heartbeat  . A bad rash all over body  . Dizziness and weakness   Immunizations Administered    Name Date Dose VIS Date Route   PFIZER Comrnaty(Gray TOP) Covid-19 Vaccine 08/31/2020  1:18 PM 0.3 mL 04/21/2020 Intramuscular   Manufacturer: [place]   Lot: [id number]   NDC: [phone]

## 2020-09-02 ENCOUNTER — Other Ambulatory Visit (HOSPITAL_BASED_OUTPATIENT_CLINIC_OR_DEPARTMENT_OTHER): Payer: Self-pay

## 2020-09-02 MED ORDER — PFIZER-BIONT COVID-19 VAC-TRIS 30 MCG/0.3ML IM SUSP
INTRAMUSCULAR | 0 refills | Status: DC
  Filled 2020-09-02: qty 0.3, 1d supply, fill #0

## 2020-09-19 ENCOUNTER — Encounter: Payer: Self-pay | Admitting: Internal Medicine

## 2020-10-23 ENCOUNTER — Other Ambulatory Visit: Payer: Self-pay | Admitting: Endocrinology

## 2020-11-18 ENCOUNTER — Other Ambulatory Visit: Payer: Self-pay

## 2020-11-18 ENCOUNTER — Other Ambulatory Visit (INDEPENDENT_AMBULATORY_CARE_PROVIDER_SITE_OTHER): Payer: PPO

## 2020-11-18 DIAGNOSIS — E782 Mixed hyperlipidemia: Secondary | ICD-10-CM | POA: Diagnosis not present

## 2020-11-18 DIAGNOSIS — Z794 Long term (current) use of insulin: Secondary | ICD-10-CM

## 2020-11-18 DIAGNOSIS — E1165 Type 2 diabetes mellitus with hyperglycemia: Secondary | ICD-10-CM | POA: Diagnosis not present

## 2020-11-18 LAB — COMPREHENSIVE METABOLIC PANEL
ALT: 48 U/L (ref 0–53)
AST: 31 U/L (ref 0–37)
Albumin: 4.6 g/dL (ref 3.5–5.2)
Alkaline Phosphatase: 74 U/L (ref 39–117)
BUN: 17 mg/dL (ref 6–23)
CO2: 26 mEq/L (ref 19–32)
Calcium: 9.4 mg/dL (ref 8.4–10.5)
Chloride: 104 mEq/L (ref 96–112)
Creatinine, Ser: 0.9 mg/dL (ref 0.40–1.50)
GFR: 85.2 mL/min (ref >60.00)
Glucose, Bld: 134 mg/dL — ABNORMAL HIGH (ref 70–99)
Potassium: 4.5 mEq/L (ref 3.5–5.1)
Sodium: 137 mEq/L (ref 135–145)
Total Bilirubin: 0.4 mg/dL (ref 0.2–1.2)
Total Protein: 7.4 g/dL (ref 6.0–8.3)

## 2020-11-18 LAB — HEMOGLOBIN A1C: Hgb A1c MFr Bld: 7.2 % — ABNORMAL HIGH (ref 4.6–6.5)

## 2020-11-18 LAB — LIPID PANEL
Cholesterol: 105 mg/dL (ref 0–200)
HDL: 27.1 mg/dL — ABNORMAL LOW (ref >39.00)
LDL Cholesterol: 58 mg/dL (ref 0–99)
NonHDL: 77.89
Total CHOL/HDL Ratio: 4
Triglycerides: 97 mg/dL (ref 0.0–149.0)
VLDL: 19.4 mg/dL (ref 0.0–40.0)

## 2020-11-25 ENCOUNTER — Other Ambulatory Visit: Payer: Self-pay

## 2020-11-25 ENCOUNTER — Ambulatory Visit (INDEPENDENT_AMBULATORY_CARE_PROVIDER_SITE_OTHER): Payer: PPO | Admitting: Endocrinology

## 2020-11-25 ENCOUNTER — Encounter: Payer: Self-pay | Admitting: Endocrinology

## 2020-11-25 VITALS — BP 138/70 | HR 78 | Ht 70.0 in | Wt 180.4 lb

## 2020-11-25 DIAGNOSIS — E1165 Type 2 diabetes mellitus with hyperglycemia: Secondary | ICD-10-CM | POA: Diagnosis not present

## 2020-11-25 DIAGNOSIS — Z794 Long term (current) use of insulin: Secondary | ICD-10-CM

## 2020-11-25 DIAGNOSIS — I10 Essential (primary) hypertension: Secondary | ICD-10-CM | POA: Diagnosis not present

## 2020-11-25 DIAGNOSIS — E782 Mixed hyperlipidemia: Secondary | ICD-10-CM | POA: Diagnosis not present

## 2020-11-25 NOTE — Progress Notes (Signed)
Patient ID: Manuel Petty, male   DOB: 02-18-1946, 75 y.o.   MRN: [id number]   Reason for Appointment: follow-up diabetes  History of Present Illness   Diagnosis: Type 2 DIABETES MELITUS, date of diagnosis: 2007   Previous history: He had been on large doses of Lantus before he was given Victoza in addition and this improved his blood sugar control and reduced his insulin requirement However he subsequently started requiring mealtime coverage with large meals also  RECENT history:  Insulin regimen: [employer] brand Regular Insulin  Using OMNIPOD insulin pump since 3/20  Basal settings: 0.8 midnight--7 AM, 7 AM = 0.95, 12 noon-3 PM = 0.9.  3 PM = 0.75, 7p.m. = 1.4  Recommended boluses breakfast 4-6  units, lunch and supper 6-8 units  Recently total daily insulin use about 53 units  Non-insulin hypoglycemic drugs: Metformin 1 g before breakfast  A1c was fairly consistent at 6.8-6.9 and now 7.2   Current blood sugar patterns and problems identified: His freestyle libre version 2 is still  variably accurate compared to fingersticks as before, sometimes his readings are higher than the actual blood sugar but he is mostly entering libre readings on his pump Previously is not able to get adequate coverage for Dexcom through his insurance Is having better blood sugars in the last week or so since he saw his A1c going up He thinks that with meals and snacks he was likely getting more carbohydrates causing high blood sugars at all times Also he will have a snack like a banana in the middle of the night if his blood sugars are in the normal range causing sugars to go up in the mornings Usually with being on generic regular insulin he is trying to bolus about 30 minutes before eating He is trying to do some walking, up to 3 miles at the gym but only about twice a week along with some yard work He has only a 2 pound weight gain He has about the same basal rates as before  although higher in the evening after 7 PM  Blood sugar pattern interpretation from his CGM download is detailed in the following   Glycemic patterns show variability with better readings in the last 7 days compared to before Sugars are generally well controlled but periodically higher overnight, after breakfast and continuing the rest of the day However on an average his blood sugars are within the target range Highest readings are about 2 PM  LOWEST blood sugars are around 3 AM Overnight blood sugars are mildly increased at midnight but subsequently variable including low normal readings and fairly level in the mornings.  On an average blood sugars are about 145 through the night Frequency of monitoring is excellent HYPERGLYCEMIC episodes occur at times after all the meals and also sometimes at different times overnight Hypoglycemia is only transiently present in the late afternoon or early evening       Side effects from medications: None  Proper timing of medications in relation to meals: Yes.          Monitors blood glucose:  Several times a day.    Glucometer:  FreeStyle Libre      CGM use % of time 97  2-week average/GV 152  Time in range      76%  % Time Above 180 23  % Time above 250   % Time Below 70 1     PRE-MEAL Fasting Lunch Dinner Bedtime Overall  Glucose range:  Averages: 134  150 172 152   POST-MEAL PC Breakfast PC Lunch PC Dinner  Glucose range:     Averages: 171  167      Prior  CGM use % of time  96  2-week average/GV  148  Time in range     80   %  % Time Above 180  17+1  % Time above 250   % Time Below 70  2     PRE-MEAL Fasting Lunch Dinner Bedtime Overall  Glucose range:       Averages:  134   143  175  148   POST-MEAL PC Breakfast PC Lunch PC Dinner  Glucose range:     Averages:  163   148       Meals:2- 3 meals per day. Lunch 2-3 pm; dinner 6 pm.  For breakfast he will sometimes have English muffin and eggs otherwise only [place]  bacon and egg beaters      Dietician visit: Most recent: 08/8183           Complications: are: None     Wt Readings from Last 3 Encounters:  11/25/20 180 lb 6.4 oz (81.8 kg)  08/15/20 178 lb (80.7 kg)  07/13/20 178 lb (80.7 kg)      Lab Results  Component Value Date   HGBA1C 7.2 (H) 11/18/2020   HGBA1C 6.8 (H) 07/06/2020   HGBA1C 6.9 (H) 03/22/2020   Lab Results  Component Value Date   MICROALBUR 14.7 (H) 03/22/2020   LDLCALC 58 11/18/2020   CREATININE 0.90 11/18/2020    Other active problems discussed in review of systems   Allergies as of 11/25/2020   No Known Allergies      Medication List        Accurate as of November 25, 2020  4:18 PM. If you have any questions, ask your nurse or doctor.          FreeStyle [name] Reader Systm Devi 1 Device by Does not apply route daily.   FreeStyle Libre 2 Sensor Misc USE 1 SENSOR ONCE EVERY 14 DAYS TO MONITOR BLOOD SUGAR   FreeStyle Precision Neo Test test strip Generic drug: glucose blood USE TO CHECK BLOOD SUGAR FOR UP TO 3 TIMES DAILY   insulin regular 100 units/mL injection Commonly known as: NovoLIN R Inject 0.68 mLs (68 Units total) into the skin daily.   losartan 50 MG tablet Commonly known as: COZAAR TAKE 1 TABLET BY MOUTH EVERY DAY   metFORMIN 1000 MG tablet Commonly known as: GLUCOPHAGE Take 1 tablet by mouth once daily   Omnipod DASH PDM (Gen 4) Kit 1 each by Other route See admin instructions. Use continuously to administer insulin.   Omnipod DASH Pods (Gen 4) Misc CHANGE DASH POD EVERY 72 HOURS AS DIRECTED   Pfizer-BioNT COVID-19 Vac-TriS Susp injection Generic drug: COVID-19 mRNA Vac-TriS (Pfizer) Inject into the muscle.   pravastatin 20 MG tablet Commonly known as: PRAVACHOL TAKE 1 TABLET(20 MG) BY MOUTH DAILY        Allergies: No Known Allergies  Past Medical History:  Diagnosis Date   Diabetes mellitus ([place]) 2,000   Erectile dysfunction    HTN (hypertension)    Hyperlipidemia     Personal history of colonic adenomas 07/18/2009   Renal cancer ([place])    1992, has 1 kidney ; does not see urology any more    Past Surgical History:  Procedure Laterality Date   COLONOSCOPY     NEPHRECTOMY  1992    Family History  Problem Relation Age of Onset   Diabetes Father    Hypertension Father    CAD Father        F (MI age 72), Brother in [place] 83   Dementia Brother        parkinsons? dementia?   Prostate cancer Brother        dx age 36   Cancer Brother        blood cancer    CAD Brother    Colon cancer Neg Hx    Stroke Neg Hx     Social History:  reports that he has quit smoking. He has never used smokeless tobacco. He reports current alcohol use of about 2.0 standard drinks of alcohol per week. He reports that he does not use drugs.  Review of Systems -     HYPERLIPIDEMIA:   Treated with pravastatin, had difficulty tolerating niacin  He previously had been on Slo-Niacin OTC 500 mg twice a day for low HDL   HDL has been in the 20s along with small LDL particles on previous determinations  Particle number was below 1000 in 06/2014 with increased small particles    Lab Results  Component Value Date   CHOL 105 11/18/2020   HDL 27.10 (L) 11/18/2020   LDLCALC 58 11/18/2020   LDLDIRECT 45.7 05/31/2009   TRIG 97.0 11/18/2020   CHOLHDL 4 11/18/2020    He has mild hypertension, taking LOSARTAN 50 mg Previously potassium was high on lisinopril  He monitors BP at home and recently reading 127 /68 Usually his first blood pressure reading in the office is high  BP Readings from Last 3 Encounters:  11/25/20 138/70  08/15/20 140/80  07/13/20 140/68   Has stable potassium  Lab Results  Component Value Date   K 4.5 11/18/2020   Eye exams: Annual, last 03/2019, report not available   Diabetic foot exam was done in 3/22   Examination:   BP 138/70   Pulse 78   Ht 5' 10"  (1.778 m)   Wt 180 lb 6.4 oz (81.8 kg)   SpO2 97%   BMI 25.88 kg/m   Body  mass index is 25.88 kg/m.     Assesment/Plan:   Diabetes type 2, nonobese: See history of present illness for detailed discussion of his current blood sugar patterns, problems identified   A1c is 7.2, previously 6.8  Currently using the OmniPod insulin with Novolin regular insulin and Metformin in the morning  His blood sugars are very variable at times but generally better in the last week He thinks with moderating his carbohydrates and snacks his blood sugars are improved Only has some high readings after breakfast such as when eating cereal Also may have variably high readings after dinner or overnight based on his intake He does not always add extra boluses for high fat meals  Now trying to exercise also As before not taking metformin in evening because of tendency to low sugars overnight  Recommendations: He will not need to make the any changes with his basal rates He will need to make sure he boluses 30 minutes before eating Add extra 2 to 4 units for higher fat meals Regular exercise Avoid skipping boluses when blood sugars are normal Need to make sure he is adding some protein to breakfast and not just eat cornflakes To watch blood sugars after he exercises for any tendency to low sugars Stop having snacks in the middle of the night  even if blood sugars are 100 since his basal rate is not excessive  LIPIDS: Well controlled except for low HDL, likely to be from metabolic syndrome.  This is despite more exercise recently  HYPERTENSION: Well-controlled on losartan without any change in renal function or potassium, to continue same dose Continue monitoring regularly at home   Total visit time including counseling = 30 minutes  There are no Patient Instructions on file for this visit.   Follow-up in 4 months   Elayne Snare 11/25/2020, 4:18 PM

## 2020-12-13 ENCOUNTER — Other Ambulatory Visit: Payer: Self-pay | Admitting: Endocrinology

## 2020-12-20 ENCOUNTER — Ambulatory Visit: Payer: Self-pay

## 2020-12-20 ENCOUNTER — Other Ambulatory Visit: Payer: Self-pay

## 2020-12-20 ENCOUNTER — Ambulatory Visit: Payer: PPO | Admitting: Family Medicine

## 2020-12-20 ENCOUNTER — Encounter: Payer: Self-pay | Admitting: Family Medicine

## 2020-12-20 VITALS — BP 110/78 | Ht 70.0 in | Wt 180.0 lb

## 2020-12-20 DIAGNOSIS — G5603 Carpal tunnel syndrome, bilateral upper limbs: Secondary | ICD-10-CM

## 2020-12-20 MED ORDER — METHYLPREDNISOLONE ACETATE 40 MG/ML IJ SUSP
40.0000 mg | Freq: Once | INTRAMUSCULAR | Status: AC
  Administered 2020-12-20: 40 mg via INTRA_ARTICULAR

## 2020-12-20 NOTE — Assessment & Plan Note (Signed)
Acute worsening of his left-sided carpal tunnel. -Counseled on home exercise therapy and supportive care. -Injection today. -Could consider sending for surgery.

## 2020-12-20 NOTE — Patient Instructions (Signed)
Good to see you Please try the exercises   Please send me a message in MyChart with any questions or updates.  Please see me back in 4 weeks or as needed .   --Dr. Raeford Razor

## 2020-12-20 NOTE — Progress Notes (Signed)
Manuel Petty - 75 y.o. male MRN [id number]  Date of birth: 1946/02/09  SUBJECTIVE:  Including CC & ROS.  No chief complaint on file.   Manuel Petty is a 75 y.o. male that is presenting with acute worsening of his left carpal tunnel.  He has done well since his previous injection.  Denies any new or different activities..   Review of Systems See HPI   HISTORY: Past Medical, Surgical, Social, and Family History Reviewed & Updated per EMR.   Pertinent Historical Findings include:  Past Medical History:  Diagnosis Date   Diabetes mellitus ([place]) 2,000   Erectile dysfunction    HTN (hypertension)    Hyperlipidemia    Personal history of colonic adenomas 07/18/2009   Renal cancer ([place])    1992, has 1 kidney ; does not see urology any more    Past Surgical History:  Procedure Laterality Date   COLONOSCOPY     NEPHRECTOMY  1992    Family History  Problem Relation Age of Onset   Diabetes Father    Hypertension Father    CAD Father        F (MI age 50), Brother in hois 70   Dementia Brother        parkinsons? dementia?   Prostate cancer Brother        dx age 38   Cancer Brother        blood cancer    CAD Brother    Colon cancer Neg Hx    Stroke Neg Hx     Social History   Socioeconomic History   Marital status: Married    Spouse name: Not on file   Number of children: 1   Years of education: Not on file   Highest education level: Not on file  Occupational History   Occupation: retired   Tobacco Use   Smoking status: Former   Smokeless tobacco: Never   Tobacco comments:    quit in 1992, 2 ppd  Substance and Sexual Activity   Alcohol use: Yes    Alcohol/week: 2.0 standard drinks    Types: 2 Cans of beer per week   Drug use: No    Comment: used to use marihuana     Sexual activity: Not on file  Other Topics Concern   Not on file  Social History Narrative   Lives w/ wife   Social Determinants of Health   Financial Resource Strain: Not on  file  Food Insecurity: Not on file  Transportation Needs: Not on file  Physical Activity: Not on file  Stress: Not on file  Social Connections: Not on file  Intimate Partner Violence: Not on file     PHYSICAL EXAM:  VS: BP 110/78 (BP Location: Right Arm, Patient Position: Sitting, Cuff Size: Normal)   Ht '5\' 10"'$  (1.778 m)   Wt 180 lb (81.6 kg)   BMI 25.83 kg/m  Physical Exam Gen: NAD, alert, cooperative with exam, well-appearing MSK:  Left wrist: No signs of atrophy. Positive Tinel's at the wrist. Neurovascular intact  Limited ultrasound: Left wrist:  Flattening of the median nerve within the carpal tunnel.  Summary: Carpal tunnel disease  Ultrasound and interpretation by Clearance Coots, MD   Aspiration/Injection Procedure Note Manuel Petty 12/05/1945  Procedure: Injection Indications: Left carpal tunnel  Procedure Details Consent: Risks of procedure as well as the alternatives and risks of each were explained to the (patient/caregiver).  Consent for procedure obtained. Time Out: Verified patient identification,  verified procedure, site/side was marked, verified correct patient position, special equipment/implants available, medications/allergies/relevent history reviewed, required imaging and test results available.  Performed.  The area was cleaned with iodine and alcohol swabs.    The left carpal tunnel was injected using 1 cc's of 40 mg Depo-Medrol and 1 cc's of 0.25% bupivacaine with a 25 1 1/2" needle.  Ultrasound was used. Images were obtained in long views showing the injection.     A sterile dressing was applied.  Patient did tolerate procedure well.    ASSESSMENT & PLAN:   CARPAL TUNNEL SYNDROME, BILATERAL Acute worsening of his left-sided carpal tunnel. -Counseled on home exercise therapy and supportive care. -Injection today. -Could consider sending for surgery.

## 2021-01-16 ENCOUNTER — Other Ambulatory Visit: Payer: Self-pay | Admitting: Endocrinology

## 2021-01-17 DIAGNOSIS — Z794 Long term (current) use of insulin: Secondary | ICD-10-CM

## 2021-01-17 DIAGNOSIS — E1165 Type 2 diabetes mellitus with hyperglycemia: Secondary | ICD-10-CM

## 2021-02-07 ENCOUNTER — Ambulatory Visit: Payer: PPO | Attending: Internal Medicine

## 2021-02-07 ENCOUNTER — Other Ambulatory Visit (HOSPITAL_BASED_OUTPATIENT_CLINIC_OR_DEPARTMENT_OTHER): Payer: Self-pay

## 2021-02-07 DIAGNOSIS — Z23 Encounter for immunization: Secondary | ICD-10-CM

## 2021-02-07 MED ORDER — INFLUENZA VAC A&B SA ADJ QUAD 0.5 ML IM PRSY
PREFILLED_SYRINGE | INTRAMUSCULAR | 0 refills | Status: DC
  Filled 2021-02-07: qty 0.5, 1d supply, fill #0

## 2021-02-07 NOTE — Progress Notes (Signed)
   Covid-19 Vaccination Clinic  Name:  NORMA IGNASIAK    MRN: [id number] DOB: 03-10-1946  02/07/2021  Mr. Martelli was observed post Covid-19 immunization for 15 minutes without incident. He was provided with Vaccine Information Sheet and instruction to access the V-Safe system.   Mr. Paulding was instructed to call 911 with any severe reactions post vaccine: Difficulty breathing  Swelling of face and throat  A fast heartbeat  A bad rash all over body  Dizziness and weakness

## 2021-02-17 ENCOUNTER — Other Ambulatory Visit (HOSPITAL_BASED_OUTPATIENT_CLINIC_OR_DEPARTMENT_OTHER): Payer: Self-pay

## 2021-02-17 MED ORDER — COVID-19MRNA BIVAL VACC PFIZER 30 MCG/0.3ML IM SUSP
INTRAMUSCULAR | 0 refills | Status: DC
  Filled 2021-02-17: qty 0.3, 1d supply, fill #0

## 2021-03-15 ENCOUNTER — Other Ambulatory Visit (INDEPENDENT_AMBULATORY_CARE_PROVIDER_SITE_OTHER): Payer: PPO

## 2021-03-15 ENCOUNTER — Other Ambulatory Visit: Payer: Self-pay

## 2021-03-15 ENCOUNTER — Other Ambulatory Visit: Payer: Self-pay | Admitting: Endocrinology

## 2021-03-15 DIAGNOSIS — Z5181 Encounter for therapeutic drug level monitoring: Secondary | ICD-10-CM | POA: Diagnosis not present

## 2021-03-15 DIAGNOSIS — E1165 Type 2 diabetes mellitus with hyperglycemia: Secondary | ICD-10-CM

## 2021-03-15 DIAGNOSIS — E119 Type 2 diabetes mellitus without complications: Secondary | ICD-10-CM

## 2021-03-15 DIAGNOSIS — Z794 Long term (current) use of insulin: Secondary | ICD-10-CM

## 2021-03-15 LAB — MICROALBUMIN / CREATININE URINE RATIO
Creatinine,U: 92.2 mg/dL
Microalb Creat Ratio: 13.8 mg/g (ref 0.0–30.0)
Microalb, Ur: 12.8 mg/dL — ABNORMAL HIGH (ref 0.0–1.9)

## 2021-03-15 LAB — VITAMIN B12: Vitamin B-12: 100 pg/mL — ABNORMAL LOW (ref 211–911)

## 2021-03-15 LAB — BASIC METABOLIC PANEL
BUN: 12 mg/dL (ref 6–23)
CO2: 25 mEq/L (ref 19–32)
Calcium: 8.5 mg/dL (ref 8.4–10.5)
Chloride: 107 mEq/L (ref 96–112)
Creatinine, Ser: 0.78 mg/dL (ref 0.40–1.50)
GFR: 88.76 mL/min (ref >60.00)
Glucose, Bld: 169 mg/dL — ABNORMAL HIGH (ref 70–99)
Potassium: 4.4 mEq/L (ref 3.5–5.1)
Sodium: 138 mEq/L (ref 135–145)

## 2021-03-15 LAB — HEMOGLOBIN A1C: Hgb A1c MFr Bld: 6.9 % — ABNORMAL HIGH (ref 4.6–6.5)

## 2021-03-16 ENCOUNTER — Encounter: Payer: Self-pay | Admitting: Internal Medicine

## 2021-03-22 ENCOUNTER — Ambulatory Visit: Payer: PPO | Admitting: Endocrinology

## 2021-03-22 ENCOUNTER — Other Ambulatory Visit: Payer: Self-pay

## 2021-03-22 ENCOUNTER — Encounter: Payer: Self-pay | Admitting: Endocrinology

## 2021-03-22 VITALS — BP 144/70 | HR 76 | Ht 70.0 in | Wt 176.4 lb

## 2021-03-22 DIAGNOSIS — E782 Mixed hyperlipidemia: Secondary | ICD-10-CM | POA: Diagnosis not present

## 2021-03-22 DIAGNOSIS — E538 Deficiency of other specified B group vitamins: Secondary | ICD-10-CM | POA: Diagnosis not present

## 2021-03-22 DIAGNOSIS — E1165 Type 2 diabetes mellitus with hyperglycemia: Secondary | ICD-10-CM | POA: Diagnosis not present

## 2021-03-22 DIAGNOSIS — Z794 Long term (current) use of insulin: Secondary | ICD-10-CM

## 2021-03-22 NOTE — Patient Instructions (Addendum)
Basal at 6 pm is 1.55  Basal at 12 am 0.55  B 12 daily 1/2 cc daily  Bolus 30 min before meals

## 2021-03-22 NOTE — Progress Notes (Signed)
Patient ID: Manuel Petty, male   DOB: 02/07/1946, 75 y.o.   MRN: [id number]   Reason for Appointment: follow-up diabetes  History of Present Illness   Diagnosis: Type 2 DIABETES MELITUS, date of diagnosis: 2007   Previous history: He had been on large doses of Lantus before he was given Victoza in addition and this improved his blood sugar control and reduced his insulin requirement However he subsequently started requiring mealtime coverage with large meals also  RECENT history:  Insulin regimen: [employer] brand Regular Insulin  Using OMNIPOD insulin pump since 3/20  Basal settings: 0.65 midnight--7 AM, 7 AM = 0.95, 12 noon-3 PM = 0.9.  3 PM = 0.75, 7p.m. = 1.4  Recommended boluses breakfast 4-6  units, lunch and supper 6-8 units  Recently total daily insulin use about 53 units  Non-insulin hypoglycemic drugs: Metformin 1 g before breakfast  A1c was fairly consistent at 6.8-6.9 and now 6.9   Current blood sugar patterns and problems identified: His freestyle libre version 2 readings do not always correlate with his fingersticks although appears that with stable readings it is fairly close  He says he was having some tendency to low sugars overnight a few weeks ago and he lowered his midnight basal rate on his own However he seems to be having still occasionally low normal or low readings that around 2-3 AM  Occasionally may still have high readings after breakfast when eating cereal but is trying to avoid cornflakes His blood sugar may be relatively higher before dinnertime as seen on his blood sugars entered on his pump  However because of time change difficult to know his postprandial patterns consistently in the evening  Occasionally his sugars may be higher after meals if he is bolusing right after eating rather than 30 minutes before  May be getting low normal readings late afternoon from being more active at times  His weight is down slightly Has  continued to go for exercise 3 days a week at the gym  Blood sugar pattern interpretation from his CGM download for the last 2 weeks:  His blood sugars on an average are within the target range throughout the day and night  HIGHEST readings are usually after about 8 PM through midnight Lowest readings are around 2-4 AM  Overnight blood sugars show decline significantly between midnight-3 AM but only occasionally below normal  HYPERGLYCEMIA is occurring mostly after his meals to a variable extent and inconsistently  Periodically blood sugars appear to be higher even before dinnertime  Has significant variability in his blood sugars around mealtimes especially evenings  Blood sugar may be periodically slightly low late afternoon also       Side effects from medications: None             FreeStyle Libre last 2 weeks data:  CGM use % of time 98  2-week average/GV 136/27  Time in range     87   %  % Time Above 180 12  % Time above 250   % Time Below 70 1     PRE-MEAL Fasting Lunch Dinner Bedtime Overall  Glucose range:       Averages: 121  149 166    POST-MEAL PC Breakfast PC Lunch PC Dinner  Glucose range:     Averages:  166 172   PREVIOUSLY   CGM use % of time 97  2-week average/GV 152  Time in range      76%  %  Time Above 180 23  % Time above 250   % Time Below 70 1     PRE-MEAL Fasting Lunch Dinner Bedtime Overall  Glucose range:       Averages: 134  150 172 152   POST-MEAL PC Breakfast PC Lunch PC Dinner  Glucose range:     Averages: 171  167         Meals:2- 3 meals per day. Lunch 2-3 pm; dinner 6 pm.  For breakfast he will sometimes have English muffin and eggs otherwise only [place] bacon and egg beaters      Dietician visit: Most recent: 12/1273           Complications: are: None     Wt Readings from Last 3 Encounters:  03/22/21 176 lb 6.4 oz (80 kg)  12/20/20 180 lb (81.6 kg)  11/25/20 180 lb 6.4 oz (81.8 kg)      Lab Results  Component Value  Date   HGBA1C 6.9 (H) 03/15/2021   HGBA1C 7.2 (H) 11/18/2020   HGBA1C 6.8 (H) 07/06/2020   Lab Results  Component Value Date   MICROALBUR 12.8 (H) 03/15/2021   LDLCALC 58 11/18/2020   CREATININE 0.78 03/15/2021    Other active problems discussed in review of systems   Allergies as of 03/22/2021   No Known Allergies      Medication List        Accurate as of March 22, 2021  3:45 PM. If you have any questions, ask your nurse or doctor.          Fluad Quadrivalent 0.5 ML injection Generic drug: influenza vaccine adjuvanted Inject into the muscle.   FreeStyle [place] 2 Reader Systm Devi 1 Device by Does not apply route daily.   FreeStyle Libre 2 Sensor Misc USE 1 SENSOR ONCE EVERY 14 DAYS TO MONITOR BLOOD SUGAR   FreeStyle Precision Neo Test test strip Generic drug: glucose blood USE TO CHECK BLOOD SUGAR FOR UP TO THREE TIMES DAILY   insulin regular 100 units/mL injection Commonly known as: NovoLIN R Inject 0.68 mLs (68 Units total) into the skin daily.   losartan 50 MG tablet Commonly known as: COZAAR TAKE 1 TABLET BY MOUTH EVERY DAY   metFORMIN 1000 MG tablet Commonly known as: GLUCOPHAGE Take 1 tablet by mouth once daily   Omnipod DASH PDM (Gen 4) Kit 1 each by Other route See admin instructions. Use continuously to administer insulin.   Omnipod DASH Pods (Gen 4) Misc CHANGE DASH POD EVERY 72 HOURS AS DIRECTED   Pfizer COVID-19 Vac Bivalent injection Generic drug: COVID-19 mRNA bivalent vaccine (Pfizer) Inject into the muscle.   Pfizer-BioNT COVID-19 Vac-TriS Susp injection Generic drug: COVID-19 mRNA Vac-TriS (Pfizer) Inject into the muscle.   pravastatin 20 MG tablet Commonly known as: PRAVACHOL TAKE 1 TABLET(20 MG) BY MOUTH DAILY        Allergies: No Known Allergies  Past Medical History:  Diagnosis Date   Diabetes mellitus ([place]) 2,000   Erectile dysfunction    HTN (hypertension)    Hyperlipidemia    Personal history of colonic  adenomas 07/18/2009   Renal cancer ([place])    1992, has 1 kidney ; does not see urology any more    Past Surgical History:  Procedure Laterality Date   COLONOSCOPY     NEPHRECTOMY  1992    Family History  Problem Relation Age of Onset   Diabetes Father    Hypertension Father    CAD Father  F (MI age 4), Brother in hois 63   Dementia Brother        parkinsons? dementia?   Prostate cancer Brother        dx age 39   Cancer Brother        blood cancer    CAD Brother    Colon cancer Neg Hx    Stroke Neg Hx     Social History:  reports that he has quit smoking. He has never used smokeless tobacco. He reports current alcohol use of about 2.0 standard drinks per week. He reports that he does not use drugs.  Review of Systems -     HYPERLIPIDEMIA:   Treated with pravastatin, has had significant low HDL levels in the past  He previously had been on Slo-Niacin OTC 500 mg twice a day for low HDL but had difficulty tolerating this   HDL has been in the 20s along with small LDL particles on previous determinations  Particle number was below 1000 in 06/2014 with increased small particles    Lab Results  Component Value Date   CHOL 105 11/18/2020   HDL 27.10 (L) 11/18/2020   LDLCALC 58 11/18/2020   LDLDIRECT 45.7 05/31/2009   TRIG 97.0 11/18/2020   CHOLHDL 4 11/18/2020    He has mild hypertension, taking LOSARTAN 50 mg Previously potassium was high on lisinopril  He monitors BP at home and recently reading 116/70 Usually his first blood pressure reading in the office is high  BP Readings from Last 3 Encounters:  03/22/21 (!) 144/70  12/20/20 110/78  11/25/20 138/70   Has normal potassium  Lab Results  Component Value Date   K 4.4 03/15/2021   Eye exams: Annual, last 03/2020, report not available   Diabetic foot exam was done in 3/22  B12 deficiency: Screening was done as patient requested testing and his level is only 100 He has a little tingling in his  fingertips but this may be from carpal tunnel syndrome, no other symptoms currently   Examination:   BP (!) 144/70   Pulse 76   Ht 5' 10"  (1.778 m)   Wt 176 lb 6.4 oz (80 kg)   SpO2 97%   BMI 25.31 kg/m   Body mass index is 25.31 kg/m.     Assesment/Plan:   Diabetes type 2, nonobese: See history of present illness for detailed discussion of his current blood sugar patterns, problems identified   A1c is  6.9 and improved  Currently using the OmniPod insulin with Novolin regular insulin and Metformin in the morning  His blood sugars are better controlled with 87% within the target range  Blood sugar patterns were discussed above Although most of his readings are within target range he still has some inconsistent postprandial control based on timing of insulin, diet and sometimes higher.  Meal readings at dinnertime  Has mild tendency to hypoglycemia overnight or late afternoon with exercise Although his freestyle Elenor Legato is not always correlating with his fingersticks this may be mostly when his blood sugars are rising or falling He prefers generic medications and insulin  Recommendations: He will increase basal at 6 PM up to 1.55 but decrease basal at midnight down to 0.55 He will look into the coverage for OmniPod 5 and freestyle libre 3 devices  He will need to make sure he boluses 30 minutes before eating Add extra 2 to 4 units for higher fat meals Continue same carbohydrate coverage May need to use temporary basal  when blood sugars are low normal before or after exercise He will avoid overtreating low normal or low sugars especially overnight  B12 deficiency: Not clear if he is symptomatic Likely this is from metformin use although may be multifactorial, he will start 2500 mcg of sublingual B12 that he has already purchased  HYPERTENSION: Well-controlled on losartan, as before blood pressure is higher in the office    Total visit time including counseling = 30  minutes  Patient Instructions  Basal at 6 pm is 1.55  Basal at 12 am 0.55  B 12 daily 1/2 cc daily  Bolus 30 min before meals    Follow-up in 3 months   Jaanvi Fizer 03/22/2021, 3:45 PM

## 2021-05-13 ENCOUNTER — Other Ambulatory Visit: Payer: Self-pay | Admitting: Endocrinology

## 2021-06-07 ENCOUNTER — Other Ambulatory Visit (HOSPITAL_COMMUNITY): Payer: Self-pay

## 2021-06-07 ENCOUNTER — Telehealth: Payer: Self-pay

## 2021-06-07 NOTE — Telephone Encounter (Signed)
Test Strips are not covered under Part D.  They are covered under Medical/Part B.

## 2021-06-09 ENCOUNTER — Other Ambulatory Visit: Payer: Self-pay | Admitting: Endocrinology

## 2021-06-13 ENCOUNTER — Encounter: Payer: Self-pay | Admitting: Endocrinology

## 2021-06-14 ENCOUNTER — Other Ambulatory Visit: Payer: Self-pay | Admitting: Endocrinology

## 2021-06-14 DIAGNOSIS — Z125 Encounter for screening for malignant neoplasm of prostate: Secondary | ICD-10-CM

## 2021-06-21 ENCOUNTER — Other Ambulatory Visit (INDEPENDENT_AMBULATORY_CARE_PROVIDER_SITE_OTHER): Payer: PPO

## 2021-06-21 ENCOUNTER — Other Ambulatory Visit: Payer: Self-pay

## 2021-06-21 DIAGNOSIS — E1165 Type 2 diabetes mellitus with hyperglycemia: Secondary | ICD-10-CM | POA: Diagnosis not present

## 2021-06-21 DIAGNOSIS — E538 Deficiency of other specified B group vitamins: Secondary | ICD-10-CM | POA: Diagnosis not present

## 2021-06-21 DIAGNOSIS — E782 Mixed hyperlipidemia: Secondary | ICD-10-CM

## 2021-06-21 DIAGNOSIS — Z125 Encounter for screening for malignant neoplasm of prostate: Secondary | ICD-10-CM

## 2021-06-21 DIAGNOSIS — Z794 Long term (current) use of insulin: Secondary | ICD-10-CM

## 2021-06-21 LAB — LIPID PANEL
Cholesterol: 103 mg/dL (ref 0–200)
HDL: 26.6 mg/dL — ABNORMAL LOW (ref >39.00)
LDL Cholesterol: 43 mg/dL (ref 0–99)
NonHDL: 76.09
Total CHOL/HDL Ratio: 4
Triglycerides: 165 mg/dL — ABNORMAL HIGH (ref 0.0–149.0)
VLDL: 33 mg/dL (ref 0.0–40.0)

## 2021-06-21 LAB — COMPREHENSIVE METABOLIC PANEL
ALT: 38 U/L (ref 0–53)
AST: 22 U/L (ref 0–37)
Albumin: 4.2 g/dL (ref 3.5–5.2)
Alkaline Phosphatase: 80 U/L (ref 39–117)
BUN: 16 mg/dL (ref 6–23)
CO2: 22 mEq/L (ref 19–32)
Calcium: 8.8 mg/dL (ref 8.4–10.5)
Chloride: 107 mEq/L (ref 96–112)
Creatinine, Ser: 0.89 mg/dL (ref 0.40–1.50)
GFR: 85.13 mL/min (ref >60.00)
Glucose, Bld: 202 mg/dL — ABNORMAL HIGH (ref 70–99)
Potassium: 4.5 mEq/L (ref 3.5–5.1)
Sodium: 139 mEq/L (ref 135–145)
Total Bilirubin: 0.3 mg/dL (ref 0.2–1.2)
Total Protein: 6.9 g/dL (ref 6.0–8.3)

## 2021-06-21 LAB — HEMOGLOBIN A1C: Hgb A1c MFr Bld: 7.2 % — ABNORMAL HIGH (ref 4.6–6.5)

## 2021-06-21 LAB — VITAMIN B12: Vitamin B-12: 209 pg/mL — ABNORMAL LOW (ref 211–911)

## 2021-06-21 LAB — PSA, MEDICARE: PSA: 0.47 ng/ml (ref 0.10–4.00)

## 2021-06-30 ENCOUNTER — Encounter: Payer: Self-pay | Admitting: Endocrinology

## 2021-06-30 ENCOUNTER — Ambulatory Visit: Payer: PPO | Admitting: Endocrinology

## 2021-06-30 ENCOUNTER — Other Ambulatory Visit: Payer: Self-pay

## 2021-06-30 VITALS — BP 136/74 | HR 74 | Ht 70.0 in | Wt 178.2 lb

## 2021-06-30 DIAGNOSIS — E782 Mixed hyperlipidemia: Secondary | ICD-10-CM | POA: Diagnosis not present

## 2021-06-30 DIAGNOSIS — I10 Essential (primary) hypertension: Secondary | ICD-10-CM | POA: Diagnosis not present

## 2021-06-30 DIAGNOSIS — Z794 Long term (current) use of insulin: Secondary | ICD-10-CM | POA: Diagnosis not present

## 2021-06-30 DIAGNOSIS — E538 Deficiency of other specified B group vitamins: Secondary | ICD-10-CM

## 2021-06-30 DIAGNOSIS — E1165 Type 2 diabetes mellitus with hyperglycemia: Secondary | ICD-10-CM | POA: Diagnosis not present

## 2021-06-30 MED ORDER — INSULIN LISPRO 100 UNIT/ML IJ SOLN: INTRAMUSCULAR | 2 refills | Status: DC

## 2021-06-30 MED ORDER — ONETOUCH VERIO W/DEVICE KIT: PACK | 0 refills | Status: DC

## 2021-06-30 MED ORDER — ONETOUCH VERIO VI STRP: ORAL_STRIP | 12 refills | Status: DC

## 2021-06-30 NOTE — Progress Notes (Signed)
Patient ID: Manuel Petty, male   DOB: 10-23-1945, 76 y.o.   MRN: [id number]   Reason for Appointment: follow-up diabetes  History of Present Illness   Diagnosis: Type 2 DIABETES MELITUS, date of diagnosis: 2007   Previous history: He had been on large doses of Lantus before he was given Victoza in addition and this improved his blood sugar control and reduced his insulin requirement However he subsequently started requiring mealtime coverage with large meals also  RECENT history:  Insulin regimen: [employer] brand Regular Insulin  Using OMNIPOD insulin pump since 3/20  Basal settings: 0.65 midnight-8: 30 AM, 8:30 AM = 0.9, 10 AM = 1.1,2 PM = 0.95.  5 PM = 1.3, 6 PM = 1.55  Recommended boluses breakfast 4-6  units, lunch and supper 6-8 units  Recently total daily insulin use about 53 units  Non-insulin hypoglycemic drugs: Metformin 1 g before breakfast  A1c was 6.9 and now 7.2   Current blood sugar patterns and problems identified: His freestyle libre version 2 readings have been correlated with his fingersticks with the FreeStyle meter and they are fairly close now Entering these in his CGM record He appears to have higher readings overall compared to his last visit Even with not taking any metformin at night his blood sugars are significantly lower during the night despite having a lower basal rate at midnight However his blood sugars tend to be high the rest of the day with moderate variability However his boluses appear to be adequate which is only mild increase in his blood sugars after meals most of the time Occasionally he will bolus after starting to eat if he forgets His weight is down slightly Has not been going recently for  exercise at the gym  Blood sugar pattern interpretation from his CGM download for the last 2 weeks:  Summary of patterns: Blood sugars are relatively lower overnight especially around 3-4 AM and then generally on an average  close to 180 in the afternoons and evenings and having significant variability between noon and midnight blood sugars on an average are within the target range throughout the day and night  HIGHEST readings are on an average mid afternoon or early evening   Lowest readings are around 2-4 AM, average between 2-4 AM = 150 Overnight blood sugars are about 160 average and then show progressive decline until at least 4-5 AM and then rising; occasional hypoglycemia also  HYPERGLYCEMIA is occurring mostly after his meals which are about noon time and variable times in the evening Premeal readings are generally high before his first meal at noon time and also dinner on an average with some variability Hypoglycemia only overnight as above       Side effects from medications: None             FreeStyle Libre last 2 weeks data:  CGM use % of time 96  2-week average/GV 159/25  Time in range    70    %  % Time Above 180 29  % Time above 250   % Time Below 70 1     PRE-MEAL Fasting Lunch Dinner Bedtime Overall  Glucose range:       Averages: 158  175  159   POST-MEAL PC Breakfast PC Lunch PC Dinner  Glucose range:     Averages:  191 186   Previously:  CGM use % of time 98  2-week average/GV 136/27  Time in range     87   %  % Time Above 180 12  % Time above 250   % Time Below 70 1     PRE-MEAL Fasting Lunch Dinner Bedtime Overall  Glucose range:       Averages: 121  149 166    POST-MEAL PC Breakfast PC Lunch PC Dinner  Glucose range:     Averages:  166 172       Meals:2- 3 meals per day. Lunch 2-3 pm; dinner 6 pm.  For breakfast he will sometimes have English muffin and eggs otherwise only [place] bacon and egg beaters      Dietician visit: Most recent: 09/4560           Complications: are: None     Wt Readings from Last 3 Encounters:  06/30/21 178 lb 3.2 oz (80.8 kg)  03/22/21 176 lb 6.4 oz (80 kg)  12/20/20 180 lb (81.6 kg)      Lab Results  Component Value Date    HGBA1C 7.2 (H) 06/21/2021   HGBA1C 6.9 (H) 03/15/2021   HGBA1C 7.2 (H) 11/18/2020   Lab Results  Component Value Date   MICROALBUR 12.8 (H) 03/15/2021   LDLCALC 43 06/21/2021   CREATININE 0.89 06/21/2021    Other active problems discussed in review of systems   Allergies as of 06/30/2021   No Known Allergies      Medication List        Accurate as of June 30, 2021 11:59 PM. If you have any questions, ask your nurse or doctor.          STOP taking these medications    insulin regular 100 units/mL injection Commonly known as: NovoLIN R Stopped by: Elayne Snare, MD       TAKE these medications    Fluad Quadrivalent 0.5 ML injection Generic drug: influenza vaccine adjuvanted Inject into the muscle.   FreeStyle [place] 2 Reader Systm Devi 1 Device by Does not apply route daily.   FreeStyle Libre 2 Sensor Misc USE 1 SENSOR EVERY 14 DAYS TO MONITOR BLOOD SUGAR   FreeStyle Precision Neo Test test strip Generic drug: glucose blood USE TO CHECK BLOOD SUGAR FOR UP TO THREE TIMES DAILY What changed: Another medication with the same name was added. Make sure you understand how and when to take each. Changed by: Elayne Snare, MD   OneTouch Verio test strip Generic drug: glucose blood Use to check blood blood sugar once a day What changed: You were already taking a medication with the same name, and this prescription was added. Make sure you understand how and when to take each. Changed by: Elayne Snare, MD   insulin lispro 100 UNIT/ML injection Commonly known as: HumaLOG Inject 0.68 mLs (68 Units total) into the skin daily. Started by: Elayne Snare, MD   losartan 50 MG tablet Commonly known as: COZAAR TAKE 1 TABLET BY MOUTH EVERY DAY   metFORMIN 1000 MG tablet Commonly known as: GLUCOPHAGE Take 1 tablet by mouth once daily   niacin 500 MG tablet Take 500 mg by mouth at bedtime.   Omnipod DASH PDM (Gen 4) Kit 1 each by Other route See admin instructions. Use  continuously to administer insulin.   Omnipod DASH Pods (Gen 4) Misc CHANGE DASH POD EVERY 72 HOURS AS DIRECTED   OneTouch Verio w/Device Kit Use to check blood sugar daily Started by: Elayne Snare, MD   Pfizer COVID-19 Vac Bivalent injection Generic drug: COVID-19 mRNA  bivalent vaccine Therapist, music) Inject into the muscle.   Pfizer-BioNT COVID-19 Vac-TriS Susp injection Generic drug: COVID-19 mRNA Vac-TriS (Pfizer) Inject into the muscle.   pravastatin 20 MG tablet Commonly known as: PRAVACHOL TAKE 1 TABLET(20 MG) BY MOUTH DAILY        Allergies: No Known Allergies  Past Medical History:  Diagnosis Date   Diabetes mellitus ([place]) 2,000   Erectile dysfunction    HTN (hypertension)    Hyperlipidemia    Personal history of colonic adenomas 07/18/2009   Renal cancer ([place])    1992, has 1 kidney ; does not see urology any more    Past Surgical History:  Procedure Laterality Date   COLONOSCOPY     NEPHRECTOMY  1992    Family History  Problem Relation Age of Onset   Diabetes Father    Hypertension Father    CAD Father        F (MI age 29), Brother in hois 69   Dementia Brother        parkinsons? dementia?   Prostate cancer Brother        dx age 90   Cancer Brother        blood cancer    CAD Brother    Colon cancer Neg Hx    Stroke Neg Hx     Social History:  reports that he has quit smoking. He has never used smokeless tobacco. He reports current alcohol use of about 2.0 standard drinks per week. He reports that he does not use drugs.  Review of Systems -     HYPERLIPIDEMIA:   Treated with pravastatin, has had significant low HDL levels in the past  He previously had been on Slo-Niacin OTC 500 mg twice a day for low HDL but had difficulty tolerating this   HDL has been in the 20s along with small LDL particles on previous determinations  Particle number was below 1000 in 06/2014 with increased small particles Triglycerides slightly higher now   Lab Results   Component Value Date   CHOL 103 06/21/2021   HDL 26.60 (L) 06/21/2021   LDLCALC 43 06/21/2021   LDLDIRECT 45.7 05/31/2009   TRIG 165.0 (H) 06/21/2021   CHOLHDL 4 06/21/2021    He has mild hypertension, taking LOSARTAN 50 mg Previously potassium was high on lisinopril  He monitors BP at home and most of his readings are averaging around 161 systolic Occasionally has mild whitecoat syndrome  BP Readings from Last 3 Encounters:  06/30/21 136/74  03/22/21 (!) 144/70  12/20/20 110/78   Has normal potassium  Lab Results  Component Value Date   K 4.5 06/21/2021   Eye exams: Annual, last 03/2020, report not available   Diabetic foot exam was done in 3/22  B12 deficiency: Screening was done as patient requested testing and his level at baseline was only 100 With starting B12 supplement his energy level is significantly better However he is only using half of the dosage on his sublingual liquid B12   Examination:   BP 136/74    Pulse 74    Ht 5' 10" (1.778 m)    Wt 178 lb 3.2 oz (80.8 kg)    SpO2 97%    BMI 25.57 kg/m   Body mass index is 25.57 kg/m.     Assesment/Plan:   Diabetes type 2, nonobese: See history of present illness for detailed discussion of his current blood sugar patterns, problems identified   A1c is relatively higher at 7.2  Currently using the OmniPod insulin with Novolin regular insulin and Metformin in the morning  His blood sugars are better controlled with 87% within the target range  Blood sugar patterns were discussed above Although most of his readings are within target range he still has some inconsistent postprandial control based on timing of insulin, diet and sometimes higher.  Meal readings at dinnertime  Has mild tendency to hypoglycemia overnight or late afternoon with exercise Although his freestyle Elenor Legato is not always correlating with his fingersticks this may be mostly when his blood sugars are rising or falling He prefers generic  medications and insulin  Recommendations: He will change his basal rates as follows 12 AM-4 AM = 0.45, 4 AM-8:30 AM-= 0.65, 8:30 AM = 1.0, 10 AM = 1.25, 2 PM = 1.05.  5 PM = 1.4, 6 PM = 1.65  He will try to make sure he boluses 30 minutes before eating Continue same carbohydrate coverage Trial of HUMALOG instead of regular insulin for better mealtime coverage and delayed drop in blood sugars especially overnight Restart exercise  B12 deficiency: He is subjectively doing better and can go up to 5000 mcg of sublingual B12 to get more therapeutic levels  HYPERTENSION: Well-controlled on losartan, continue same dose  Dyslipidemia: Continues to have low HDL, high triglycerides are not needing treatment and may improve with better diabetes control   Total visit time including counseling = 30 minutes  Patient Instructions  Take full dropper of B12 daily   Follow-up in 3 months   [place] Fassnacht 07/01/2021, 6:00 PM

## 2021-06-30 NOTE — Patient Instructions (Signed)
Take full dropper of B12 daily

## 2021-07-01 ENCOUNTER — Other Ambulatory Visit: Payer: Self-pay | Admitting: Endocrinology

## 2021-07-04 ENCOUNTER — Encounter: Payer: Self-pay | Admitting: Endocrinology

## 2021-07-07 ENCOUNTER — Encounter: Payer: Self-pay | Admitting: Endocrinology

## 2021-07-11 MED ORDER — HUMALOG 100 UNIT/ML IJ SOLN: INTRAMUSCULAR | 2 refills | Status: DC

## 2021-07-13 ENCOUNTER — Encounter: Payer: Self-pay | Admitting: Internal Medicine

## 2021-07-20 LAB — HM DIABETES EYE EXAM

## 2021-07-27 ENCOUNTER — Encounter: Payer: Self-pay | Admitting: Internal Medicine

## 2021-08-14 ENCOUNTER — Ambulatory Visit: Payer: PPO | Admitting: Family Medicine

## 2021-08-14 ENCOUNTER — Ambulatory Visit: Payer: Self-pay

## 2021-08-14 VITALS — BP 142/64 | Ht 70.0 in | Wt 180.0 lb

## 2021-08-14 DIAGNOSIS — G5601 Carpal tunnel syndrome, right upper limb: Secondary | ICD-10-CM

## 2021-08-14 MED ORDER — METHYLPREDNISOLONE ACETATE 40 MG/ML IJ SUSP
40.0000 mg | Freq: Once | INTRAMUSCULAR | Status: AC
  Administered 2021-08-14: 40 mg via INTRA_ARTICULAR

## 2021-08-14 NOTE — Assessment & Plan Note (Signed)
Acutely exacerbated.  He was working with his hands recently that seem to exacerbate his underlying carpal tunnel syndrome.  He was still wants to hold off on surgery at this point. ?-Counseled on home exercise therapy and supportive care. ?-Counseled on brace. ?-Injection today. ?-Could consider EMG. ?

## 2021-08-14 NOTE — Patient Instructions (Signed)
Good to see you ?Please use the brace as needed   ?Please send me a message in MyChart with any questions or updates.  ?Please see me back as needed.  ? ?--Dr. Raeford Razor ? ?

## 2021-08-14 NOTE — Progress Notes (Signed)
?  Manuel Petty - 76 y.o. male MRN [id number]  Date of birth: 03/24/1946 ? ?SUBJECTIVE:  Including CC & ROS.  ?No chief complaint on file. ? ? ?Manuel Petty is a 76 y.o. male that is presenting with acute worsening of his right carpal tunnel syndrome.  His last injection was a year ago.  He has been putting up siding recently that seem to exacerbate his symptoms.  He does use the brace.  Having the symptoms on the palmar aspect of his right hand. ? ? ?Review of Systems ?See HPI  ? ?HISTORY: Past Medical, Surgical, Social, and Family History Reviewed & Updated per EMR.   ?Pertinent Historical Findings include: ? ?Past Medical History:  ?Diagnosis Date  ? Diabetes mellitus ([place]) 2,000  ? Erectile dysfunction   ? HTN (hypertension)   ? Hyperlipidemia   ? Personal history of colonic adenomas 07/18/2009  ? Renal cancer ([place])   ? 1992, has 1 kidney ; does not see urology any more  ? ? ?Past Surgical History:  ?Procedure Laterality Date  ? COLONOSCOPY    ? NEPHRECTOMY  1992  ? ? ? ?PHYSICAL EXAM:  ?VS: BP (!) 142/64   Ht '5\' 10"'$  (1.778 m)   Wt 180 lb (81.6 kg)   BMI 25.83 kg/m?  ?Physical Exam ?Gen: NAD, alert, cooperative with exam, well-appearing ?MSK:  ?Neurovascularly intact   ? ?Limited ultrasound: Right wrist: ? ?Median nerve is measured to be larger than normal. ? ?Summary: Findings consistent with carpal tunnel syndrome. ? ?Ultrasound and interpretation by Clearance Coots, MD ? ? ?Aspiration/Injection Procedure Note ?Manuel Petty ?01-Dec-1945 ? ?Procedure: Injection ?Indications: Right carpal tunnel syndrome ? ?Procedure Details ?Consent: Risks of procedure as well as the alternatives and risks of each were explained to the (patient/caregiver).  Consent for procedure obtained. ?Time Out: Verified patient identification, verified procedure, site/side was marked, verified correct patient position, special equipment/implants available, medications/allergies/relevent history reviewed, required  imaging and test results available.  Performed.  The area was cleaned with iodine and alcohol swabs.   ? ?The right carpal tunnel was injected using 1 cc's of 40 mg Depo-Medrol and 2 cc's of 0.25% bupivacaine with a 25 1 1/2" needle.  Ultrasound was used. Images were obtained in long views showing the injection.   ? ? ?A sterile dressing was applied. ? ?Patient did tolerate procedure well. ? ? ? ?ASSESSMENT & PLAN:  ? ?Carpal tunnel syndrome on right ?Acutely exacerbated.  He was working with his hands recently that seem to exacerbate his underlying carpal tunnel syndrome.  He was still wants to hold off on surgery at this point. ?-Counseled on home exercise therapy and supportive care. ?-Counseled on brace. ?-Injection today. ?-Could consider EMG. ? ? ? ? ?

## 2021-08-30 ENCOUNTER — Other Ambulatory Visit: Payer: Self-pay | Admitting: Endocrinology

## 2021-09-06 ENCOUNTER — Other Ambulatory Visit: Payer: Self-pay

## 2021-09-06 DIAGNOSIS — E1165 Type 2 diabetes mellitus with hyperglycemia: Secondary | ICD-10-CM

## 2021-09-06 MED ORDER — PRAVASTATIN SODIUM 20 MG PO TABS: ORAL_TABLET | ORAL | 3 refills | Status: DC

## 2021-09-12 ENCOUNTER — Other Ambulatory Visit: Payer: Self-pay | Admitting: Internal Medicine

## 2021-09-19 ENCOUNTER — Encounter: Payer: Self-pay | Admitting: Endocrinology

## 2021-09-19 DIAGNOSIS — E1165 Type 2 diabetes mellitus with hyperglycemia: Secondary | ICD-10-CM

## 2021-09-19 MED ORDER — METFORMIN HCL 1000 MG PO TABS: 1000.0000 mg | ORAL_TABLET | Freq: Every day | ORAL | 1 refills | Status: DC

## 2021-09-20 ENCOUNTER — Other Ambulatory Visit (INDEPENDENT_AMBULATORY_CARE_PROVIDER_SITE_OTHER): Payer: PPO

## 2021-09-20 ENCOUNTER — Other Ambulatory Visit: Payer: Self-pay | Admitting: Endocrinology

## 2021-09-20 DIAGNOSIS — E1165 Type 2 diabetes mellitus with hyperglycemia: Secondary | ICD-10-CM | POA: Diagnosis not present

## 2021-09-20 DIAGNOSIS — E538 Deficiency of other specified B group vitamins: Secondary | ICD-10-CM

## 2021-09-20 DIAGNOSIS — Z794 Long term (current) use of insulin: Secondary | ICD-10-CM

## 2021-09-20 LAB — BASIC METABOLIC PANEL
BUN: 12 mg/dL (ref 6–23)
CO2: 25 mEq/L (ref 19–32)
Calcium: 9.1 mg/dL (ref 8.4–10.5)
Chloride: 106 mEq/L (ref 96–112)
Creatinine, Ser: 0.86 mg/dL (ref 0.40–1.50)
GFR: 85.87 mL/min (ref >60.00)
Glucose, Bld: 171 mg/dL — ABNORMAL HIGH (ref 70–99)
Potassium: 4.7 mEq/L (ref 3.5–5.1)
Sodium: 138 mEq/L (ref 135–145)

## 2021-09-20 LAB — VITAMIN B12: Vitamin B-12: 486 pg/mL (ref 211–911)

## 2021-09-20 LAB — HEMOGLOBIN A1C: Hgb A1c MFr Bld: 7 % — ABNORMAL HIGH (ref 4.6–6.5)

## 2021-09-27 ENCOUNTER — Ambulatory Visit: Payer: PPO | Admitting: Endocrinology

## 2021-10-03 ENCOUNTER — Encounter: Payer: Self-pay | Admitting: Internal Medicine

## 2021-10-04 ENCOUNTER — Encounter: Payer: PPO | Admitting: Internal Medicine

## 2021-10-05 ENCOUNTER — Encounter: Payer: Self-pay | Admitting: Endocrinology

## 2021-10-05 ENCOUNTER — Ambulatory Visit (INDEPENDENT_AMBULATORY_CARE_PROVIDER_SITE_OTHER): Payer: PPO | Admitting: Endocrinology

## 2021-10-05 VITALS — BP 140/72 | HR 72 | Ht 70.0 in | Wt 180.6 lb

## 2021-10-05 DIAGNOSIS — E538 Deficiency of other specified B group vitamins: Secondary | ICD-10-CM | POA: Diagnosis not present

## 2021-10-05 DIAGNOSIS — I10 Essential (primary) hypertension: Secondary | ICD-10-CM

## 2021-10-05 DIAGNOSIS — Z794 Long term (current) use of insulin: Secondary | ICD-10-CM

## 2021-10-05 DIAGNOSIS — E1165 Type 2 diabetes mellitus with hyperglycemia: Secondary | ICD-10-CM | POA: Diagnosis not present

## 2021-10-05 NOTE — Progress Notes (Signed)
Patient ID: Manuel Petty, male   DOB: 01-Nov-1945, 76 y.o.   MRN: [id number]   Reason for Appointment: follow-up diabetes  History of Present Illness   Diagnosis: Type 2 DIABETES MELITUS, date of diagnosis: 2007   Previous history: He had been on large doses of Lantus before he was given Victoza in addition and this improved his blood sugar control and reduced his insulin requirement However he subsequently started requiring mealtime coverage with large meals also  RECENT history:  Insulin regimen:   Using OMNIPOD insulin pump since 3/20  Basal settings: 0.65 midnight-8: 30 AM, 8:30 AM = 0.9, 10 AM = 1.1,2 PM = 0.95.  5 PM = 1.3, 6 PM = 1.55  Recommended boluses breakfast 4-6  units, lunch and supper 6-8 units  Recently total daily insulin use about 53 units  Non-insulin hypoglycemic drugs: Metformin 1 g before breakfast  A1c is 7    Current blood sugar patterns and problems identified: Overall blood sugars are fairly well controlled with his recent CGM showing a GMI of 6.5 Previously freestyle libre version 2 readings have been correlated with his fingersticks  He also will periodically comply with current extensive only occasionally will be having a discrepancy  He generally is able to bolus adequately for most of his meals but occasionally has higher carbohydrate meals his sugars may go up including at breakfast Blood sugars are well controlled after dinner  With using Humalog insulin instead of regular insulin he has less delayed low sugars and better postprandial control overall He is more active outside and periodically with continued physical activity in the afternoons he will get low sugars He does not change his insulin pump is suspended with blood sugars going below 60 occasionally He is not aware of the temporary target for basal rate  He does have glucose tablets and appears to treat low sugars appropriately Overall may have relatively higher  fasting readings but overnight readings at times are near normal  Blood sugar pattern interpretation from his CGM download for the last 2 weeks:  Summary of patterns: Blood sugars are overall very evenly controlled with hourly average mostly 120-140 LOWEST readings mid afternoon and highest early morning  POSTPRANDIAL readings appear to be usually fairly consistently even compared to Premeal readings but may be relatively lower after lunch After dinner blood sugars are only occasionally rising over 180 target Also after breakfast may have sporadically high readings  OVERNIGHT readings are somewhat variable but on an average.  Within the range of about 130-140 without hypoglycemia Generally Premeal readings are lowest at dinnertime       Side effects from medications: None             FreeStyle Libre last 2 weeks data:  CGM use % of time   2-week average/GV   Time in range      87, was 70%  % Time Above 180 9+1  % Time above 250   % Time Below 70 3     PRE-MEAL Fasting Lunch Dinner Bedtime Overall  Glucose range:       Averages: 140 138 128  133   POST-MEAL PC Breakfast PC Lunch PC Dinner  Glucose range:     Averages: 141 143 137    Prior   CGM use % of time 96  2-week average/GV 159/25  Time in range    70    %  % Time Above 180 29  % Time above 250   %  Time Below 70 1     PRE-MEAL Fasting Lunch Dinner Bedtime Overall  Glucose range:       Averages: 158  175  159   POST-MEAL PC Breakfast PC Lunch PC Dinner  Glucose range:     Averages:  191 186      Meals:2- 3 meals per day. Lunch 2-3 pm; dinner 6 pm.  For breakfast he will sometimes have English muffin and eggs otherwise only [place] bacon and egg beaters      Dietician visit: Most recent: 06/5364           Complications: are: None     Wt Readings from Last 3 Encounters:  10/05/21 180 lb 9.6 oz (81.9 kg)  08/14/21 180 lb (81.6 kg)  06/30/21 178 lb 3.2 oz (80.8 kg)      Lab Results  Component Value  Date   HGBA1C 7.0 (H) 09/20/2021   HGBA1C 7.2 (H) 06/21/2021   HGBA1C 6.9 (H) 03/15/2021   Lab Results  Component Value Date   MICROALBUR 12.8 (H) 03/15/2021   LDLCALC 43 06/21/2021   CREATININE 0.86 09/20/2021    Other active problems discussed in review of systems   Allergies as of 10/05/2021   No Known Allergies      Medication List        Accurate as of Oct 05, 2021 11:59 PM. If you have any questions, ask your nurse or doctor.          FreeStyle [place] 2 Reader Systm Devi 1 Device by Does not apply route daily.   FreeStyle Libre 2 Sensor Misc USE 1 SENSOR EVERY 14 DAYS TO MONITOR BLOOD SUGAR   FreeStyle Precision Neo Test test strip Generic drug: glucose blood USE TO CHECK BLOOD SUGAR FOR UP TO THREE TIMES DAILY   OneTouch Verio test strip Generic drug: glucose blood Use to check blood blood sugar once a day   HumaLOG 100 UNIT/ML injection Generic drug: insulin lispro Inject 0.68 mLs (68 Units total) into the skin daily.   losartan 50 MG tablet Commonly known as: COZAAR TAKE 1 TABLET BY MOUTH EVERY DAY   metFORMIN 1000 MG tablet Commonly known as: GLUCOPHAGE Take 1 tablet (1,000 mg total) by mouth daily.   niacin 500 MG tablet Take 500 mg by mouth at bedtime.   Omnipod DASH PDM (Gen 4) Kit 1 each by Other route See admin instructions. Use continuously to administer insulin.   Omnipod DASH Pods (Gen 4) Misc CHANGE POD EVERY 72 AS DIRECTED   OneTouch Verio w/Device Kit Use to check blood sugar daily   pravastatin 20 MG tablet Commonly known as: PRAVACHOL TAKE 1 TABLET(20 MG) BY MOUTH DAILY        Allergies: No Known Allergies  Past Medical History:  Diagnosis Date   Diabetes mellitus ([place]) 2,000   Erectile dysfunction    HTN (hypertension)    Hyperlipidemia    Personal history of colonic adenomas 07/18/2009   Renal cancer ([place])    1992, has 1 kidney ; does not see urology any more    Past Surgical History:  Procedure Laterality  Date   COLONOSCOPY     NEPHRECTOMY  1992    Family History  Problem Relation Age of Onset   Diabetes Father    Hypertension Father    CAD Father        F (MI age 74), Brother in hois 52   Dementia Brother        parkinsons? dementia?  Prostate cancer Brother        dx age 74   Cancer Brother        blood cancer    CAD Brother    Colon cancer Neg Hx    Stroke Neg Hx     Social History:  reports that he has quit smoking. He has never used smokeless tobacco. He reports current alcohol use of about 2.0 standard drinks per week. He reports that he does not use drugs.  Review of Systems -     HYPERLIPIDEMIA:   Treated with pravastatin, has had significant low HDL levels in the past  He previously had been on Slo-Niacin OTC 500 mg twice a day for low HDL but had difficulty tolerating this   HDL has been in the 20s along with small LDL particles on previous determinations  Particle number was below 1000 in 06/2014 with increased small particles Triglycerides slightly higher in 2/23   Lab Results  Component Value Date   CHOL 103 06/21/2021   HDL 26.60 (L) 06/21/2021   LDLCALC 43 06/21/2021   LDLDIRECT 45.7 05/31/2009   TRIG 165.0 (H) 06/21/2021   CHOLHDL 4 06/21/2021    He has mild hypertension, taking LOSARTAN 50 mg Previously potassium was high on lisinopril  He monitors BP at home fairly consistent control  BP Readings from Last 3 Encounters:  10/05/21 140/72  08/14/21 (!) 142/64  06/30/21 136/74   Has normal potassium  Lab Results  Component Value Date   K 4.7 09/20/2021   Eye exams: Annual, last 3/23   Diabetic foot exam was done in 3/22  B12 deficiency: Screening was done as patient requested testing and his level at baseline was only 100 With starting B12 supplement his energy level is significantly better Currently using sublingual dose of the liquid   Examination:   BP 140/72   Pulse 72   Ht 5' 10"  (1.778 m)   Wt 180 lb 9.6 oz (81.9 kg)    SpO2 95%   BMI 25.91 kg/m   Body mass index is 25.91 kg/m.   No pedal edema  Assesment/Plan:   Diabetes type 2, nonobese: See history of present illness for detailed discussion of his current blood sugar patterns, problems identified   A1c is 7  Currently using the OmniPod insulin with HUMALOG insulin and Metformin in the morning  His blood sugars are better controlled with 87% within the target range  Blood sugar patterns were discussed above Although most of his readings are within target range he still has some inconsistent postprandial control based on timing of insulin, diet and sometimes higher.  Meal readings at dinnertime  Has mild tendency to hypoglycemia overnight or late afternoon with exercise Although his freestyle Elenor Legato is not always correlating with his fingersticks this may be mostly when his blood sugars are rising or falling He prefers generic medications and insulin  Recommendations: He will continue his basal rates as before  He was instructed on how to use the exercise mode on his pump to reduce his basal rate by up to 70% when he is planning to be more active in the afternoons and set the duration Use the same carbohydrate coverage Continue Humalog insulin as long as he can afford it May lower the afternoon basal rate if blood sugars are more consistently low However if he is planning to be active after lunch he can reduce his bolus 50% also  B12 deficiency: He is subjectively doing better and can go  up to 5000 mcg of sublingual B12 to get more therapeutic levels  HYPERTENSION: Well-controlled on current dose of losartan, also continue to monitor at home and let us know if blood pressure goes up  Periodically monitor lipids   Total visit time including counseling = 30 minutes  There are no Patient Instructions on file for this visit.   Follow-up in 3 months   Elayne Snare 10/09/2021, 11:04 AM

## 2021-10-10 ENCOUNTER — Other Ambulatory Visit: Payer: Self-pay | Admitting: Endocrinology

## 2021-10-10 MED ORDER — FREESTYLE LIBRE 2 SENSOR MISC: 0 refills | Status: DC

## 2021-10-16 ENCOUNTER — Other Ambulatory Visit: Payer: Self-pay

## 2021-10-16 DIAGNOSIS — E1165 Type 2 diabetes mellitus with hyperglycemia: Secondary | ICD-10-CM

## 2021-10-16 MED ORDER — HUMALOG 100 UNIT/ML IJ SOLN: INTRAMUSCULAR | 2 refills | Status: DC

## 2021-10-19 ENCOUNTER — Encounter: Payer: Self-pay | Admitting: Endocrinology

## 2021-11-08 ENCOUNTER — Encounter: Payer: Self-pay | Admitting: Internal Medicine

## 2021-11-08 ENCOUNTER — Ambulatory Visit (INDEPENDENT_AMBULATORY_CARE_PROVIDER_SITE_OTHER): Payer: PPO | Admitting: Internal Medicine

## 2021-11-08 VITALS — BP 145/73 | HR 72 | Temp 97.9°F | Resp 12 | Ht 68.5 in | Wt 176.6 lb

## 2021-11-08 DIAGNOSIS — E785 Hyperlipidemia, unspecified: Secondary | ICD-10-CM | POA: Diagnosis not present

## 2021-11-08 DIAGNOSIS — E1169 Type 2 diabetes mellitus with other specified complication: Secondary | ICD-10-CM

## 2021-11-08 DIAGNOSIS — Z23 Encounter for immunization: Secondary | ICD-10-CM | POA: Diagnosis not present

## 2021-11-08 DIAGNOSIS — Z0000 Encounter for general adult medical examination without abnormal findings: Secondary | ICD-10-CM

## 2021-11-08 DIAGNOSIS — I10 Essential (primary) hypertension: Secondary | ICD-10-CM | POA: Diagnosis not present

## 2021-11-08 DIAGNOSIS — E1159 Type 2 diabetes mellitus with other circulatory complications: Secondary | ICD-10-CM | POA: Diagnosis not present

## 2021-11-08 DIAGNOSIS — I152 Hypertension secondary to endocrine disorders: Secondary | ICD-10-CM | POA: Diagnosis not present

## 2021-11-08 LAB — CBC WITH DIFFERENTIAL/PLATELET
Basophils Absolute: 0 10*3/uL (ref 0.0–0.1)
Basophils Relative: 0.7 % (ref 0.0–3.0)
Eosinophils Absolute: 0.2 10*3/uL (ref 0.0–0.7)
Eosinophils Relative: 4.8 % (ref 0.0–5.0)
HCT: 40.2 % (ref 39.0–52.0)
Hemoglobin: 13.7 g/dL (ref 13.0–17.0)
Lymphocytes Relative: 23.9 % (ref 12.0–46.0)
Lymphs Abs: 1.2 10*3/uL (ref 0.7–4.0)
MCHC: 34 g/dL (ref 30.0–36.0)
MCV: 97.7 fl (ref 78.0–100.0)
Monocytes Absolute: 0.5 10*3/uL (ref 0.1–1.0)
Monocytes Relative: 9.9 % (ref 3.0–12.0)
Neutro Abs: 3 10*3/uL (ref 1.4–7.7)
Neutrophils Relative %: 60.7 % (ref 43.0–77.0)
Platelets: 249 10*3/uL (ref 150.0–400.0)
RBC: 4.11 Mil/uL — ABNORMAL LOW (ref 4.22–5.81)
RDW: 13.9 % (ref 11.5–15.5)
WBC: 5 10*3/uL (ref 4.0–10.5)

## 2021-11-08 LAB — LIPID PANEL
Cholesterol: 96 mg/dL (ref 0–200)
HDL: 26.3 mg/dL — ABNORMAL LOW (ref >39.00)
LDL Cholesterol: 47 mg/dL (ref 0–99)
NonHDL: 69.94
Total CHOL/HDL Ratio: 4
Triglycerides: 117 mg/dL (ref 0.0–149.0)
VLDL: 23.4 mg/dL (ref 0.0–40.0)

## 2021-11-08 NOTE — Assessment & Plan Note (Signed)
Here for CPX DM: Follow-up by Dr. Dwyane Dee. HTN: On losartan, ambulatory BPs range from 116-123.  No change Hyperlipidemia: On Pravachol, last LDL 43.  Recheck FLP FH CAD: The patient has multiple risk factors for CAD and a FH (father, brother).  Currently not on aspirin. EKG today: NSR.  No acute changes Pros and cons of a stress test discussed.  He could have silent ischemia given longstanding DM. Verbalized understanding of my advance but stated he will think about it RTC 1 year

## 2021-11-08 NOTE — Patient Instructions (Signed)
Check the  blood pressure regularly BP GOAL is between 110/65 and  135/85. If it is consistently higher or lower, let me know   You are due for a colonoscopy  GO TO THE LAB : Get the blood work     [place], [place] back for   a physical exam in 1 year     "Living will", "[place] of attorney": Advanced care planning  (If you already have a living will or healthcare power of attorney, please bring the copy to be scanned in your chart.)  Advance care planning is a process that supports adults in  understanding and sharing their preferences regarding future medical care.   The patient's preferences are recorded in documents called Advance Directives.    Advanced directives are completed (and can be modified at any time) while the patient is in full mental capacity.   The documentation should be available at all times to the patient, the family and the healthcare providers.  Bring in a copy to be scanned in your chart is an excellent idea and is recommended   This legal documents direct treatment decision making and/or appoint a surrogate to make the decision if the patient is not capable to do so.    Advance directives can be documented in many types of formats,  documents have names such as:  Lliving will  Durable power of attorney for healthcare (healthcare proxy or healthcare power of attorney)  Combined directives  Physician orders for life-sustaining treatment    More information at:  [email or web address]

## 2021-11-08 NOTE — Assessment & Plan Note (Signed)
-   Td 09/2017 -  pneumonia shot 2016, prevnar 2016.  PNM 20: today - zostavax : 2012; s/p  shingrex  -COVID VAX: Booster indicated.  Patient aware -Rec flu shot every fall -- [facility]  colonoscopy 3-11, multiple polyps, cscope 08-2016, DR Carlean Purl, due for repeat colonoscopy, GI letter reprinted for patient. -Prostate ca screening: brother dx w/ prostate ca age 74, no sxs, last PSA 06/2021 wnl.  DRE today normal -Lifestyle: Active, recommend to exercise regularly --  Labs reviewed.  Check CBC, FLP -POA: See AVS

## 2021-11-08 NOTE — Progress Notes (Signed)
Subjective:    Patient ID: Manuel Petty, male    DOB: 1945/11/25, 76 y.o.   MRN: [id number]  DOS:  11/08/2021 Type of visit - description: CPX  Last office visit 02-2020, here for CPX Since the last visit is doing well. A family member died from [place], he is concerned.  He denies any fever, weight loss or night sweats. Ambulatory BPs reviewed they are great.  Review of Systems  Other than above, a 14 point review of systems is negative    Past Medical History:  Diagnosis Date   Diabetes mellitus ([place]) 2,000   Erectile dysfunction    HTN (hypertension)    Hyperlipidemia    Personal history of colonic adenomas 07/18/2009   Renal cancer ([place])    1992, has 1 kidney ; does not see urology any more    Past Surgical History:  Procedure Laterality Date   COLONOSCOPY     NEPHRECTOMY  1992   Social History   Socioeconomic History   Marital status: Married    Spouse name: Not on file   Number of children: 1   Years of education: Not on file   Highest education level: Not on file  Occupational History   Occupation: retired   Tobacco Use   Smoking status: Former   Smokeless tobacco: Never   Tobacco comments:    quit in 1992, 2 ppd  Substance and Sexual Activity   Alcohol use: Yes    Alcohol/week: 2.0 standard drinks of alcohol    Types: 2 Cans of beer per week   Drug use: No    Comment: used to use marihuana     Sexual activity: Not on file  Other Topics Concern   Not on file  Social History Narrative   Lives w/ wife   Social Determinants of Health   Financial Resource Strain: Not on file  Food Insecurity: Not on file  Transportation Needs: Not on file  Physical Activity: Not on file  Stress: Not on file  Social Connections: Not on file  Intimate Partner Violence: Not on file     Current Outpatient Medications  Medication Instructions   Blood Glucose Monitoring Suppl (ONETOUCH VERIO) w/Device KIT Use to check blood sugar daily   Continuous  Blood Gluc Receiver (FREESTYLE LIBRE 2 READER SYSTM) DEVI 1 Device, Does not apply, Daily   Continuous Blood Gluc Sensor (FREESTYLE LIBRE 2 SENSOR) MISC Change every 14 days   FREESTYLE PRECISION NEO TEST test strip USE TO CHECK BLOOD SUGAR FOR UP TO THREE TIMES DAILY   HUMALOG 100 UNIT/ML injection Inject 0.68 mLs (68 Units total) into the skin daily.   Insulin Disposable Pump (OMNIPOD DASH PODS, GEN 4,) MISC CHANGE POD EVERY 72 AS DIRECTED   Insulin Disposable Pump (OMNIPOD DASH SYSTEM) KIT 1 each, Other, See admin instructions, Use continuously to administer insulin.    losartan (COZAAR) 50 MG tablet TAKE 1 TABLET BY MOUTH EVERY DAY   metFORMIN (GLUCOPHAGE) 1,000 mg, Oral, Daily   niacin 500 mg, Oral, Daily at bedtime   pravastatin (PRAVACHOL) 20 MG tablet TAKE 1 TABLET(20 MG) BY MOUTH DAILY       Objective:   Physical Exam BP (!) 145/73 (BP Location: Left Arm, Cuff Size: Large)   Pulse 72   Temp 97.9 F (36.6 C) (Oral)   Resp 12   Ht 5' 8.5" (1.74 m)   Wt 176 lb 9.6 oz (80.1 kg)   SpO2 98%   BMI 26.46 kg/m  General: Well developed, NAD, BMI noted Neck: No  thyromegaly  HEENT:  Normocephalic . Face symmetric, atraumatic Lymphatic system: No LAD sided neck, supraclavicular areas or underarm Lungs:  CTA B Normal respiratory effort, no intercostal retractions, no accessory muscle use. Heart: RRR,  no murmur.  Abdomen:  Not distended, soft, non-tender. No rebound or rigidity. DRE: Normal prostate Lower extremities: no pretibial edema bilaterally  Skin: Exposed areas without rash. Not pale. Not jaundice Neurologic:  alert & oriented X3.  Speech normal, gait appropriate for age and unassisted Strength symmetric and appropriate for age.  Psych: Cognition and judgment appear intact.  Cooperative with normal attention span and concentration.  Behavior appropriate. No anxious or depressed appearing.     Assessment     Assessment DM--dr  Dwyane Dee HTN Hyperlipidemia. E.D. Renal cancer, 1992, nephrectomy x1,, released from urology B12 deficiency   PLAN: Here for CPX DM: Follow-up by Dr. Dwyane Dee. HTN: On losartan, ambulatory BPs range from 116-123.  No change Hyperlipidemia: On Pravachol, last LDL 43.  Recheck FLP FH CAD: The patient has multiple risk factors for CAD and a FH (father, brother).  Currently not on aspirin. EKG today: NSR.  No acute changes Pros and cons of a stress test discussed.  He could have silent ischemia given longstanding DM. Verbalized understanding of my advance but stated he will think about it RTC 1 year

## 2021-12-08 ENCOUNTER — Encounter: Payer: Self-pay | Admitting: Endocrinology

## 2021-12-08 DIAGNOSIS — E1165 Type 2 diabetes mellitus with hyperglycemia: Secondary | ICD-10-CM

## 2021-12-11 MED ORDER — FREESTYLE LIBRE 3 SENSOR MISC: 3 refills | Status: DC

## 2021-12-11 NOTE — Addendum Note (Signed)
Addended by: Cinda Quest on: 12/11/2021 09:51 AM   Modules accepted: Orders

## 2021-12-23 ENCOUNTER — Encounter: Payer: Self-pay | Admitting: Endocrinology

## 2022-01-05 ENCOUNTER — Other Ambulatory Visit: Payer: Self-pay | Admitting: Endocrinology

## 2022-01-05 DIAGNOSIS — Z794 Long term (current) use of insulin: Secondary | ICD-10-CM

## 2022-01-09 ENCOUNTER — Encounter: Payer: Self-pay | Admitting: Endocrinology

## 2022-01-09 ENCOUNTER — Encounter: Payer: Self-pay | Admitting: Internal Medicine

## 2022-01-10 ENCOUNTER — Other Ambulatory Visit: Payer: Self-pay | Admitting: Endocrinology

## 2022-01-10 DIAGNOSIS — E538 Deficiency of other specified B group vitamins: Secondary | ICD-10-CM

## 2022-01-16 ENCOUNTER — Other Ambulatory Visit (INDEPENDENT_AMBULATORY_CARE_PROVIDER_SITE_OTHER): Payer: PPO

## 2022-01-16 DIAGNOSIS — Z794 Long term (current) use of insulin: Secondary | ICD-10-CM | POA: Diagnosis not present

## 2022-01-16 DIAGNOSIS — E538 Deficiency of other specified B group vitamins: Secondary | ICD-10-CM

## 2022-01-16 DIAGNOSIS — E1165 Type 2 diabetes mellitus with hyperglycemia: Secondary | ICD-10-CM | POA: Diagnosis not present

## 2022-01-16 LAB — BASIC METABOLIC PANEL
BUN: 17 mg/dL (ref 6–23)
CO2: 24 mEq/L (ref 19–32)
Calcium: 8.9 mg/dL (ref 8.4–10.5)
Chloride: 106 mEq/L (ref 96–112)
Creatinine, Ser: 0.79 mg/dL (ref 0.40–1.50)
GFR: 87.9 mL/min (ref >60.00)
Glucose, Bld: 109 mg/dL — ABNORMAL HIGH (ref 70–99)
Potassium: 4.6 mEq/L (ref 3.5–5.1)
Sodium: 138 mEq/L (ref 135–145)

## 2022-01-16 LAB — CBC
HCT: 40.4 % (ref 39.0–52.0)
Hemoglobin: 13.5 g/dL (ref 13.0–17.0)
MCHC: 33.3 g/dL (ref 30.0–36.0)
MCV: 96.8 fl (ref 78.0–100.0)
Platelets: 240 10*3/uL (ref 150.0–400.0)
RBC: 4.17 Mil/uL — ABNORMAL LOW (ref 4.22–5.81)
RDW: 13.6 % (ref 11.5–15.5)
WBC: 6.7 10*3/uL (ref 4.0–10.5)

## 2022-01-16 LAB — HEMOGLOBIN A1C: Hgb A1c MFr Bld: 6.7 % — ABNORMAL HIGH (ref 4.6–6.5)

## 2022-01-23 ENCOUNTER — Ambulatory Visit: Payer: PPO | Admitting: Endocrinology

## 2022-01-23 ENCOUNTER — Encounter: Payer: Self-pay | Admitting: Endocrinology

## 2022-01-23 VITALS — BP 150/84 | HR 78 | Ht 68.5 in | Wt 175.4 lb

## 2022-01-23 DIAGNOSIS — E538 Deficiency of other specified B group vitamins: Secondary | ICD-10-CM | POA: Diagnosis not present

## 2022-01-23 DIAGNOSIS — E1165 Type 2 diabetes mellitus with hyperglycemia: Secondary | ICD-10-CM

## 2022-01-23 DIAGNOSIS — Z794 Long term (current) use of insulin: Secondary | ICD-10-CM | POA: Diagnosis not present

## 2022-01-23 DIAGNOSIS — I10 Essential (primary) hypertension: Secondary | ICD-10-CM

## 2022-01-23 NOTE — Progress Notes (Unsigned)
Patient ID: Manuel Petty, male   DOB: 17-Jul-1945, 76 y.o.   MRN: [id number]   Reason for Appointment: follow-up diabetes  History of Present Illness   Diagnosis: Type 2 DIABETES MELITUS, date of diagnosis: 2007   Previous history: He had been on large doses of Lantus before he was given Victoza in addition and this improved his blood sugar control and reduced his insulin requirement However he subsequently started requiring mealtime coverage with large meals also  RECENT history:  Insulin regimen:   Using OMNIPOD insulin pump since 3/20  Basal settings: 0.65 midnight-8: 30 AM, 8:30 AM = 0.9, 10 AM = 1.1,2 PM = 0.95.  5 PM = 1.3, 6 PM = 1.55  Recommended boluses breakfast 4-6  units, lunch and supper 6-8 units  Recently total daily insulin use about 53 units  Non-insulin hypoglycemic drugs: Metformin 1 g before breakfast  A1c is 7  Current blood sugar patterns and problems identified: Overall blood sugars are fairly well controlled with his recent CGM showing a GMI of 6.5 Previously freestyle libre version 2 readings have been correlated with his fingersticks  He also will periodically comply with current extensive only occasionally will be having a discrepancy  He generally is able to bolus adequately for most of his meals but occasionally has higher carbohydrate meals his sugars may go up including at breakfast Blood sugars are well controlled after dinner  With using Humalog insulin instead of regular insulin he has less delayed low sugars and better postprandial control overall He is more active outside and periodically with continued physical activity in the afternoons he will get low sugars He does not change his insulin pump is suspended with blood sugars going below 60 occasionally He is not aware of the temporary target for basal rate  He does have glucose tablets and appears to treat low sugars appropriately Overall may have relatively higher fasting  readings but overnight readings at times are near normal  Blood sugar pattern interpretation from his CGM download for the last 2 weeks:  Summary of patterns: Blood sugars are overall very evenly controlled with hourly average mostly 120-140 LOWEST readings mid afternoon and highest early morning  POSTPRANDIAL readings appear to be usually fairly consistently even compared to Premeal readings but may be relatively lower after lunch After dinner blood sugars are only occasionally rising over 180 target Also after breakfast may have sporadically high readings  OVERNIGHT readings are somewhat variable but on an average.  Within the range of about 130-140 without hypoglycemia Generally Premeal readings are lowest at dinnertime       Side effects from medications: None             FreeStyle Libre last 2 weeks data:   CGM use % of time   2-week average/GV   Time in range        %  % Time Above 180   % Time above 250   % Time Below 70      PRE-MEAL Fasting Lunch Dinner Bedtime Overall  Glucose range:       Averages:   11     POST-MEAL PC Breakfast PC Lunch PC Dinner  Glucose range:     Averages:         CGM use % of time   2-week average/GV   Time in range      87, was 70%  % Time Above 180 9+1  % Time above 250   %  Time Below 70 3     PRE-MEAL Fasting Lunch Dinner Bedtime Overall  Glucose range:       Averages: 140 138 128  133   POST-MEAL PC Breakfast PC Lunch PC Dinner  Glucose range:     Averages: 141 143 137        Meals:2- 3 meals per day. Lunch 2-3 pm; dinner 6 pm.  For breakfast he will sometimes have English muffin and eggs otherwise only [place] bacon and egg beaters      Dietician visit: Most recent: 09/930           Complications: are: None     Wt Readings from Last 3 Encounters:  01/23/22 175 lb 6.4 oz (79.6 kg)  11/08/21 176 lb 9.6 oz (80.1 kg)  10/05/21 180 lb 9.6 oz (81.9 kg)      Lab Results  Component Value Date   HGBA1C 6.7 (H)  01/16/2022   HGBA1C 7.0 (H) 09/20/2021   HGBA1C 7.2 (H) 06/21/2021   Lab Results  Component Value Date   MICROALBUR 12.8 (H) 03/15/2021   LDLCALC 47 11/08/2021   CREATININE 0.79 01/16/2022    Other active problems discussed in review of systems   Allergies as of 01/23/2022   No Known Allergies      Medication List        Accurate as of January 23, 2022 11:20 AM. If you have any questions, ask your nurse or doctor.          FreeStyle [place] 2 Reader Systm Devi 1 Device by Does not apply route daily.   FreeStyle Libre 2 Sensor Misc Change every 14 days   FreeStyle Libre 3 Sensor Misc Place 1 sensor on the skin every 14 days. Use to check glucose continuously   FreeStyle Precision Neo Test test strip Generic drug: glucose blood USE TO CHECK BLOOD SUGAR FOR UP TO THREE TIMES DAILY   HumaLOG 100 UNIT/ML injection Generic drug: insulin lispro ADMINISTER 68 UNITS UNDER THE SKIN DAILY   losartan 50 MG tablet Commonly known as: COZAAR TAKE 1 TABLET BY MOUTH EVERY DAY   metFORMIN 1000 MG tablet Commonly known as: GLUCOPHAGE Take 1 tablet (1,000 mg total) by mouth daily.   niacin 500 MG tablet Commonly known as: VITAMIN B3 Take 500 mg by mouth at bedtime.   Omnipod DASH PDM (Gen 4) Kit 1 each by Other route See admin instructions. Use continuously to administer insulin.   Omnipod DASH Pods (Gen 4) Misc CHANGE POD EVERY 72 AS DIRECTED   OneTouch Verio w/Device Kit Use to check blood sugar daily   pravastatin 20 MG tablet Commonly known as: PRAVACHOL TAKE 1 TABLET(20 MG) BY MOUTH DAILY        Allergies: No Known Allergies  Past Medical History:  Diagnosis Date   Diabetes mellitus ([place]) 2,000   Erectile dysfunction    HTN (hypertension)    Hyperlipidemia    Personal history of colonic adenomas 07/18/2009   Renal cancer ([place])    1992, has 1 kidney ; does not see urology any more    Past Surgical History:  Procedure Laterality Date   COLONOSCOPY      NEPHRECTOMY  1992    Family History  Problem Relation Age of Onset   Diabetes Father    Hypertension Father    CAD Father        F (MI age 54), Brother in hois 85   Lymphoma Sister    Dementia Brother  parkinsons? dementia?   Prostate cancer Brother        dx age 15   Cancer Brother        blood cancer    CAD Brother    Colon cancer Neg Hx    Stroke Neg Hx     Social History:  reports that he has quit smoking. He has never used smokeless tobacco. He reports current alcohol use of about 2.0 standard drinks of alcohol per week. He reports that he does not use drugs.  Review of Systems -     HYPERLIPIDEMIA:   Treated with pravastatin, has had significant low HDL levels in the past  He previously had been on Slo-Niacin OTC 500 mg twice a day for low HDL but had difficulty tolerating this   HDL has been in the 20s along with small LDL particles on previous determinations  Particle number was below 1000 in 06/2014 with increased small particles Triglycerides slightly higher in 2/23   Lab Results  Component Value Date   CHOL 96 11/08/2021   HDL 26.30 (L) 11/08/2021   LDLCALC 47 11/08/2021   LDLDIRECT 45.7 05/31/2009   TRIG 117.0 11/08/2021   CHOLHDL 4 11/08/2021    He has mild hypertension, taking LOSARTAN 50 mg Previously potassium was high on lisinopril  He monitors BP at home 130 avg  BP Readings from Last 3 Encounters:  01/23/22 (!) 150/84  11/08/21 (!) 145/73  10/05/21 140/72   Has normal potassium  Lab Results  Component Value Date   K 4.6 01/16/2022   Eye exams: Annual, last 3/23   Diabetic foot exam was done in 3/22  B12 deficiency: Screening was done as patient requested testing and his level at baseline was only 100 With starting B12 supplement his energy level is significantly better Currently using sublingual dose of the liquid   Examination:   BP (!) 150/84   Pulse 78   Ht 5' 8.5" (1.74 m)   Wt 175 lb 6.4 oz (79.6 kg)    SpO2 95%   BMI 26.28 kg/m   Body mass index is 26.28 kg/m.     Assesment/Plan:   Diabetes type 2, nonobese: See history of present illness for detailed discussion of his current blood sugar patterns, problems identified   A1c is 7  Currently using the OmniPod insulin with HUMALOG insulin and Metformin in the morning  His blood sugars are better controlled with 87% within the target range  Blood sugar patterns were discussed above Although most of his readings are within target range he still has some inconsistent postprandial control based on timing of insulin, diet and sometimes higher.  Meal readings at dinnertime  Has mild tendency to hypoglycemia overnight or late afternoon with exercise Although his freestyle Elenor Legato is not always correlating with his fingersticks this may be mostly when his blood sugars are rising or falling He prefers generic medications and insulin  Recommendations: He will continue his basal rates as before  He was instructed on how to use the exercise mode on his pump to reduce his basal rate by up to 70% when he is planning to be more active in the afternoons and set the duration Use the same carbohydrate coverage Continue Humalog insulin as long as he can afford it May lower the afternoon basal rate if blood sugars are more consistently low However if he is planning to be active after lunch he can reduce his bolus 50% also  B12 deficiency: He is subjectively doing  better and can go up to 5000 mcg of sublingual B12 to get more therapeutic levels  HYPERTENSION: Well-controlled on current dose of losartan, also continue to monitor at home and let us know if blood pressure goes up  Periodically monitor lipids   Total visit time including counseling = 30 minutes  There are no Patient Instructions on file for this visit.   Follow-up in 3 months   Elayne Snare 01/23/2022, 11:20 AM

## 2022-01-23 NOTE — Patient Instructions (Signed)
Temp basal when being active

## 2022-01-26 ENCOUNTER — Encounter: Payer: Self-pay | Admitting: Endocrinology

## 2022-02-09 ENCOUNTER — Other Ambulatory Visit (HOSPITAL_BASED_OUTPATIENT_CLINIC_OR_DEPARTMENT_OTHER): Payer: Self-pay

## 2022-02-09 MED ORDER — INFLUENZA VAC A&B SA ADJ QUAD 0.5 ML IM PRSY
PREFILLED_SYRINGE | INTRAMUSCULAR | 0 refills | Status: DC
  Filled 2022-02-09: qty 0.5, 1d supply, fill #0

## 2022-02-13 ENCOUNTER — Ambulatory Visit: Payer: PPO | Admitting: Family Medicine

## 2022-02-13 ENCOUNTER — Encounter: Payer: Self-pay | Admitting: Family Medicine

## 2022-02-13 ENCOUNTER — Ambulatory Visit: Payer: Self-pay

## 2022-02-13 VITALS — BP 140/80 | Ht 68.5 in | Wt 180.0 lb

## 2022-02-13 DIAGNOSIS — G5602 Carpal tunnel syndrome, left upper limb: Secondary | ICD-10-CM

## 2022-02-13 DIAGNOSIS — M7551 Bursitis of right shoulder: Secondary | ICD-10-CM

## 2022-02-13 MED ORDER — BETAMETHASONE SOD PHOS & ACET 6 (3-3) MG/ML IJ SUSP
6.0000 mg | Freq: Once | INTRAMUSCULAR | Status: AC
  Administered 2022-02-13: 6 mg via INTRA_ARTICULAR

## 2022-02-13 NOTE — Assessment & Plan Note (Signed)
Acutely occurring with enlargement of the median nerve.  He is wanting to hold off on surgery if at all possible. -Counseled on home exercise therapy and supportive care. -Injection today.

## 2022-02-13 NOTE — Patient Instructions (Signed)
Good to see you Please use ice on the shoulder as needed  Please try the exercises  Please send me a message in MyChart with any questions or updates.  Please see me back in 6 weeks or as needed if better.   --Dr. Raeford Razor

## 2022-02-13 NOTE — Progress Notes (Signed)
  Manuel Petty - 76 y.o. male MRN [id number]  Date of birth: August 25, 1945  SUBJECTIVE:  Including CC & ROS.  No chief complaint on file.   Manuel Petty is a 76 y.o. male that is presenting with acute on chronic left carpal tunnel syndrome.  Presenting with acute right shoulder pain.    Review of Systems See HPI   HISTORY: Past Medical, Surgical, Social, and Family History Reviewed & Updated per EMR.   Pertinent Historical Findings include:  Past Medical History:  Diagnosis Date   Diabetes mellitus ([place]) 2,000   Erectile dysfunction    HTN (hypertension)    Hyperlipidemia    Personal history of colonic adenomas 07/18/2009   Renal cancer ([place])    1992, has 1 kidney ; does not see urology any more    Past Surgical History:  Procedure Laterality Date   COLONOSCOPY     NEPHRECTOMY  1992     PHYSICAL EXAM:  VS: BP (!) 140/80 (BP Location: Left Arm, Patient Position: Sitting)   Ht 5' 8.5" (1.74 m)   Wt 180 lb (81.6 kg)   BMI 26.97 kg/m  Physical Exam Gen: NAD, alert, cooperative with exam, well-appearing MSK:  Neurovascularly intact    Limited ultrasound: Left wrist:  Enlargement of the median nerve within the carpal tunnel  Summary: Findings consistent with carpal tunnel syndrome  Ultrasound and interpretation by Clearance Coots, MD  Aspiration/Injection Procedure Note Manuel Petty 11/28/1945  Procedure: Injection Indications: Left carpal tunnel syndrome  Procedure Details Consent: Risks of procedure as well as the alternatives and risks of each were explained to the (patient/caregiver).  Consent for procedure obtained. Time Out: Verified patient identification, verified procedure, site/side was marked, verified correct patient position, special equipment/implants available, medications/allergies/relevent history reviewed, required imaging and test results available.  Performed.  The area was cleaned with iodine and alcohol swabs.    The  left carpal tunnel was injected using 2 cc of normal saline, 2 cc of D5W,1 cc's of 6 mg betamethasone and 2 cc's of 0.25% bupivacaine was injected with a 25 1 1/2" needle.  Ultrasound was used. Images were obtained in long views showing the injection.     A sterile dressing was applied.  Patient did tolerate procedure well.      ASSESSMENT & PLAN:   Carpal tunnel syndrome, left Acutely occurring with enlargement of the median nerve.  He is wanting to hold off on surgery if at all possible. -Counseled on home exercise therapy and supportive care. -Injection today.  Subacromial bursitis of right shoulder joint Acutely occurring.  Having more pain in the middle of the night with the way that he sleeps. -Counseled on home exercise therapy and supportive care. -Could consider injection or physical therapy.

## 2022-02-13 NOTE — Assessment & Plan Note (Signed)
Acutely occurring.  Having more pain in the middle of the night with the way that he sleeps. -Counseled on home exercise therapy and supportive care. -Could consider injection or physical therapy.

## 2022-02-14 ENCOUNTER — Encounter: Payer: Self-pay | Admitting: Endocrinology

## 2022-03-09 ENCOUNTER — Other Ambulatory Visit (HOSPITAL_BASED_OUTPATIENT_CLINIC_OR_DEPARTMENT_OTHER): Payer: Self-pay

## 2022-03-09 MED ORDER — COMIRNATY 30 MCG/0.3ML IM SUSY
PREFILLED_SYRINGE | INTRAMUSCULAR | 0 refills | Status: DC
Start: 2022-03-09 — End: 2022-12-19
  Filled 2022-03-09: qty 0.3, 1d supply, fill #0

## 2022-03-16 ENCOUNTER — Other Ambulatory Visit: Payer: Self-pay | Admitting: Endocrinology

## 2022-03-16 DIAGNOSIS — E1165 Type 2 diabetes mellitus with hyperglycemia: Secondary | ICD-10-CM

## 2022-03-22 ENCOUNTER — Ambulatory Visit: Payer: Self-pay

## 2022-03-22 ENCOUNTER — Ambulatory Visit (INDEPENDENT_AMBULATORY_CARE_PROVIDER_SITE_OTHER): Payer: PPO | Admitting: Family Medicine

## 2022-03-22 VITALS — BP 130/68 | Ht 70.0 in | Wt 179.0 lb

## 2022-03-22 DIAGNOSIS — G5601 Carpal tunnel syndrome, right upper limb: Secondary | ICD-10-CM | POA: Diagnosis not present

## 2022-03-22 MED ORDER — BETAMETHASONE SOD PHOS & ACET 6 (3-3) MG/ML IJ SUSP
6.0000 mg | Freq: Once | INTRAMUSCULAR | Status: AC
  Administered 2022-03-22: 6 mg via INTRAMUSCULAR

## 2022-03-22 NOTE — Addendum Note (Signed)
Addended by: Cresenciano Lick on: 03/22/2022 04:07 PM   Modules accepted: Orders

## 2022-03-22 NOTE — Patient Instructions (Signed)
Good to see you Please continue the exercises   Please send me a message in MyChart with any questions or updates.  Please see me back as needed.   --Dr. Raeford Razor

## 2022-03-22 NOTE — Assessment & Plan Note (Signed)
Acutely on chronic in nature. Has received injections in the past and would like to hold off on surgery at this time.  - counseled on home exercise therapy and supportive care - injection today

## 2022-03-22 NOTE — Progress Notes (Signed)
  Manuel Petty - 76 y.o. male MRN [id number]  Date of birth: 1945/05/17  SUBJECTIVE:  Including CC & ROS.  No chief complaint on file.   Manuel Petty is a 76 y.o. male that is  presenting with acute worsening of his right wrist pain. He was doing some painting recently that seemed to exacerbate his symptoms.    Review of Systems See HPI   HISTORY: Past Medical, Surgical, Social, and Family History Reviewed & Updated per EMR.   Pertinent Historical Findings include:  Past Medical History:  Diagnosis Date   Diabetes mellitus ([place]) 2,000   Erectile dysfunction    HTN (hypertension)    Hyperlipidemia    Personal history of colonic adenomas 07/18/2009   Renal cancer ([place])    1992, has 1 kidney ; does not see urology any more    Past Surgical History:  Procedure Laterality Date   COLONOSCOPY     NEPHRECTOMY  1992     PHYSICAL EXAM:  VS: BP 130/68   Ht '5\' 10"'$  (1.778 m)   Wt 179 lb (81.2 kg)   BMI 25.68 kg/m  Physical Exam Gen: NAD, alert, cooperative with exam, well-appearing MSK:  Neurovascularly intact    Limited ultrasound: right wrist:  The medium nerve appears to be enlarged and flattened   Summary: findings consistent with carpal tunnel syndrome.   Ultrasound and interpretation by Clearance Coots, MD   Aspiration/Injection Procedure Note Manuel Petty 1945-06-26  Procedure: Injection Indications: right wrist pain  Procedure Details Consent: Risks of procedure as well as the alternatives and risks of each were explained to the (patient/caregiver).  Consent for procedure obtained. Time Out: Verified patient identification, verified procedure, site/side was marked, verified correct patient position, special equipment/implants available, medications/allergies/relevent history reviewed, required imaging and test results available.  Performed.  The area was cleaned with iodine and alcohol swabs.    The right median nerve in the carpal  tunnel was injected using 2 cc of normal saline, 2 cc of D5W and 1 cc's of 6 mg betamethasone and 2 cc's of 0.25% bupivacaine with a 25 1 1/2" needle.  Ultrasound was used. Images were obtained in long views showing the injection.     A sterile dressing was applied.  Patient did tolerate procedure well.    ASSESSMENT & PLAN:   Carpal tunnel syndrome on right Acutely on chronic in nature. Has received injections in the past and would like to hold off on surgery at this time.  - counseled on home exercise therapy and supportive care - injection today

## 2022-04-13 ENCOUNTER — Other Ambulatory Visit: Payer: Self-pay | Admitting: Endocrinology

## 2022-04-13 DIAGNOSIS — E1165 Type 2 diabetes mellitus with hyperglycemia: Secondary | ICD-10-CM

## 2022-04-16 ENCOUNTER — Other Ambulatory Visit: Payer: Self-pay | Admitting: Endocrinology

## 2022-04-16 DIAGNOSIS — E1165 Type 2 diabetes mellitus with hyperglycemia: Secondary | ICD-10-CM

## 2022-05-09 ENCOUNTER — Encounter: Payer: Self-pay | Admitting: Endocrinology

## 2022-05-22 ENCOUNTER — Other Ambulatory Visit (INDEPENDENT_AMBULATORY_CARE_PROVIDER_SITE_OTHER): Payer: PPO

## 2022-05-22 DIAGNOSIS — E1165 Type 2 diabetes mellitus with hyperglycemia: Secondary | ICD-10-CM

## 2022-05-22 DIAGNOSIS — Z794 Long term (current) use of insulin: Secondary | ICD-10-CM

## 2022-05-22 DIAGNOSIS — E538 Deficiency of other specified B group vitamins: Secondary | ICD-10-CM

## 2022-05-22 LAB — MICROALBUMIN / CREATININE URINE RATIO
Creatinine,U: 55.8 mg/dL
Microalb Creat Ratio: 8.6 mg/g (ref 0.0–30.0)
Microalb, Ur: 4.8 mg/dL — ABNORMAL HIGH (ref 0.0–1.9)

## 2022-05-22 LAB — BASIC METABOLIC PANEL
BUN: 17 mg/dL (ref 6–23)
CO2: 25 mEq/L (ref 19–32)
Calcium: 8.5 mg/dL (ref 8.4–10.5)
Chloride: 107 mEq/L (ref 96–112)
Creatinine, Ser: 0.81 mg/dL (ref 0.40–1.50)
GFR: 87.03 mL/min (ref >60.00)
Glucose, Bld: 97 mg/dL (ref 70–99)
Potassium: 4.6 mEq/L (ref 3.5–5.1)
Sodium: 139 mEq/L (ref 135–145)

## 2022-05-22 LAB — HEMOGLOBIN A1C: Hgb A1c MFr Bld: 6.6 % — ABNORMAL HIGH (ref 4.6–6.5)

## 2022-05-22 LAB — VITAMIN B12: Vitamin B-12: 209 pg/mL — ABNORMAL LOW (ref 211–911)

## 2022-05-29 ENCOUNTER — Ambulatory Visit (INDEPENDENT_AMBULATORY_CARE_PROVIDER_SITE_OTHER): Payer: PPO | Admitting: Endocrinology

## 2022-05-29 ENCOUNTER — Telehealth: Payer: Self-pay | Admitting: Internal Medicine

## 2022-05-29 ENCOUNTER — Encounter: Payer: Self-pay | Admitting: Endocrinology

## 2022-05-29 VITALS — BP 148/76 | HR 82 | Ht 70.0 in | Wt 175.8 lb

## 2022-05-29 DIAGNOSIS — E782 Mixed hyperlipidemia: Secondary | ICD-10-CM

## 2022-05-29 DIAGNOSIS — E538 Deficiency of other specified B group vitamins: Secondary | ICD-10-CM | POA: Diagnosis not present

## 2022-05-29 DIAGNOSIS — Z794 Long term (current) use of insulin: Secondary | ICD-10-CM | POA: Diagnosis not present

## 2022-05-29 DIAGNOSIS — E119 Type 2 diabetes mellitus without complications: Secondary | ICD-10-CM | POA: Diagnosis not present

## 2022-05-29 DIAGNOSIS — I10 Essential (primary) hypertension: Secondary | ICD-10-CM | POA: Diagnosis not present

## 2022-05-29 MED ORDER — LOSARTAN POTASSIUM 50 MG PO TABS: 75.0000 mg | ORAL_TABLET | Freq: Every day | ORAL | 3 refills | Status: DC

## 2022-05-29 NOTE — Telephone Encounter (Signed)
Copied from [place] [id number]. Topic: Medicare AWV >> May 29, 2022 12:41 PM Gillis Santa wrote: Reason for CRM:    LVM   PATIENT CALL [place]

## 2022-05-29 NOTE — Progress Notes (Signed)
Patient ID: Manuel Petty, male   DOB: [birth year], 77 y.o.   MRN: [id number]   Reason for Appointment: follow-up diabetes  History of Present Illness   Diagnosis: Type 2 DIABETES MELITUS, date of diagnosis: 2007   Previous history: He had been on large doses of Lantus before he was given Victoza in addition and this improved his blood sugar control and reduced his insulin requirement However he subsequently started requiring mealtime coverage with large meals also  RECENT history:  Insulin regimen:   Using OMNIPOD insulin pump since 3/20  Basal settings: 0.65 midnight-8: 30 AM, 8:30 AM = 0.9, 10 AM = 1.1,2 PM = 0.95.  5 PM = 1.3, 6 PM = 1.55  Recommended boluses breakfast 4-6  units, lunch and supper 6-8 units  Recently total daily insulin use under 50 units  Non-insulin hypoglycemic drugs: Metformin 1 g before breakfast  A1c is stable at 6.6  Current blood sugar patterns and problems identified: Blood sugars are excellent with recent GMI 6.6 and time in range over 90% He has more obvious dawn phenomenon in the morning  He has some variable intake in the morning and some of his sugars tend to be rising especially if he is eating bread, usually getting some protein but with 4 units blood sugars are still relatively high after breakfast Usually is trying to get good control after the other meals Currently not very active However weight has still come down Has minimal hypoglycemia as discussed in the CGM below  Blood sugar pattern interpretation from his freestyle libre version 3 download for the last 2 weeks:  Summary of patterns: Blood sugars are HIGHEST on an average in the mornings evening and lowest 3 AM Overnight blood sugars are quite variable for the first 3 hours after midnight including occasional low normal readings; however blood sugars are generally rising between 3 AM-8 AM with average 152 when he wakes up  Premeal blood sugars are only mildly  increased in the 120s before dinnertime Postprandial readings are an average very well-controlled       Side effects from medications: None             FreeStyle Libre last 2 weeks data:   CGM use % of time 96  2-week average/GV 139  Time in range        92%  Above 180 7  % Time above 250   % Time Below 70 1     PRE-MEAL Fasting Lunch Dinner Bedtime Overall  Glucose range:       Averages: 152       POST-MEAL PC Breakfast PC Lunch PC Dinner  Glucose range:     Averages: 151  144 153    Previously  CGM use % of time   2-week average/GV 136  Time in range    89    %  % Time Above 180 9  % Time above 250 1  % Time Below 70 1     PRE-MEAL Fasting Lunch Dinner Bedtime Overall  Glucose range:       Averages: 127  111     POST-MEAL PC Breakfast PC Lunch PC Dinner  Glucose range:     Averages:  146 156       Meals:2- 3 meals per day. Lunch 2-3 pm; dinner 6 pm.  For breakfast he will sometimes have English muffin and eggs otherwise only [place] bacon and egg beaters    Dietician visit: Most recent:  08/1935           Complications: are: None     Wt Readings from Last 3 Encounters:  05/29/22 175 lb 12.8 oz (79.7 kg)  03/22/22 179 lb (81.2 kg)  02/13/22 180 lb (81.6 kg)      Lab Results  Component Value Date   HGBA1C 6.6 (H) 05/22/2022   HGBA1C 6.7 (H) 01/16/2022   HGBA1C 7.0 (H) 09/20/2021   Lab Results  Component Value Date   MICROALBUR 4.8 (H) 05/22/2022   LDLCALC 47 11/08/2021   CREATININE 0.81 05/22/2022    Other active problems discussed in review of systems   Allergies as of 05/29/2022   No Known Allergies      Medication List        Accurate as of May 29, 2022 11:16 AM. If you have any questions, ask your nurse or doctor.          Comirnaty syringe Generic drug: COVID-19 mRNA vaccine 2023-2024 Inject into the muscle.   Fluad Quadrivalent 0.5 ML injection Generic drug: influenza vaccine adjuvanted Inject into the muscle.    FreeStyle Linden 2 Reader Systm Devi 1 Device by Does not apply route daily.   FreeStyle Libre 3 Sensor Misc USE TO CHECK GLUCOSE CONTINUOUSLY, CHANGE SENSOR EVERY 14 DAYS What changed: Another medication with the same name was removed. Continue taking this medication, and follow the directions you see here. Changed by: Elayne Snare, MD   FreeStyle Precision Neo Test test strip Generic drug: glucose blood USE TO CHECK BLOOD SUGAR FOR UP TO THREE TIMES DAILY   HumaLOG 100 UNIT/ML injection Generic drug: insulin lispro ADMINISTER 68 UNITS UNDER THE SKIN DAILY   losartan 50 MG tablet Commonly known as: COZAAR TAKE 1 TABLET BY MOUTH EVERY DAY   metFORMIN 1000 MG tablet Commonly known as: GLUCOPHAGE TAKE 1 TABLET(1000 MG) BY MOUTH DAILY   niacin 500 MG tablet Commonly known as: VITAMIN B3 Take 500 mg by mouth at bedtime.   Omnipod DASH PDM (Gen 4) Kit 1 each by Other route See admin instructions. Use continuously to administer insulin.   Omnipod DASH Pods (Gen 4) Misc CHANGE POD EVERY 72 AS DIRECTED   OneTouch Verio w/Device Kit Use to check blood sugar daily   pravastatin 20 MG tablet Commonly known as: PRAVACHOL TAKE 1 TABLET(20 MG) BY MOUTH DAILY        Allergies: No Known Allergies  Past Medical History:  Diagnosis Date   Diabetes mellitus ([place]) 2,000   Erectile dysfunction    HTN (hypertension)    Hyperlipidemia    Personal history of colonic adenomas 07/18/2009   Renal cancer ([place])    1992, has 1 kidney ; does not see urology any more    Past Surgical History:  Procedure Laterality Date   COLONOSCOPY     NEPHRECTOMY  1992    Family History  Problem Relation Age of Onset   Diabetes Father    Hypertension Father    CAD Father        F (MI age 45), Brother in hois 12   Lymphoma Sister    Dementia Brother        parkinsons? dementia?   Prostate cancer Brother        dx age 40   Cancer Brother        blood cancer    CAD Brother    Colon cancer  Neg Hx    Stroke Neg Hx     Social History:  reports that he has quit smoking. He has never used smokeless tobacco. He reports current alcohol use of about 2.0 standard drinks of alcohol per week. He reports that he does not use drugs.  Review of Systems -     HYPERLIPIDEMIA:   Treated with pravastatin, has had significant low HDL levels in the past  He previously had been on Slo-Niacin OTC 500 mg twice a day for low HDL but had difficulty tolerating this   HDL has been in the 20s along with small LDL particles on previous determinations  Particle number was below 1000 in 06/2014 with increased small particles   Lab Results  Component Value Date   CHOL 96 11/08/2021   HDL 26.30 (L) 11/08/2021   LDLCALC 47 11/08/2021   LDLDIRECT 45.7 05/31/2009   TRIG 117.0 11/08/2021   CHOLHDL 4 11/08/2021    He has mild hypertension, taking LOSARTAN 50 mg Previously potassium was high on lisinopril  He monitors BP at home, recently getting 378-588 + systolic  BP Readings from Last 3 Encounters:  05/29/22 (!) 148/76  03/22/22 130/68  02/13/22 (!) 140/80   Has normal potassium  Lab Results  Component Value Date   K 4.6 05/22/2022   Eye exams: Annual, last 3/23   Diabetic foot exam was done in 3/22  B12 deficiency: Screening was done as patient requested testing and his level at baseline was only 100 With starting B12 supplement his energy level was improved Currently using liquid B12 but has been irregular with this and his level is low now     Examination:   BP (!) 148/76 (BP Location: Left Arm, Patient Position: Sitting, Cuff Size: Normal)   Pulse 82   Ht '5\' 10"'$  (1.778 m)   Wt 175 lb 12.8 oz (79.7 kg)   SpO2 98%   BMI 25.22 kg/m   Body mass index is 25.22 kg/m.     Assesment/Plan:   Diabetes type 2, nonobese: See history of present illness for detailed discussion of his current blood sugar patterns, problems identified   A1c is excellent at 6.6  Continues to  be using the OmniPod 4 insulin with HUMALOG and Metformin in the morning only  His blood sugars are still very consistently controlled and 92% within target range His day-to-day variability and blood sugar patterns were discussed in detail and copies of his download given  Recommendations: Increase basal rate at 4 AM up to 1.1 at least to cover dawn phenomenon He will continue his other basal rates as before  To start going back to [facility] to exercise, will need exercise mode when he does this  Increase the boluses at both breakfast and dinnertime or snacks to cover higher carbohydrate meals Also if he is planning to be active after lunch he can reduce his bolus amounts by 2 to 3 units He will call OmniPod to get the version 5 although he wants to wait until it is available on his iPhone app  B12 deficiency: He has not taken his supplement regularly and level is low  No anemia and does not need to take iron which he is doing on his own  HYPERTENSION: Blood pressure is relatively high and with his multiple risk factors need better control, he will go up to 75 mg and losartan     There are no Patient Instructions on file for this visit.   Follow-up in 3 months   Elayne Snare 05/29/2022, 11:16 AM

## 2022-06-01 ENCOUNTER — Encounter: Payer: Self-pay | Admitting: Endocrinology

## 2022-06-04 ENCOUNTER — Encounter: Payer: Self-pay | Admitting: Family Medicine

## 2022-06-06 LAB — HM DIABETES EYE EXAM

## 2022-06-14 HISTORY — PX: HAND SURGERY: SHX662

## 2022-06-22 DIAGNOSIS — G5601 Carpal tunnel syndrome, right upper limb: Secondary | ICD-10-CM | POA: Diagnosis not present

## 2022-07-06 DIAGNOSIS — G5601 Carpal tunnel syndrome, right upper limb: Secondary | ICD-10-CM | POA: Diagnosis not present

## 2022-07-17 DIAGNOSIS — G5601 Carpal tunnel syndrome, right upper limb: Secondary | ICD-10-CM | POA: Diagnosis not present

## 2022-08-21 ENCOUNTER — Other Ambulatory Visit: Payer: Self-pay

## 2022-08-21 DIAGNOSIS — E1165 Type 2 diabetes mellitus with hyperglycemia: Secondary | ICD-10-CM

## 2022-08-21 MED ORDER — FREESTYLE LIBRE 3 SENSOR MISC: 3 refills | Status: DC

## 2022-08-27 ENCOUNTER — Encounter: Payer: Self-pay | Admitting: Internal Medicine

## 2022-08-27 ENCOUNTER — Encounter: Payer: Self-pay | Admitting: *Deleted

## 2022-08-28 ENCOUNTER — Other Ambulatory Visit: Payer: Self-pay

## 2022-08-28 DIAGNOSIS — E1165 Type 2 diabetes mellitus with hyperglycemia: Secondary | ICD-10-CM

## 2022-08-28 MED ORDER — HUMALOG 100 UNIT/ML IJ SOLN: INTRAMUSCULAR | 2 refills | Status: DC

## 2022-09-19 ENCOUNTER — Other Ambulatory Visit: Payer: Self-pay | Admitting: Endocrinology

## 2022-09-24 ENCOUNTER — Other Ambulatory Visit (INDEPENDENT_AMBULATORY_CARE_PROVIDER_SITE_OTHER): Payer: PPO

## 2022-09-24 DIAGNOSIS — E538 Deficiency of other specified B group vitamins: Secondary | ICD-10-CM

## 2022-09-24 DIAGNOSIS — E782 Mixed hyperlipidemia: Secondary | ICD-10-CM | POA: Diagnosis not present

## 2022-09-24 DIAGNOSIS — Z794 Long term (current) use of insulin: Secondary | ICD-10-CM

## 2022-09-24 DIAGNOSIS — E119 Type 2 diabetes mellitus without complications: Secondary | ICD-10-CM | POA: Diagnosis not present

## 2022-09-24 LAB — COMPREHENSIVE METABOLIC PANEL
ALT: 37 U/L (ref 0–53)
AST: 22 U/L (ref 0–37)
Albumin: 4.4 g/dL (ref 3.5–5.2)
Alkaline Phosphatase: 77 U/L (ref 39–117)
BUN: 17 mg/dL (ref 6–23)
CO2: 23 mEq/L (ref 19–32)
Calcium: 9 mg/dL (ref 8.4–10.5)
Chloride: 108 mEq/L (ref 96–112)
Creatinine, Ser: 0.82 mg/dL (ref 0.40–1.50)
GFR: 86.5 mL/min (ref >60.00)
Glucose, Bld: 109 mg/dL — ABNORMAL HIGH (ref 70–99)
Potassium: 4.5 mEq/L (ref 3.5–5.1)
Sodium: 139 mEq/L (ref 135–145)
Total Bilirubin: 0.4 mg/dL (ref 0.2–1.2)
Total Protein: 7.1 g/dL (ref 6.0–8.3)

## 2022-09-24 LAB — LIPID PANEL
Cholesterol: 90 mg/dL (ref 0–200)
HDL: 24.5 mg/dL — ABNORMAL LOW (ref >39.00)
LDL Cholesterol: 43 mg/dL (ref 0–99)
NonHDL: 65.03
Total CHOL/HDL Ratio: 4
Triglycerides: 109 mg/dL (ref 0.0–149.0)
VLDL: 21.8 mg/dL (ref 0.0–40.0)

## 2022-09-24 LAB — HEMOGLOBIN A1C: Hgb A1c MFr Bld: 6.3 % (ref 4.6–6.5)

## 2022-09-24 LAB — VITAMIN B12: Vitamin B-12: 433 pg/mL (ref 211–911)

## 2022-09-27 ENCOUNTER — Encounter: Payer: Self-pay | Admitting: Endocrinology

## 2022-09-27 ENCOUNTER — Ambulatory Visit: Payer: PPO | Admitting: Endocrinology

## 2022-09-27 VITALS — BP 128/78 | HR 68 | Resp 16 | Ht 70.0 in | Wt 171.8 lb

## 2022-09-27 DIAGNOSIS — Z794 Long term (current) use of insulin: Secondary | ICD-10-CM | POA: Diagnosis not present

## 2022-09-27 DIAGNOSIS — E1165 Type 2 diabetes mellitus with hyperglycemia: Secondary | ICD-10-CM | POA: Diagnosis not present

## 2022-09-27 DIAGNOSIS — E538 Deficiency of other specified B group vitamins: Secondary | ICD-10-CM | POA: Diagnosis not present

## 2022-09-27 DIAGNOSIS — I10 Essential (primary) hypertension: Secondary | ICD-10-CM | POA: Diagnosis not present

## 2022-09-27 MED ORDER — DEXCOM G6 TRANSMITTER MISC: 1.0000 | Freq: Once | 1 refills | Status: AC

## 2022-09-27 MED ORDER — DEXCOM G6 SENSOR MISC: 3 refills | Status: DC

## 2022-09-27 MED ORDER — OMNIPOD 5 DEXG7G6 PODS GEN 5 MISC: 1.0000 | 3 refills | Status: DC

## 2022-09-27 MED ORDER — OMNIPOD 5 DEXG7G6 INTRO GEN 5 KIT: 1.0000 | PACK | Freq: Once | 0 refills | Status: AC

## 2022-09-27 NOTE — Progress Notes (Addendum)
Patient ID: Manuel Petty, male   DOB: 1945/08/25, 77 y.o.   MRN: [id number]   Reason for Appointment: follow-up diabetes  History of Present Illness   Diagnosis: Type 2 DIABETES MELITUS, date of diagnosis: 2007   Previous history: He had been on large doses of Lantus before he was given Victoza in addition and this improved his blood sugar control and reduced his insulin requirement However he subsequently started requiring mealtime coverage with large meals also  RECENT history:  Insulin regimen:   Using OMNIPOD insulin pump since 3/20  Basal settings: 0.50 midnight-4 AM, 4 AM-8: 30 AM = 1.2,, 8:30 AM = 1.25, 10 AM = 1.50, 2 PM = 1.4.  5 PM = 1.7 Total basal 31.4  Recommended boluses breakfast 4-6  units, lunch and supper 6-8 units  Recently total daily insulin use under 50 units  Non-insulin hypoglycemic drugs: Metformin 1 g before breakfast  A1c is stable at 6.6  Current blood sugar patterns and problems identified: Blood sugars are generally fairly good with recent GMI 6.5 and time in range 90% However he has periodic high readings after meals especially midday and sometimes in the evenings This is likely from not taking his boluses at meals or late or inadequate boluses Currently he is bolusing only an average of 6 units/day, however his basal amount is increased from his last visit and he may have made some changes on his own Occasionally has only 1 bolus or none per day, some of the boluses are postprandial when the blood sugars are higher Overnight blood sugars still fairly good and fasting readings are better with basal rate adjustment previously Blood sugar pattern interpretation from his freestyle libre version 3 download for the last 2 weeks:  Analysis of his libre sensor download for the last 2 weeks from the [place] 3 data  Summary of patterns: Blood sugars are HIGHEST on an average early afternoon and lowest around 9 AM and 7 PM Overnight blood  sugars are in the low 100 range consistently but with some variability and hypoglycemia seen only once after midnight Premeal blood sugars are around 120 or below  Postprandial readings after his first meal late morning are sporadically higher especially in the early afternoon Evening blood sugars after dinner are on an average well-controlled with is at least 2 or 3 readings above target Has tendency to higher readings before bedtime Last week had 2 or 3 episodes with low sugars between 5 and 7 PM      Side effects from medications: None          Statistics:  CGM use % of time   2-week average/GV   Time in range        %  % Time Above 180   % Time above 250   % Time Below 70      PRE-MEAL Fasting Lunch Dinner Bedtime Overall  Glucose range:       Averages: 125   138    POST-MEAL PC Breakfast PC Lunch PC Dinner  Glucose range:     Averages:  151 133   Previously:  CGM use % of time 96  2-week average/GV 139  Time in range        92%  Above 180 7  % Time above 250   % Time Below 70 1     PRE-MEAL Fasting Lunch Dinner Bedtime Overall  Glucose range:       Averages: 152  POST-MEAL PC Breakfast PC Lunch PC Dinner  Glucose range:     Averages: 151  144 153    Previously  CGM use % of time   2-week average/GV 136  Time in range    89    %  % Time Above 180 9  % Time above 250 1  % Time Below 70 1     PRE-MEAL Fasting Lunch Dinner Bedtime Overall  Glucose range:       Averages: 127  111     POST-MEAL PC Breakfast PC Lunch PC Dinner  Glucose range:     Averages:  146 156       Meals:2- 3 meals per day. Lunch 2-3 pm; dinner 6 pm.  For breakfast he will sometimes have English muffin and eggs otherwise only [place] bacon and egg beaters    Dietician visit: Most recent: 10/2009           Complications: are: None     Wt Readings from Last 3 Encounters:  12/19/22 172 lb (78 kg)  10/04/22 171 lb (77.6 kg)  09/27/22 171 lb 12.8 oz (77.9 kg)      Lab  Results  Component Value Date   HGBA1C 6.2 12/13/2022   HGBA1C 6.3 09/24/2022   HGBA1C 6.6 (H) 05/22/2022   Lab Results  Component Value Date   MICROALBUR 4.8 (H) 05/22/2022   LDLCALC 42 12/13/2022   CREATININE 0.77 12/13/2022    Other active problems discussed in review of systems   Allergies as of 09/27/2022   No Known Allergies      Medication List        Accurate as of Sep 27, 2022 11:59 PM. If you have any questions, ask your nurse or doctor.          Comirnaty syringe Generic drug: COVID-19 mRNA vaccine 2023-2024 Inject into the muscle.   Dexcom G6 Transmitter Misc 1 Device by Does not apply route once for 1 dose. Started by: Reather Littler   Fluad Quadrivalent 0.5 ML injection Generic drug: influenza vaccine adjuvanted Inject into the muscle.   FreeStyle Inez 2 Reader Systm Devi 1 Device by Does not apply route daily.   FreeStyle Libre 3 Sensor Misc USE TO CHECK GLUCOSE CONTINUOUSLY, CHANGE SENSOR EVERY 14 DAYS What changed: Another medication with the same name was added. Make sure you understand how and when to take each. Changed by: Reather Littler   Dexcom G6 Sensor Misc Use to monitor blood sugar, change after 10 days What changed: You were already taking a medication with the same name, and this prescription was added. Make sure you understand how and when to take each. Changed by: Reather Littler   FreeStyle Precision Neo Test test strip Generic drug: glucose blood USE TO CHECK BLOOD SUGAR FOR UP TO THREE TIMES DAILY   HumaLOG 100 UNIT/ML injection Generic drug: insulin lispro ADMINISTER 68 UNITS UNDER THE SKIN DAILY   losartan 50 MG tablet Commonly known as: COZAAR Take 1.5 tablets (75 mg total) by mouth daily.   metFORMIN 1000 MG tablet Commonly known as: GLUCOPHAGE TAKE 1 TABLET(1000 MG) BY MOUTH DAILY   niacin 500 MG tablet Commonly known as: VITAMIN B3 Take 500 mg by mouth at bedtime.   Omnipod DASH PDM (Gen 4) Kit 1 each by Other  route See admin instructions. Use continuously to administer insulin. What changed: Another medication with the same name was added. Make sure you understand how and when to take each. Changed by:      Omnipod DASH Pods (Gen 4) Misc CHANGE POD EVERY 72 HOURS AS DIRECTED What changed: Another medication with the same name was added. Make sure you understand how and when to take each. Changed by: Reather Littler   Omnipod 5 G6 Intro (Gen 5) Kit 1 kit by Does not apply route once for 1 dose. What changed: You were already taking a medication with the same name, and this prescription was added. Make sure you understand how and when to take each. Changed by: Reather Littler   Omnipod 5 G6 Pods (Gen 5) Misc 1 Device by Does not apply route every 3 (three) days. What changed: You were already taking a medication with the same name, and this prescription was added. Make sure you understand how and when to take each. Changed by: Chelsea Primus w/Device Kit Use to check blood sugar daily   pravastatin 20 MG tablet Commonly known as: PRAVACHOL TAKE 1 TABLET(20 MG) BY MOUTH DAILY        Allergies: No Known Allergies  Past Medical History:  Diagnosis Date   Diabetes mellitus ([facility]) 2,000   Erectile dysfunction    HTN (hypertension)    Hyperlipidemia    Personal history of colonic adenomas 07/18/2009   Renal cancer ([facility])    1992, has 1 kidney ; does not see urology any more    Past Surgical History:  Procedure Laterality Date   COLONOSCOPY     HAND SURGERY Right 06/2022   NEPHRECTOMY  1992    Family History  Problem Relation Age of Onset   Diabetes Father    Hypertension Father    CAD Father        F (MI age 15), Brother in hois 47   Lymphoma Sister    Dementia Brother        parkinsons? dementia?   Prostate cancer Brother        dx age 62   Cancer Brother        blood cancer    CAD Brother    Colon cancer Neg Hx    Stroke Neg Hx     Social History:   reports that he has quit smoking. He has never used smokeless tobacco. He reports current alcohol use of about 2.0 standard drinks of alcohol per week. He reports that he does not use drugs.  Review of Systems -     HYPERLIPIDEMIA:   Treated with pravastatin, has had significant low HDL levels in the past  He previously had been on Slo-Niacin OTC 500 mg twice a day for low HDL but had difficulty tolerating this   HDL has been in the 20s along with small LDL particles on previous determinations  Particle number was below 1000 in 06/2014 with increased small particles   Lab Results  Component Value Date   CHOL 87 12/13/2022   HDL 25.20 (L) 12/13/2022   LDLCALC 42 12/13/2022   LDLDIRECT 45.7 05/31/2009   TRIG 101.0 12/13/2022   CHOLHDL 3 12/13/2022    He has mild hypertension, taking LOSARTAN 75 mg Previously potassium was high on lisinopril  He monitors BP at home, recently getting 130 systolic  BP Readings from Last 3 Encounters:  12/19/22 136/72  10/04/22 120/64  09/27/22 128/78   Has normal potassium  Lab Results  Component Value Date   K 4.7 12/13/2022   Eye exams: Annual, last 3/23   Diabetic foot exam was done in 09/2022  B12 deficiency:  Screening was done as patient requested testing and his level at baseline was only 100 With starting B12 supplement his energy level was improved Currently using liquid B12      Examination:   BP 128/78   Pulse 68   Resp 16   Ht 5\' 10"  (1.778 m)   Wt 171 lb 12.8 oz (77.9 kg)   SpO2 98%   BMI 24.65 kg/m   Body mass index is 24.65 kg/m.   Diabetic Foot Exam - Simple   Simple Foot Form Diabetic Foot exam was performed with the following findings: Yes   Visual Inspection No deformities, no ulcerations, no other skin breakdown bilaterally: Yes Sensation Testing Intact to touch and monofilament testing bilaterally: Yes Pulse Check Posterior Tibialis and Dorsalis pulse intact bilaterally: Yes Comments       Assesment/Plan:   Diabetes type 2, nonobese: See history of present illness for detailed discussion of his current blood sugar patterns, problems identified   A1c is excellent at 6.3  Continues to be using the OmniPod 4 insulin with HUMALOG and Metformin in the morning only  His blood sugars are still generally well controlled and 90 % within target range He is requiring more basal insulin since his last visit However even though he is not needing much meal coverage he is still missing some boluses or taking late boluses especially at lunch Also discussed episode of low sugars related to exercise or late boluses  Recommendations: Start consistently bolusing for all meals that have carbohydrate especially lunch Take PDM with him when he is going out to eat Continue taking correction boluses for expectedly high readings He will continue his basal rates as before Consistent exercise  Use a temporary basal of 50% when he is planning to be very active outside Also if he is planning to be right active after lunch he can reduce his bolus amounts by 2 to 3 units Will send prescription to [facility] for the OmniPod version 5 and Dexcom Discussed benefits of closed-loop system  B12 deficiency: He needs to continue his supplement regularly  HYPERTENSION: Blood pressure is better and he will continue 75 mg losartan Continue monitoring at home   LIPIDS: Lipids are well-controlled although HDL low as before  Patient Instructions  Bolus for all meals and large snacks with carbs before eating   Follow-up in 3 months     12/19/2022, 11:49 AM

## 2022-09-27 NOTE — Patient Instructions (Signed)
Bolus for all meals and large snacks with carbs before eating

## 2022-09-28 ENCOUNTER — Encounter: Payer: Self-pay | Admitting: Endocrinology

## 2022-10-04 ENCOUNTER — Ambulatory Visit: Payer: PPO | Admitting: Family Medicine

## 2022-10-04 ENCOUNTER — Encounter: Payer: Self-pay | Admitting: Family Medicine

## 2022-10-04 ENCOUNTER — Other Ambulatory Visit: Payer: Self-pay

## 2022-10-04 VITALS — BP 120/64 | Ht 70.0 in | Wt 171.0 lb

## 2022-10-04 DIAGNOSIS — G5602 Carpal tunnel syndrome, left upper limb: Secondary | ICD-10-CM

## 2022-10-04 MED ORDER — BETAMETHASONE SOD PHOS & ACET 6 (3-3) MG/ML IJ SUSP
6.0000 mg | Freq: Once | INTRAMUSCULAR | Status: AC
Start: 2022-10-04 — End: 2022-10-04
  Administered 2022-10-04: 6 mg via INTRA_ARTICULAR

## 2022-10-04 NOTE — Assessment & Plan Note (Signed)
Acute on chronic in nature.  Is awaiting to have released later this year.  Has done well with the right hand carpal tunnel release. -Counseled on home exercise therapy and supportive care. -Injection today.

## 2022-10-04 NOTE — Addendum Note (Signed)
Addended by: Merrilyn Puma on: 10/04/2022 03:37 PM   Modules accepted: Orders

## 2022-10-04 NOTE — Progress Notes (Signed)
  Manuel Petty - 77 y.o. male MRN [id number]  Date of birth: 11-Mar-1946  SUBJECTIVE:  Including CC & ROS.  No chief complaint on file.   Manuel Petty is a 77 y.o. male that is presenting with acute pain of his left carpal tunnel syndrome.  He has been doing well since his right carpal tunnel was released.  Having numbness and pain that is worse in the morning in the palmar aspect of the left hand.    Review of Systems See HPI   HISTORY: Past Medical, Surgical, Social, and Family History Reviewed & Updated per EMR.   Pertinent Historical Findings include:  Past Medical History:  Diagnosis Date   Diabetes mellitus ([facility]) 2,000   Erectile dysfunction    HTN (hypertension)    Hyperlipidemia    Personal history of colonic adenomas 07/18/2009   Renal cancer ([facility])    1992, has 1 kidney ; does not see urology any more    Past Surgical History:  Procedure Laterality Date   COLONOSCOPY     NEPHRECTOMY  1992     PHYSICAL EXAM:  VS: BP 120/64 (BP Location: Left Arm, Patient Position: Sitting)   Ht 5\' 10"  (1.778 m)   Wt 171 lb (77.6 kg)   BMI 24.54 kg/m  Physical Exam Gen: NAD, alert, cooperative with exam, well-appearing MSK:  Neurovascularly intact    Limited ultrasound: Left hand pain:  Flattening of the median nerve within the carpal tunnel and enlargement of the circumference  Summary: Findings consistent with carpal tunnel syndrome  Ultrasound and interpretation by Clare Gandy, MD  Aspiration/Injection Procedure Note Manuel Petty 11-11-1945  Procedure: Injection Indications: Left carpal tunnel syndrome  Procedure Details Consent: Risks of procedure as well as the alternatives and risks of each were explained to the (patient/caregiver).  Consent for procedure obtained. Time Out: Verified patient identification, verified procedure, site/side was marked, verified correct patient position, special equipment/implants available,  medications/allergies/relevent history reviewed, required imaging and test results available.  Performed.  The area was cleaned with iodine and alcohol swabs.    The left median nerve in the carpal tunnel was injected using 1 2 cc of D5W, 2 cc of normal saline, 1 cc's of 6 mg of betamethasone and 2 cc's of 0.25% bupivacaine was injected.  Ultrasound was used. Images were obtained in long views showing the injection.     A sterile dressing was applied.  Patient did tolerate procedure well.     ASSESSMENT & PLAN:   Carpal tunnel syndrome, left Acute on chronic in nature.  Is awaiting to have released later this year.  Has done well with the right hand carpal tunnel release. -Counseled on home exercise therapy and supportive care. -Injection today.

## 2022-10-04 NOTE — Patient Instructions (Signed)
Good to see you Please try the hand exercises on the right   Please send me a message in MyChart with any questions or updates.  Please see the clinic back as needed.   --Dr. Jordan Likes

## 2022-10-23 ENCOUNTER — Other Ambulatory Visit: Payer: Self-pay

## 2022-10-23 DIAGNOSIS — E1165 Type 2 diabetes mellitus with hyperglycemia: Secondary | ICD-10-CM

## 2022-10-23 MED ORDER — METFORMIN HCL 1000 MG PO TABS
ORAL_TABLET | ORAL | 1 refills | Status: DC
Start: 2022-10-23 — End: 2023-04-23

## 2022-10-23 MED ORDER — PRAVASTATIN SODIUM 20 MG PO TABS
ORAL_TABLET | ORAL | 1 refills | Status: DC
Start: 2022-10-23 — End: 2023-04-23

## 2022-11-19 ENCOUNTER — Other Ambulatory Visit: Payer: Self-pay | Admitting: Endocrinology

## 2022-11-19 ENCOUNTER — Encounter: Payer: Self-pay | Admitting: Endocrinology

## 2022-11-19 DIAGNOSIS — E1165 Type 2 diabetes mellitus with hyperglycemia: Secondary | ICD-10-CM

## 2022-11-19 DIAGNOSIS — Z794 Long term (current) use of insulin: Secondary | ICD-10-CM

## 2022-11-20 MED ORDER — DEXCOM G6 SENSOR MISC
3 refills | Status: DC
Start: 2022-11-20 — End: 2022-11-29

## 2022-11-20 MED ORDER — OMNIPOD 5 DEXG7G6 PODS GEN 5 MISC
1.0000 | 3 refills | Status: DC
Start: 2022-11-20 — End: 2023-09-11

## 2022-11-22 ENCOUNTER — Telehealth: Payer: Self-pay

## 2022-11-22 DIAGNOSIS — E1165 Type 2 diabetes mellitus with hyperglycemia: Secondary | ICD-10-CM

## 2022-11-22 MED ORDER — DEXCOM G7 RECEIVER DEVI
1.0000 | 0 refills | Status: DC
Start: 2022-11-22 — End: 2022-12-03

## 2022-11-22 NOTE — Telephone Encounter (Signed)
Manuel Petty Ann [place] Arch, CMA  ?

## 2022-11-23 ENCOUNTER — Telehealth: Payer: Self-pay

## 2022-11-23 NOTE — Telephone Encounter (Signed)
Pharmacy stated she had some questions regarding the compatibility of the Dexcom and Omnipod for the above patient.   A call was requested. Could one of you please help with this issue?

## 2022-11-27 ENCOUNTER — Telehealth: Payer: Self-pay | Admitting: Dietician

## 2022-11-27 NOTE — Telephone Encounter (Signed)
Returned patient call.  Left message on voice mail for him to return our call if he still has questions. Oran Rein, RD, LDN, CDCES

## 2022-11-29 ENCOUNTER — Other Ambulatory Visit: Payer: Self-pay

## 2022-11-29 DIAGNOSIS — E119 Type 2 diabetes mellitus without complications: Secondary | ICD-10-CM

## 2022-11-29 DIAGNOSIS — E1165 Type 2 diabetes mellitus with hyperglycemia: Secondary | ICD-10-CM

## 2022-11-29 MED ORDER — DEXCOM G6 SENSOR MISC
3 refills | Status: DC
Start: 2022-11-29 — End: 2022-12-19

## 2022-12-03 ENCOUNTER — Other Ambulatory Visit: Payer: Self-pay

## 2022-12-03 ENCOUNTER — Other Ambulatory Visit (HOSPITAL_COMMUNITY): Payer: Self-pay

## 2022-12-03 ENCOUNTER — Telehealth: Payer: Self-pay

## 2022-12-03 DIAGNOSIS — E1165 Type 2 diabetes mellitus with hyperglycemia: Secondary | ICD-10-CM

## 2022-12-03 MED ORDER — DEXCOM G7 RECEIVER DEVI
1.0000 | 0 refills | Status: DC
Start: 2022-12-03 — End: 2022-12-19

## 2022-12-11 NOTE — Telephone Encounter (Signed)
No additional info °

## 2022-12-13 ENCOUNTER — Other Ambulatory Visit: Payer: Self-pay | Admitting: Endocrinology

## 2022-12-13 ENCOUNTER — Other Ambulatory Visit: Payer: Self-pay

## 2022-12-13 ENCOUNTER — Other Ambulatory Visit (INDEPENDENT_AMBULATORY_CARE_PROVIDER_SITE_OTHER): Payer: PPO

## 2022-12-13 DIAGNOSIS — Z794 Long term (current) use of insulin: Secondary | ICD-10-CM

## 2022-12-13 DIAGNOSIS — E538 Deficiency of other specified B group vitamins: Secondary | ICD-10-CM | POA: Diagnosis not present

## 2022-12-13 DIAGNOSIS — E1169 Type 2 diabetes mellitus with other specified complication: Secondary | ICD-10-CM | POA: Diagnosis not present

## 2022-12-13 DIAGNOSIS — E785 Hyperlipidemia, unspecified: Secondary | ICD-10-CM

## 2022-12-13 DIAGNOSIS — E1165 Type 2 diabetes mellitus with hyperglycemia: Secondary | ICD-10-CM

## 2022-12-13 LAB — BASIC METABOLIC PANEL
BUN: 17 mg/dL (ref 6–23)
CO2: 23 mEq/L (ref 19–32)
Calcium: 8.7 mg/dL (ref 8.4–10.5)
Chloride: 109 mEq/L (ref 96–112)
Creatinine, Ser: 0.77 mg/dL (ref 0.40–1.50)
GFR: 88.02 mL/min (ref >60.00)
Glucose, Bld: 140 mg/dL — ABNORMAL HIGH (ref 70–99)
Potassium: 4.7 mEq/L (ref 3.5–5.1)
Sodium: 138 mEq/L (ref 135–145)

## 2022-12-13 LAB — VITAMIN B12: Vitamin B-12: 274 pg/mL (ref 211–911)

## 2022-12-13 LAB — LIPID PANEL
Cholesterol: 87 mg/dL (ref 0–200)
HDL: 25.2 mg/dL — ABNORMAL LOW (ref >39.00)
LDL Cholesterol: 42 mg/dL (ref 0–99)
NonHDL: 61.77
Total CHOL/HDL Ratio: 3
Triglycerides: 101 mg/dL (ref 0.0–149.0)
VLDL: 20.2 mg/dL (ref 0.0–40.0)

## 2022-12-13 LAB — HEMOGLOBIN A1C: Hgb A1c MFr Bld: 6.2 % (ref 4.6–6.5)

## 2022-12-13 NOTE — Addendum Note (Signed)
Addended by: Maximino Sarin on: 12/13/2022 11:01 AM   Modules accepted: Orders

## 2022-12-19 ENCOUNTER — Ambulatory Visit: Payer: PPO | Admitting: Endocrinology

## 2022-12-19 ENCOUNTER — Encounter: Payer: Self-pay | Admitting: Endocrinology

## 2022-12-19 VITALS — BP 136/72 | HR 68 | Ht 70.0 in | Wt 172.0 lb

## 2022-12-19 DIAGNOSIS — Z794 Long term (current) use of insulin: Secondary | ICD-10-CM | POA: Diagnosis not present

## 2022-12-19 DIAGNOSIS — I10 Essential (primary) hypertension: Secondary | ICD-10-CM

## 2022-12-19 DIAGNOSIS — E119 Type 2 diabetes mellitus without complications: Secondary | ICD-10-CM | POA: Diagnosis not present

## 2022-12-19 DIAGNOSIS — E782 Mixed hyperlipidemia: Secondary | ICD-10-CM | POA: Diagnosis not present

## 2022-12-19 NOTE — Progress Notes (Signed)
Patient ID: Manuel Petty, male   DOB: 1946-02-07, 77 y.o.   MRN: [id number]   Reason for Appointment: follow-up diabetes  History of Present Illness   Diagnosis: Type 2 DIABETES MELITUS, date of diagnosis: 2007   Previous history: He had been on large doses of Lantus before he was given Victoza in addition and this improved his blood sugar control and reduced his insulin requirement However he subsequently started requiring mealtime coverage with large meals also  RECENT history:  Insulin regimen:   Using OMNIPOD insulin pump since 3/20  Basal settings: 0.50 midnight-4 AM, 4 AM-8: 30 AM = 1.2,, 8:30 AM = 1.25, 10 AM = 1.50, 2 PM = 1.4.  5 PM = 1.7 Total basal 31.4  Recommended boluses breakfast 4-6  units, lunch and supper 6-8 units  Recently total daily insulin use under 50 units  Non-insulin hypoglycemic drugs: Metformin 1 g before breakfast  A1c is 6.2, previously was at 6.6  Current blood sugar patterns and problems identified: Blood sugars are very well-controlled with recent GMI 6.4 and time in range 92 % As before he has periodic high readings now more significantly in the early mornings, midday and rarely after dinner He is sometimes eating snack with milk early in the morning without taking a bolus Also likely not bolusing much with breakfast and lunch Appears to be taking mostly correction boluses on some days when the blood sugar seems to be high However needs less bolus coverage in the evening probably because of decreased carbohydrate intake  and highest bolus recently appears to be 5.5 units at dinner Overnight blood sugars are generally fairly good except slightly higher early morning Although he has been advised to use temporary basal for exercise he forgets to do this and will periodically have low sugars in the late afternoon and several low normal readings also Only when he is doing more significant exercise like mowing the lawn he will  suspend the pump He was recommended the OmniPod 5 pump on the last visit but he is not wanting to do this and not clear why the prescription for Dexcom sensor was also not filled by pharmacy   Blood sugar pattern interpretation from his freestyle libre version 3 download for the last 2 weeks:   Summary of patterns: Blood sugars are HIGHEST on an average midday and lowest late at night along with periodic low sugars late afternoon or early evening Overnight blood sugars are somewhat variable but mostly averaging in the 120s and 130s and rising around 6 AM.  No hypoglycemia  Premeal blood sugars are around 120 at lunch and 115 at dinnertime with some variability  Postprandial readings readings on average and not rising significantly at any time However will have sporadic spikes above 180 hours or right after 12 noon His first meal late morning are sporadically higher especially in the early afternoon       Side effects from medications: None          Statistics:  CGM use % of time 92  2-week average/GV 130  Time in range       92%  % Time Above 180 7  % Time above 250   % Time Below 70 1     PRE-MEAL Fasting Lunch Dinner Bedtime Overall  Glucose range:       Mean/median: 136       POST-MEAL PC Breakfast PC Lunch PC Dinner  Glucose range:     Mean/median: 147  141  131     Previously:  CGM use % of time 96  2-week average/GV 139  Time in range        92%  Above 180 7  % Time above 250   % Time Below 70 1     PRE-MEAL Fasting Lunch Dinner Bedtime Overall  Glucose range:       Averages: 152       POST-MEAL PC Breakfast PC Lunch PC Dinner  Glucose range:     Averages: 151  144 153    Previously  CGM use % of time   2-week average/GV 136  Time in range    89    %  % Time Above 180 9  % Time above 250 1  % Time Below 70 1     PRE-MEAL Fasting Lunch Dinner Bedtime Overall  Glucose range:       Averages: 127  111     POST-MEAL PC Breakfast PC Lunch PC  Dinner  Glucose range:     Averages:  146 156       Meals:2- 3 meals per day. Lunch 2-3 pm; dinner 6 pm.  For breakfast he will sometimes have English muffin and eggs otherwise only [place] bacon and egg beaters    Dietician visit: Most recent: 10/2009           Complications: are: None     Wt Readings from Last 3 Encounters:  12/19/22 172 lb (78 kg)  10/04/22 171 lb (77.6 kg)  09/27/22 171 lb 12.8 oz (77.9 kg)      Lab Results  Component Value Date   HGBA1C 6.2 12/13/2022   HGBA1C 6.3 09/24/2022   HGBA1C 6.6 (H) 05/22/2022   Lab Results  Component Value Date   MICROALBUR 4.8 (H) 05/22/2022   LDLCALC 42 12/13/2022   CREATININE 0.77 12/13/2022    Other active problems discussed in review of systems   Allergies as of 12/19/2022   No Known Allergies      Medication List        Accurate as of December 19, 2022 11:06 AM. If you have any questions, ask your nurse or doctor.          STOP taking these medications    Comirnaty syringe Generic drug: COVID-19 mRNA vaccine 2023-2024 Stopped by: Reather Littler   Fluad Quadrivalent 0.5 ML injection Generic drug: influenza vaccine adjuvanted Stopped by: Reather Littler       TAKE these medications    FreeStyle Libre 2 Reader Systm Devi 1 Device by Does not apply route daily. What changed: Another medication with the same name was removed. Continue taking this medication, and follow the directions you see here. Changed by: Reather Littler   FreeStyle Libre 3 Sensor Misc APPLY SENSOR TO SKIN FOR CONTINUOUS BLOOD SUGAR MONITORING, REPLACE EVERY 14 DAYS What changed: Another medication with the same name was removed. Continue taking this medication, and follow the directions you see here. Changed by: Reather Littler   FreeStyle Precision Neo Test test strip Generic drug: glucose blood USE TO CHECK BLOOD SUGAR FOR UP TO THREE TIMES DAILY   HumaLOG 100 UNIT/ML injection Generic drug: insulin lispro INJECT UNDER THE SKIN 68 UNITS  DAILY   losartan 50 MG tablet Commonly known as: COZAAR Take 1.5 tablets (75 mg total) by mouth daily.   metFORMIN 1000 MG tablet Commonly known as: GLUCOPHAGE TAKE 1 TABLET(1000 MG) BY MOUTH DAILY   niacin 500 MG tablet Commonly known  as: VITAMIN B3 Take 500 mg by mouth at bedtime.   Omnipod DASH PDM (Gen 4) Kit 1 each by Other route See admin instructions. Use continuously to administer insulin.   Omnipod DASH Pods (Gen 4) Misc CHANGE POD EVERY 72 HOURS AS DIRECTED   Omnipod 5 G6 Pods (Gen 5) Misc 1 Device by Does not apply route every 3 (three) days.   OneTouch Verio w/Device Kit Use to check blood sugar daily   pravastatin 20 MG tablet Commonly known as: PRAVACHOL TAKE 1 TABLET(20 MG) BY MOUTH DAILY        Allergies: No Known Allergies  Past Medical History:  Diagnosis Date   Diabetes mellitus (HCC) 2,000   Erectile dysfunction    HTN (hypertension)    Hyperlipidemia    Personal history of colonic adenomas 07/18/2009   Renal cancer (HCC)    1992, has 1 kidney ; does not see urology any more    Past Surgical History:  Procedure Laterality Date   COLONOSCOPY     HAND SURGERY Right 06/2022   NEPHRECTOMY  1992    Family History  Problem Relation Age of Onset   Diabetes Father    Hypertension Father    CAD Father        F (MI age 44), Brother in hois 7   Lymphoma Sister    Dementia Brother        parkinsons? dementia?   Prostate cancer Brother        dx age 74   Cancer Brother        blood cancer    CAD Brother    Colon cancer Neg Hx    Stroke Neg Hx     Social History:  reports that he has quit smoking. He has never used smokeless tobacco. He reports current alcohol use of about 2.0 standard drinks of alcohol per week. He reports that he does not use drugs.  Review of Systems -     HYPERLIPIDEMIA:   Treated with pravastatin, has had significant low HDL levels in the past  He previously had been on Slo-Niacin OTC 500 mg twice a day for  low HDL but had difficulty tolerating this   HDL has been in the 20s along with small LDL particles on previous determinations  Particle number was below 1000 in 06/2014 with increased small particles   Lab Results  Component Value Date   CHOL 87 12/13/2022   HDL 25.20 (L) 12/13/2022   LDLCALC 42 12/13/2022   LDLDIRECT 45.7 05/31/2009   TRIG 101.0 12/13/2022   CHOLHDL 3 12/13/2022    He has mild hypertension, taking LOSARTAN 75 mg Previously potassium was high on lisinopril  He monitors BP at home with good readings  BP Readings from Last 3 Encounters:  12/19/22 136/72  10/04/22 120/64  09/27/22 128/78   Has normal potassium  Lab Results  Component Value Date   K 4.7 12/13/2022   Eye exams: Annual, last 3/23   Diabetic foot exam was done in 09/2022  B12 deficiency: Screening was done as patient requested testing and his level at baseline was only 100 With starting B12 supplement his energy level was improved Currently using liquid B12  B12 levels normal subsequently    Examination:   BP 136/72   Pulse 68   Ht 5\' 10"  (1.778 m)   Wt 172 lb (78 kg)   SpO2 97%   BMI 24.68 kg/m   Body mass index is 24.68 kg/m.  No ankle edema   Assesment/Plan:   Diabetes type 2, nonobese: See history of present illness for detailed discussion of his current blood sugar patterns, problems identified   A1c is excellent at 6.2  He is using the OmniPod 4 insulin with HUMALOG and Metformin in the morning only  His blood sugars are very well-controlled with recent GMI 6.4 However he is not consistently having postprandial control of his blood sugars from with boluses However despite being insulin-dependent he does not consistently have high postprandial readings especially at dinnertime Highest average blood sugar at any given time is 147 on his sensor  Likely blood sugars are better in the evenings because of being more active especially in the late afternoon when he also  has occasional hypoglycemia  Recommendations for  Again reminded him to start bolusing with every meal that has carbohydrates especially breakfast and lunch or early morning snacks He will need to increase his boluses for some breakfast meals that are raising his blood sugars excessively Since he does not want to switch to the OmniPod 5 pump we will continue current system including the libre 3 sensor which he prefers He needs to start consistently using the temporary basal when he is more active as he appears to be forgetting to do this May need to consider reducing basal rate late afternoon if blood sugars are more consistently lower  B12 deficiency: Adequately replaced and he needs to continue his supplement long-term  HYPERTENSION: Blood pressure is consistently controlled and he will continue 75 mg losartan Continue monitoring at home also   LIPIDS: Lipids are well-controlled; HDL low as before  There are no Patient Instructions on file for this visit.   Follow-up in 3 months     12/19/2022, 11:06 AM

## 2022-12-21 ENCOUNTER — Encounter: Payer: Self-pay | Admitting: Endocrinology

## 2023-01-24 ENCOUNTER — Encounter: Payer: Self-pay | Admitting: Endocrinology

## 2023-01-24 DIAGNOSIS — Z125 Encounter for screening for malignant neoplasm of prostate: Secondary | ICD-10-CM

## 2023-01-24 DIAGNOSIS — E538 Deficiency of other specified B group vitamins: Secondary | ICD-10-CM

## 2023-02-06 NOTE — Telephone Encounter (Signed)
I have placed orders

## 2023-02-07 ENCOUNTER — Other Ambulatory Visit (HOSPITAL_BASED_OUTPATIENT_CLINIC_OR_DEPARTMENT_OTHER): Payer: Self-pay

## 2023-02-07 MED ORDER — INFLUENZA VAC A&B SURF ANT ADJ 0.5 ML IM SUSY
0.5000 mL | PREFILLED_SYRINGE | Freq: Once | INTRAMUSCULAR | 0 refills | Status: AC
  Filled 2023-02-07: qty 0.5, 1d supply, fill #0

## 2023-02-07 MED ORDER — COVID-19 MRNA VAC-TRIS(PFIZER) 30 MCG/0.3ML IM SUSY
0.3000 mL | PREFILLED_SYRINGE | Freq: Once | INTRAMUSCULAR | 0 refills | Status: AC
Start: 2023-02-07 — End: 2023-02-08
  Filled 2023-02-07: qty 0.3, 1d supply, fill #0

## 2023-03-26 DIAGNOSIS — G5602 Carpal tunnel syndrome, left upper limb: Secondary | ICD-10-CM | POA: Diagnosis not present

## 2023-04-17 ENCOUNTER — Other Ambulatory Visit: Payer: Self-pay

## 2023-04-17 ENCOUNTER — Other Ambulatory Visit (INDEPENDENT_AMBULATORY_CARE_PROVIDER_SITE_OTHER): Payer: PPO

## 2023-04-17 DIAGNOSIS — E538 Deficiency of other specified B group vitamins: Secondary | ICD-10-CM | POA: Diagnosis not present

## 2023-04-17 DIAGNOSIS — Z794 Long term (current) use of insulin: Secondary | ICD-10-CM

## 2023-04-17 DIAGNOSIS — Z125 Encounter for screening for malignant neoplasm of prostate: Secondary | ICD-10-CM

## 2023-04-17 DIAGNOSIS — E1165 Type 2 diabetes mellitus with hyperglycemia: Secondary | ICD-10-CM

## 2023-04-17 LAB — VITAMIN B12: Vitamin B-12: 272 pg/mL (ref 211–911)

## 2023-04-17 LAB — BASIC METABOLIC PANEL
BUN: 25 mg/dL — ABNORMAL HIGH (ref 6–23)
CO2: 24 meq/L (ref 19–32)
Calcium: 8.9 mg/dL (ref 8.4–10.5)
Chloride: 107 meq/L (ref 96–112)
Creatinine, Ser: 0.79 mg/dL (ref 0.40–1.50)
GFR: 87.13 mL/min (ref >60.00)
Glucose, Bld: 183 mg/dL — ABNORMAL HIGH (ref 70–99)
Potassium: 5.2 meq/L — ABNORMAL HIGH (ref 3.5–5.1)
Sodium: 138 meq/L (ref 135–145)

## 2023-04-17 LAB — PSA: PSA: 0.51 ng/mL (ref 0.10–4.00)

## 2023-04-17 LAB — HEMOGLOBIN A1C: Hgb A1c MFr Bld: 6.3 % (ref 4.6–6.5)

## 2023-04-19 DIAGNOSIS — G5602 Carpal tunnel syndrome, left upper limb: Secondary | ICD-10-CM | POA: Diagnosis not present

## 2023-04-19 DIAGNOSIS — Z4789 Encounter for other orthopedic aftercare: Secondary | ICD-10-CM | POA: Diagnosis not present

## 2023-04-23 ENCOUNTER — Other Ambulatory Visit: Payer: Self-pay

## 2023-04-23 DIAGNOSIS — E1165 Type 2 diabetes mellitus with hyperglycemia: Secondary | ICD-10-CM

## 2023-04-23 MED ORDER — METFORMIN HCL 1000 MG PO TABS: ORAL_TABLET | ORAL | 1 refills | Status: DC

## 2023-04-23 MED ORDER — HUMALOG 100 UNIT/ML IJ SOLN: INTRAMUSCULAR | 2 refills | Status: DC

## 2023-04-23 MED ORDER — PRAVASTATIN SODIUM 20 MG PO TABS: ORAL_TABLET | ORAL | 1 refills | Status: DC

## 2023-04-24 ENCOUNTER — Ambulatory Visit: Payer: PPO | Admitting: Endocrinology

## 2023-05-03 ENCOUNTER — Encounter: Payer: Self-pay | Admitting: Endocrinology

## 2023-05-03 ENCOUNTER — Encounter: Payer: Self-pay | Admitting: Internal Medicine

## 2023-05-03 ENCOUNTER — Ambulatory Visit: Payer: PPO | Admitting: Endocrinology

## 2023-05-03 VITALS — BP 138/80 | HR 76 | Resp 20 | Ht 70.0 in | Wt 168.2 lb

## 2023-05-03 DIAGNOSIS — E1165 Type 2 diabetes mellitus with hyperglycemia: Secondary | ICD-10-CM | POA: Diagnosis not present

## 2023-05-03 DIAGNOSIS — Z794 Long term (current) use of insulin: Secondary | ICD-10-CM | POA: Diagnosis not present

## 2023-05-03 NOTE — Progress Notes (Signed)
Outpatient Endocrinology Note [place] Aser Nylund, MD   Patient's Name: Manuel Petty    DOB: Dec 04, 1945    MRN: [id number]                                                    REASON OF VISIT: Follow up for type 2 diabetes mellitus  PCP: Wanda Plump, MD  HISTORY OF PRESENT ILLNESS:   Manuel Petty is a 77 y.o. old male with past medical history listed below, is here for follow up for type 2 diabetes mellitus.   Pertinent Diabetes History: Patient was previously seen by Dr. Lucianne Muss and was last time seen in August 2024.  Patient was diagnosed with type 2 diabetes mellitus in 2007.  Patient has been lately on insulin pump therapy, started in March 2020.  Chronic Diabetes Complications : Retinopathy: no. Last ophthalmology exam was done on 05/2022, following with ophthalmology regularly.  Nephropathy: no, on ACE/ARB /losartan.  He has solitary kidney.  Status post nephrectomy for renal cancer in 1992. Peripheral neuropathy: no Coronary artery disease: no Stroke: no  Relevant comorbidities and cardiovascular risk factors: Obesity: no Body mass index is 24.13 kg/m.  Hypertension: Yes  Hyperlipidemia : Yes, on statin.   Current / Home Diabetic regimen includes:  Metformin 1000 mg daily.  OmniPod Dash, using Humalog U100.  He has been on freestyle libre 3 CGM.  Insulin Pump setting:  Basal MN- 0.25u/hour 4AM- 0.75  8:30AM-1.25 10AM- 1.5 2PM-    1.0 6PM-    1.7  Bolus CHO Ratio (1unit:CHO) MN- 1:10  Correction/Sensitivity: MN- 1:60 6AM- 1:50 10PM- 1:60  Target: 120  Active insulin time: 4 hours  Prior diabetic medications:  CONTINUOUS GLUCOSE MONITORING SYSTEM (CGMS) / INSULIN PUMP INTERPRETATION:                         OmniPod Dash & Sensor Download (Reviewed and summarized below.)  Dates: December 7 to May 03, 2023, 14 days   Average daily carbs entered: 59 g Average total daily insulin:  37 units, Basal: 71%, Bolus: 29%  He was recommended for  OmniPod 5 previously however he does not want to be on Dexcom CGM and preferred to stay on OmniPod Dash.    Glycemic data:    CONTINUOUS GLUCOSE MONITORING SYSTEM (CGMS) INTERPRETATION: At today's visit, we reviewed CGM downloads. The full report is scanned in the media. Reviewing the CGM trends, blood glucose are as follows:  FreeStyle Libre 3+ CGM-  Sensor Download (Sensor download was reviewed and summarized below.) Dates: December 7 to May 03, 2023, 2 days   Glucose Management Indicator: 6.8%    Interpretation: -Mostly acceptable blood sugar, target blood sugar 70-180 is 81%.  Blood sugar overnight acceptable.  Occasional mild hyperglycemia with blood sugar up to 180s range related with timing of meal bolus and amount of meal bolus.  Rare hypoglycemia in between the meals with blood sugar in high 50s and 60s range.  No concerning hypoglycemia.  He has been bolusing for 4-6 times a day, generally with carb count and some time for correctional boluses.  Hypoglycemia: Patient has minor hypoglycemic episodes. Patient has hypoglycemia awareness.    Factors modifying glucose control: 1.  Diabetic diet assessment: 2-3 meals a day.  2.  Staying active or exercising:  3.  Medication compliance: compliant all of the time.  # B12 deficiency: Adequately replaced and he needs to continue his supplement long-term.  Interval history  Pump data and CGM data as reviewed above.  Denies numbness and tingling of the feet.  No vision problem.  No other complaints today.  Recent lab results reviewed.  Hemoglobin A1c 6.3%.  Recently he has BUN and mildly elevated serum potassium.  REVIEW OF SYSTEMS As per history of present illness.   PAST MEDICAL HISTORY: Past Medical History:  Diagnosis Date   Diabetes mellitus (HCC) 2,000   Erectile dysfunction    HTN (hypertension)    Hyperlipidemia    Personal history of colonic adenomas 07/18/2009   Renal cancer (HCC)    1992, has 1 kidney ; does  not see urology any more    PAST SURGICAL HISTORY: Past Surgical History:  Procedure Laterality Date   COLONOSCOPY     HAND SURGERY Right 06/2022   NEPHRECTOMY  1992    ALLERGIES: No Known Allergies  FAMILY HISTORY:  Family History  Problem Relation Age of Onset   Diabetes Father    Hypertension Father    CAD Father        F (MI age 41), Brother in ho[place] 11   Lymphoma Sister    Dementia Brother        parkinsons? dementia?   Prostate cancer Brother        dx age 16   Cancer Brother        blood cancer    CAD Brother    Colon cancer Neg Hx    Stroke Neg Hx     SOCIAL HISTORY: Social History   Socioeconomic History   Marital status: Married    Spouse name: Not on file   Number of children: 1   Years of education: Not on file   Highest education level: Not on file  Occupational History   Occupation: retired   Tobacco Use   Smoking status: Former   Smokeless tobacco: Never   Tobacco comments:    quit in 1992, 2 ppd  Substance and Sexual Activity   Alcohol use: Yes    Alcohol/week: 2.0 standard drinks of alcohol    Types: 2 Cans of beer per week   Drug use: No    Comment: used to use marihuana     Sexual activity: Not on file  Other Topics Concern   Not on file  Social History Narrative   Lives w/ wife   Social Drivers of Corporate investment banker Strain: Not on file  Food Insecurity: Not on file  Transportation Needs: Not on file  Physical Activity: Not on file  Stress: Not on file  Social Connections: Not on file    MEDICATIONS:  Current Outpatient Medications  Medication Sig Dispense Refill   Blood Glucose Monitoring Suppl (ONETOUCH VERIO) w/Device KIT Use to check blood sugar daily 1 kit 0   Continuous Blood Gluc Receiver (FREESTYLE LIBRE 2 READER SYSTM) DEVI 1 Device by Does not apply route daily. 1 each 1   Continuous Glucose Sensor (FREESTYLE LIBRE 3 SENSOR) MISC APPLY SENSOR TO SKIN FOR CONTINUOUS BLOOD SUGAR MONITORING, REPLACE EVERY 14  DAYS 2 each 3   FREESTYLE PRECISION NEO TEST test strip USE TO CHECK BLOOD SUGAR FOR UP TO THREE TIMES DAILY 200 strip 1   HUMALOG 100 UNIT/ML injection INJECT UNDER THE SKIN 68 UNITS DAILY 20 mL 2   Insulin Disposable Pump (OMNIPOD 5  G6 PODS, GEN 5,) MISC 1 Device by Does not apply route every 3 (three) days. 2 each 3   Insulin Disposable Pump (OMNIPOD DASH PODS, GEN 4,) MISC CHANGE POD EVERY 72 HOURS AS DIRECTED 30 each 6   Insulin Disposable Pump (OMNIPOD DASH SYSTEM) KIT 1 each by Other route See admin instructions. Use continuously to administer insulin.     losartan (COZAAR) 50 MG tablet Take 1.5 tablets (75 mg total) by mouth daily. 135 tablet 3   metFORMIN (GLUCOPHAGE) 1000 MG tablet TAKE 1 TABLET(1000 MG) BY MOUTH DAILY 90 tablet 1   niacin 500 MG tablet Take 500 mg by mouth at bedtime.     pravastatin (PRAVACHOL) 20 MG tablet TAKE 1 TABLET(20 MG) BY MOUTH DAILY 90 tablet 1   Current Facility-Administered Medications  Medication Dose Route Frequency Provider Last Rate Last Admin   methylPREDNISolone acetate (DEPO-MEDROL) injection 40 mg  40 mg Intra-articular Once Myra Rude, MD        PHYSICAL EXAM: Vitals:   05/03/23 0928  BP: 138/80  Pulse: 76  Resp: 20  SpO2: 96%  Weight: 168 lb 3.2 oz (76.3 kg)  Height: 5\' 10"  (1.778 m)   Body mass index is 24.13 kg/m.  Wt Readings from Last 3 Encounters:  05/03/23 168 lb 3.2 oz (76.3 kg)  12/19/22 172 lb (78 kg)  10/04/22 171 lb (77.6 kg)    General: Well developed, well nourished male in no apparent distress.  HEENT: AT/[place], no external lesions.  Eyes: Conjunctiva clear and no icterus. Neck: Neck supple  Lungs: Respirations not labored Neurologic: Alert, oriented, normal speech Extremities / Skin: Dry. No sores or rashes noted. No acanthosis nigricans Psychiatric: Does not appear depressed or anxious  Diabetic Foot Exam - Simple   No data filed    LABS Reviewed Lab Results  Component Value Date   HGBA1C 6.3  04/17/2023   HGBA1C 6.2 12/13/2022   HGBA1C 6.3 09/24/2022   Lab Results  Component Value Date   FRUCTOSAMINE 261 11/12/2017   Lab Results  Component Value Date   CHOL 87 12/13/2022   HDL 25.20 (L) 12/13/2022   LDLCALC 42 12/13/2022   LDLDIRECT 45.7 05/31/2009   TRIG 101.0 12/13/2022   CHOLHDL 3 12/13/2022   Lab Results  Component Value Date   MICRALBCREAT 8.6 05/22/2022   MICRALBCREAT 13.8 03/15/2021   Lab Results  Component Value Date   CREATININE 0.79 04/17/2023   Lab Results  Component Value Date   GFR 87.13 04/17/2023    ASSESSMENT / PLAN  1. Type 2 diabetes mellitus with hyperglycemia, with long-term current use of insulin (HCC)     Diabetes Mellitus type 2, complicated by no known complications. - Diabetic status / severity: Controlled.  Lab Results  Component Value Date   HGBA1C 6.3 04/17/2023    - Hemoglobin A1c goal <7%   Change basal rates mainly to avoid hypoglycemia as follows.  Discussed about bolusing for each meal about 10 to 15 minutes before eating.  - Medications:  OmniPod Dash insulin Pump setting:   Basal MN- 0.25u/hour, changed to 0.5 4AM- 0.75  8:30AM-1.25, changed to 1.1 10AM- 1.5, changed to 1.1 2PM-    1.0 6PM-    1.7, changed to 1.2  Bolus CHO Ratio (1unit:CHO) MN- 1:10  Correction/Sensitivity: MN- 1:60 6AM- 1:50 10PM- 1:60  Target: 120  Active insulin time: 4 hours  Metformin 1000 mg daily.  - Home glucose testing: continue CGM and check blood glucose as needed.  -  Discussed/ Gave Hypoglycemia treatment plan.  # Consult : not required at this time.   # Annual urine for microalbuminuria/ creatinine ratio, no microalbuminuria currently, continue ACE/ARB /losartan.  Will check urine microalbumin creatinine ratio today. Last  Lab Results  Component Value Date   MICRALBCREAT 8.6 05/22/2022    # Foot check nightly.  # Annual dilated diabetic eye exams.   - Diet: Make healthy diabetic food choices - Life  style / activity / exercise: Discussed.  2. Blood pressure  -  BP Readings from Last 1 Encounters:  05/03/23 138/80    - Control is in target.  - No change in current plans.  3. Lipid status / Hyperlipidemia - Last  Lab Results  Component Value Date   LDLCALC 42 12/13/2022   - Continue pravastatin 20 mg daily. He previously had been on Slo-Niacin OTC 500 mg twice a day for low HDL but had difficulty tolerating this.  Diagnoses and all orders for this visit:  Type 2 diabetes mellitus with hyperglycemia, with long-term current use of insulin (HCC) -     BASIC METABOLIC PANEL WITH GFR -     Microalbumin / creatinine urine ratio   Patient lab with elevated BUN and mildly elevated serum potassium, will check BMP today.  Patient is advised for well hydration daily.   DISPOSITION Follow up in clinic in 3 months suggested.   All questions answered and patient verbalized understanding of the plan.  [place] Hashir Deleeuw, MD [facility] Endocrinology [facility] [address] [place], [address] [place], [place] [address] Phone # [phone]  At least part of this note was generated using voice recognition software. Inadvertent word errors may have occurred, which were not recognized during the proofreading process.

## 2023-05-04 LAB — BASIC METABOLIC PANEL WITH GFR
BUN: 16 mg/dL (ref 7–25)
CO2: 23 mmol/L (ref 20–32)
Calcium: 9.3 mg/dL (ref 8.6–10.3)
Chloride: 108 mmol/L (ref 98–110)
Creat: 0.77 mg/dL (ref 0.70–1.28)
Glucose, Bld: 164 mg/dL — ABNORMAL HIGH (ref 65–99)
Potassium: 4.9 mmol/L (ref 3.5–5.3)
Sodium: 139 mmol/L (ref 135–146)
eGFR: 93 mL/min/{1.73_m2} (ref >=60)

## 2023-05-04 LAB — MICROALBUMIN / CREATININE URINE RATIO
Creatinine, Urine: 74 mg/dL (ref 20–320)
Microalb Creat Ratio: 136 mg/g{creat} — ABNORMAL HIGH (ref <30)
Microalb, Ur: 10.1 mg/dL

## 2023-05-06 ENCOUNTER — Encounter: Payer: Self-pay | Admitting: Endocrinology

## 2023-06-03 ENCOUNTER — Encounter: Payer: Self-pay | Admitting: Endocrinology

## 2023-06-05 ENCOUNTER — Other Ambulatory Visit: Payer: Self-pay

## 2023-06-05 DIAGNOSIS — E1165 Type 2 diabetes mellitus with hyperglycemia: Secondary | ICD-10-CM

## 2023-06-05 MED ORDER — FREESTYLE LIBRE 3 SENSOR MISC: 3 refills | Status: DC

## 2023-06-06 ENCOUNTER — Other Ambulatory Visit: Payer: Self-pay

## 2023-07-30 ENCOUNTER — Encounter: Payer: Self-pay | Admitting: Endocrinology

## 2023-07-30 ENCOUNTER — Ambulatory Visit: Payer: PPO | Admitting: Endocrinology

## 2023-07-30 VITALS — BP 138/70 | HR 70 | Resp 20 | Ht 70.0 in | Wt 165.6 lb

## 2023-07-30 DIAGNOSIS — E1165 Type 2 diabetes mellitus with hyperglycemia: Secondary | ICD-10-CM

## 2023-07-30 DIAGNOSIS — E538 Deficiency of other specified B group vitamins: Secondary | ICD-10-CM | POA: Diagnosis not present

## 2023-07-30 DIAGNOSIS — Z794 Long term (current) use of insulin: Secondary | ICD-10-CM | POA: Diagnosis not present

## 2023-07-30 LAB — POCT GLYCOSYLATED HEMOGLOBIN (HGB A1C): Hemoglobin A1C: 6.1 % — AB (ref 4.0–5.6)

## 2023-07-30 NOTE — Patient Instructions (Signed)
 Latest Reference Range & Units 12/13/22 10:52 04/17/23 11:43 07/30/23 11:27  Hemoglobin A1C 4.0 - 5.6 % 6.2 6.3 6.1 !  !: Data is abnormal

## 2023-07-30 NOTE — Progress Notes (Signed)
 Outpatient Endocrinology Note Manuel Johany Hansman, MD   Patient's Name: Manuel Petty    DOB: [birth year]    MRN: [id number]                                                    REASON OF VISIT: Follow up for type 2 diabetes mellitus  PCP: Wanda Plump, MD  HISTORY OF PRESENT ILLNESS:   Manuel Petty is a 78 y.o. old male with past medical history listed below, is here for follow up for type 2 diabetes mellitus.   Pertinent Diabetes History: Patient was previously seen by Dr. Lucianne Muss and was last time seen in August 2024.  Patient was diagnosed with type 2 diabetes mellitus in 2007.  Patient has been lately on insulin pump therapy, started in March 2020.  Chronic Diabetes Complications : Retinopathy: no. Last ophthalmology exam was done on 05/2022, following with ophthalmology regularly.  Nephropathy: no, on ACE/ARB /losartan.  He has solitary kidney.  Status post nephrectomy for renal cancer in 1992. Peripheral neuropathy: no Coronary artery disease: no Stroke: no  Relevant comorbidities and cardiovascular risk factors: Obesity: no Body mass index is 23.76 kg/m.  Hypertension: Yes  Hyperlipidemia : Yes, on statin.   Current / Home Diabetic regimen includes:  Metformin 1000 mg daily.  OmniPod Dash, using Humalog U100.  He has been on freestyle libre 3 CGM.  Insulin Pump setting:  Basal MN- 0.5u/hour 8:30AM-1.1 2PM-    1.0 6PM-    1.2  Bolus CHO Ratio (1unit:CHO) MN- 1:10  Correction/Sensitivity: MN- 1:60 6AM- 1:50 10PM- 1:60  Target: 120  Active insulin time: 4 hours  Prior diabetic medications:  CONTINUOUS GLUCOSE MONITORING SYSTEM (CGMS) / INSULIN PUMP INTERPRETATION:                         OmniPod Dash Download (Reviewed and summarized below.)  Dates: March 5 to July 30, 2023, 14 days   Average daily carbs entered: 58 g Average total daily insulin:  36 units, Basal: 63%, Bolus: 37%  He was recommended for OmniPod 5 previously however he does  not want to be on Dexcom CGM and preferred to stay on OmniPod Dash.    Glycemic data:    CONTINUOUS GLUCOSE MONITORING SYSTEM (CGMS) INTERPRETATION: At today's visit, we reviewed CGM downloads. The full report is scanned in the media. Reviewing the CGM trends, blood glucose are as follows:  FreeStyle Libre 3 CGM-  Sensor Download (Sensor download was reviewed and summarized below.) Dates: March 5 to July 30, 2023, 14 days   Glucose Management Indicator: 6.8%    Interpretation: Mostly acceptable blood sugar, with random mild hyperglycemia with blood sugar up to 200 range postprandially related to no boluses.  On March 11 and 12 noted to have hypoglycemia in the early morning and in the evening, patient reports related to reports of the timing a.m. and p.m. due to daylight saving.  No concerning hypoglycemia.  GMI 6.8%.  Bolusing for meals 0-4 times a day generally.  Hypoglycemia: Patient has minor hypoglycemic episodes. Patient has hypoglycemia awareness.    Factors modifying glucose control: 1.  Diabetic diet assessment: 2-3 meals a day.  2.  Staying active or exercising:   3.  Medication compliance: compliant all of the time.  # B12 deficiency: Adequately  replaced and he needs to continue his supplement long-term.  Interval history  Pump data and CGM data as reviewed above.  Hemoglobin A1c improved to 6.1%.  Reports occasional diarrhea with the metformin otherwise no complaints today.  REVIEW OF SYSTEMS As per history of present illness.   PAST MEDICAL HISTORY: Past Medical History:  Diagnosis Date   Diabetes mellitus (HCC) 2,000   Erectile dysfunction    HTN (hypertension)    Hyperlipidemia    Personal history of colonic adenomas 07/18/2009   Renal cancer (HCC)    1992, has 1 kidney ; does not see urology any more    PAST SURGICAL HISTORY: Past Surgical History:  Procedure Laterality Date   COLONOSCOPY     HAND SURGERY Right 06/2022   NEPHRECTOMY  1992     ALLERGIES: No Known Allergies  FAMILY HISTORY:  Family History  Problem Relation Age of Onset   Diabetes Father    Hypertension Father    CAD Father        F (MI age 31), Brother in hois 68   Lymphoma Sister    Dementia Brother        parkinsons? dementia?   Prostate cancer Brother        dx age 24   Cancer Brother        blood cancer    CAD Brother    Colon cancer Neg Hx    Stroke Neg Hx     SOCIAL HISTORY: Social History   Socioeconomic History   Marital status: Married    Spouse name: Not on file   Number of children: 1   Years of education: Not on file   Highest education level: Not on file  Occupational History   Occupation: retired   Tobacco Use   Smoking status: Former   Smokeless tobacco: Never   Tobacco comments:    quit in 1992, 2 ppd  Substance and Sexual Activity   Alcohol use: Yes    Alcohol/week: 2.0 standard drinks of alcohol    Types: 2 Cans of beer per week   Drug use: No    Comment: used to use marihuana     Sexual activity: Not on file  Other Topics Concern   Not on file  Social History Narrative   Lives w/ wife   Social Drivers of Corporate investment banker Strain: Not on file  Food Insecurity: Not on file  Transportation Needs: Not on file  Physical Activity: Not on file  Stress: Not on file  Social Connections: Not on file    MEDICATIONS:  Current Outpatient Medications  Medication Sig Dispense Refill   Blood Glucose Monitoring Suppl (ONETOUCH VERIO) w/Device KIT Use to check blood sugar daily 1 kit 0   Continuous Glucose Sensor (FREESTYLE LIBRE 3 SENSOR) MISC APPLY SENSOR TO SKIN FOR CONTINUOUS BLOOD SUGAR MONITORING, REPLACE EVERY 14 DAYS 2 each 3   HUMALOG 100 UNIT/ML injection INJECT UNDER THE SKIN 68 UNITS DAILY 20 mL 2   Insulin Disposable Pump (OMNIPOD 5 G6 PODS, GEN 5,) MISC 1 Device by Does not apply route every 3 (three) days. 2 each 3   Insulin Disposable Pump (OMNIPOD DASH PODS, GEN 4,) MISC CHANGE POD EVERY  72 HOURS AS DIRECTED 30 each 6   Insulin Disposable Pump (OMNIPOD DASH SYSTEM) KIT 1 each by Other route See admin instructions. Use continuously to administer insulin.     losartan (COZAAR) 50 MG tablet Take 1.5 tablets (75 mg total) by  mouth daily. 135 tablet 3   metFORMIN (GLUCOPHAGE) 1000 MG tablet TAKE 1 TABLET(1000 MG) BY MOUTH DAILY 90 tablet 1   niacin 500 MG tablet Take 500 mg by mouth at bedtime.     pravastatin (PRAVACHOL) 20 MG tablet TAKE 1 TABLET(20 MG) BY MOUTH DAILY 90 tablet 1   Current Facility-Administered Medications  Medication Dose Route Frequency Provider Last Rate Last Admin   methylPREDNISolone acetate (DEPO-MEDROL) injection 40 mg  40 mg Intra-articular Once Myra Rude, MD        PHYSICAL EXAM: Vitals:   07/30/23 1112  BP: 138/70  Pulse: 70  Resp: 20  SpO2: 98%  Weight: 165 lb 9.6 oz (75.1 kg)  Height: 5\' 10"  (1.778 m)   Body mass index is 23.76 kg/m.  Wt Readings from Last 3 Encounters:  07/30/23 165 lb 9.6 oz (75.1 kg)  05/03/23 168 lb 3.2 oz (76.3 kg)  12/19/22 172 lb (78 kg)    General: Well developed, well nourished male in no apparent distress.  HEENT: AT/Ogema, no external lesions.  Eyes: Conjunctiva clear and no icterus. Neck: Neck supple  Lungs: Respirations not labored Neurologic: Alert, oriented, normal speech Extremities / Skin: Dry. No sores or rashes noted. No acanthosis nigricans Psychiatric: Does not appear depressed or anxious  Diabetic Foot Exam - Simple   Simple Foot Form Diabetic Foot exam was performed with the following findings: Yes 07/30/2023 12:00 PM  Visual Inspection No deformities, no ulcerations, no other skin breakdown bilaterally: Yes Sensation Testing Intact to touch and monofilament testing bilaterally: Yes Pulse Check Posterior Tibialis and Dorsalis pulse intact bilaterally: Yes Comments    LABS Reviewed Lab Results  Component Value Date   HGBA1C 6.1 (A) 07/30/2023   HGBA1C 6.3 04/17/2023   HGBA1C  6.2 12/13/2022   Lab Results  Component Value Date   FRUCTOSAMINE 261 11/12/2017   Lab Results  Component Value Date   CHOL 87 12/13/2022   HDL 25.20 (L) 12/13/2022   LDLCALC 42 12/13/2022   LDLDIRECT 45.7 05/31/2009   TRIG 101.0 12/13/2022   CHOLHDL 3 12/13/2022   Lab Results  Component Value Date   MICRALBCREAT 136 (H) 05/03/2023   MICRALBCREAT 8.6 05/22/2022   Lab Results  Component Value Date   CREATININE 0.77 05/03/2023   Lab Results  Component Value Date   GFR 87.13 04/17/2023    ASSESSMENT / PLAN  1. Type 2 diabetes mellitus with hyperglycemia, with long-term current use of insulin (HCC)   2. B12 deficiency      Diabetes Mellitus type 2, complicated by no known complications. - Diabetic status / severity: Controlled.  Lab Results  Component Value Date   HGBA1C 6.1 (A) 07/30/2023    - Hemoglobin A1c goal <7%   Overall acceptable blood sugar based on CGM data.  He had mild hypoglycemia, reports due to reverse of a.m. and p.m. after changing time after daylight saving on March 9, he corrected after 2 to 3 days.   - Medications:  OmniPod Dash insulin Pump setting: No change on the pump settings today.  Continue metformin 1000 mg daily.  If GI issues become a problem consider changing to extended release or decreasing the dose in the future visit.  - Home glucose testing: continue CGM and check blood glucose as needed.  - Discussed/ Gave Hypoglycemia treatment plan.  # Consult : not required at this time.   # Annual urine for microalbuminuria/ creatinine ratio, no microalbuminuria currently, continue ACE/ARB /losartan.  Elevated urine  microalbumin creatinine ratio was a error from the software.  Recheck in follow-up visit. Last  Lab Results  Component Value Date   MICRALBCREAT 136 (H) 05/03/2023    # Foot check nightly.  # Annual dilated diabetic eye exams.   - Diet: Make healthy diabetic food choices - Life style / activity / exercise:  Discussed.  2. Blood pressure  -  BP Readings from Last 1 Encounters:  07/30/23 138/70    - Control is in target.  - No change in current plans.  3. Lipid status / Hyperlipidemia - Last  Lab Results  Component Value Date   LDLCALC 42 12/13/2022   - Continue pravastatin 20 mg daily. He previously had been on Slo-Niacin OTC 500 mg twice a day for low HDL but had difficulty tolerating this.  Diagnoses and all orders for this visit:  Type 2 diabetes mellitus with hyperglycemia, with long-term current use of insulin (HCC) -     POCT glycosylated hemoglobin (Hb A1C) -     Basic Metabolic Panel (BMET); Future -     CBC w/Diff; Future -     Hemoglobin A1c; Future -     Lipid panel; Future -     Microalbumin / creatinine urine ratio; Future  B12 deficiency -     Vitamin B12; Future     DISPOSITION Follow up in clinic in 4 months suggested.  Labs prior to follow-up visit as ordered, he wants to complete at local [place] lab 1 week prior to follow-up visit.   All questions answered and patient verbalized understanding of the plan.  Manuel Manuel Fonder, MD [facility] Endocrinology [facility] [address] [place], [address] [place], [place] [address] Phone # [phone]  At least part of this note was generated using voice recognition software. Inadvertent word errors may have occurred, which were not recognized during the proofreading process.

## 2023-07-31 ENCOUNTER — Encounter: Payer: Self-pay | Admitting: Endocrinology

## 2023-09-11 ENCOUNTER — Ambulatory Visit: Admitting: Internal Medicine

## 2023-09-11 ENCOUNTER — Encounter: Payer: Self-pay | Admitting: Internal Medicine

## 2023-09-11 VITALS — BP 126/70 | HR 74 | Temp 98.4°F | Resp 16 | Ht 70.0 in | Wt 163.5 lb

## 2023-09-11 DIAGNOSIS — E785 Hyperlipidemia, unspecified: Secondary | ICD-10-CM

## 2023-09-11 DIAGNOSIS — E119 Type 2 diabetes mellitus without complications: Secondary | ICD-10-CM

## 2023-09-11 DIAGNOSIS — I10 Essential (primary) hypertension: Secondary | ICD-10-CM | POA: Diagnosis not present

## 2023-09-11 DIAGNOSIS — Z0001 Encounter for general adult medical examination with abnormal findings: Secondary | ICD-10-CM

## 2023-09-11 DIAGNOSIS — R399 Unspecified symptoms and signs involving the genitourinary system: Secondary | ICD-10-CM | POA: Diagnosis not present

## 2023-09-11 DIAGNOSIS — E1169 Type 2 diabetes mellitus with other specified complication: Secondary | ICD-10-CM

## 2023-09-11 DIAGNOSIS — E538 Deficiency of other specified B group vitamins: Secondary | ICD-10-CM

## 2023-09-11 DIAGNOSIS — Z0000 Encounter for general adult medical examination without abnormal findings: Secondary | ICD-10-CM | POA: Diagnosis not present

## 2023-09-11 MED ORDER — TAMSULOSIN HCL 0.4 MG PO CAPS: 0.4000 mg | ORAL_CAPSULE | Freq: Every day | ORAL | 3 refills | Status: AC

## 2023-09-11 NOTE — Patient Instructions (Addendum)
  INSTRUCTIONS  FOR TODAY  Vaccines to consider, flu shot every fall COVID booster from 01-2023 if not done.  Check the  blood pressure regularly Blood pressure goal:  between 110/65 and  135/85. If it is consistently higher or lower, let me know     GO TO THE LAB : Get the blood work     Next office visit for a physical exam in 1 year Please make an appointment before you leave today Depending on your blood or XRs results it might be necessary to come back sooner      Per our records you are due for your diabetic eye exam. Please contact your eye doctor to schedule an appointment. Please have them send copies of your office visit notes to us . Our fax number is [phone]. If you need a referral to an eye doctor please let us  know.

## 2023-09-11 NOTE — Progress Notes (Signed)
 Subjective:    Patient ID: Manuel Petty, male    DOB: 25-Nov-1945, 78 y.o.   MRN: [id number]  DOS:  09/11/2023 Type of visit - description: cpx  Here for CPX. Feeling well. From time to time urinary flow is slow.  Denies dysuria or gross hematuria.  Wt Readings from Last 3 Encounters:  09/11/23 163 lb 8 oz (74.2 kg)  07/30/23 165 lb 9.6 oz (75.1 kg)  05/03/23 168 lb 3.2 oz (76.3 kg)     Review of Systems  Other than above, a 14 point review of systems is negative     Past Medical History:  Diagnosis Date   Diabetes mellitus (HCC) 2,000   Erectile dysfunction    HTN (hypertension)    Hyperlipidemia    Personal history of colonic adenomas 07/18/2009   Renal cancer (HCC)    1992, has 1 kidney ; does not see urology any more    Past Surgical History:  Procedure Laterality Date   COLONOSCOPY     HAND SURGERY Right 06/2022   NEPHRECTOMY  1992    Current Outpatient Medications  Medication Instructions   Continuous Glucose Sensor (FREESTYLE LIBRE 3 SENSOR) MISC APPLY SENSOR TO SKIN FOR CONTINUOUS BLOOD SUGAR MONITORING, REPLACE EVERY 14 DAYS   HUMALOG  100 UNIT/ML injection INJECT UNDER THE SKIN 68 UNITS DAILY   Insulin  Disposable Pump (OMNIPOD DASH PODS, GEN 4,) MISC CHANGE POD EVERY 72 HOURS AS DIRECTED   losartan  (COZAAR ) 75 mg, Oral, Daily   metFORMIN  (GLUCOPHAGE ) 1000 MG tablet TAKE 1 TABLET(1000 MG) BY MOUTH DAILY   niacin (VITAMIN B3) 500 mg, Daily at bedtime   pravastatin  (PRAVACHOL ) 20 MG tablet TAKE 1 TABLET(20 MG) BY MOUTH DAILY       Objective:   Physical Exam BP 126/70   Pulse 74   Temp 98.4 F (36.9 C) (Oral)   Resp 16   Ht 5\' 10"  (1.778 m)   Wt 163 lb 8 oz (74.2 kg)   SpO2 97%   BMI 23.46 kg/m  General: Well developed, NAD, BMI noted Neck: No  thyromegaly  HEENT:  Normocephalic . Face symmetric, atraumatic Lungs:  CTA B Normal respiratory effort, no intercostal retractions, no accessory muscle use. Heart: RRR,  no murmur.   Abdomen:  Not distended, soft, non-tender. No rebound or rigidity.   Lower extremities: no pretibial edema bilaterally DRE: Normal sphincter tone, brown stool, prostate normal Skin: Exposed areas without rash. Not pale. Not jaundice Neurologic:  alert & oriented X3.  Speech normal, gait appropriate for age and unassisted Strength symmetric and appropriate for age.  Psych: Cognition and judgment appear intact.  Cooperative with normal attention span and concentration.  Behavior appropriate. No anxious or depressed appearing.     Assessment   Assessment DM--pr endo, metformin - diarrhea HTN Hyperlipidemia. E.D. Renal cancer, 1992, nephrectomy x1, released from urology B12 deficiency   PLAN: Here for CPX -Td 09/2017 -  pneumonia shot 2016, prevnar 2016.  PNM 20: 2023 - zostavax : 2012; s/p  shingrex.  S/p RSV - UTD covid  -Vaccines are recommended: Flu shot every fall  -- [facility]  colonoscopy 3-11, multiple polyps, cscope 08-2016, Dr Willy Harvest, due for repeat colonoscopy, I believe he is healthy enough to have a colonoscopy however he is pretty certain he does not like to have another one.  Benefit of C-scope discussed with the patient today and he verbalized understanding. -Prostate ca screening:  brother dx w/ prostate ca age 83.  See comments under LUTS -  Lifestyle: Doing well, eating healthier. --  Labs reviewed.  Will get a UA, urine culture, BMP, AST, ALT FLP, CBC Other issues addressed today DM: Per Endo, LOV 07/30/2023.  (Stopped  metformin  due to diarrhea, symptoms improved) HTN: BP today is very good, continue losartan .  Check BMP and CBC. High cholesterol: On pravastatin , checking FLP, AST ALT. LUTS:Patient has mild difficulty urinating, recent PSA normal.  Will check UA, urine culture and start Flomax  as a trial. B12 deficiency: On OTCs, will check a B12, also requested vitamin D .   Will do RTC 1 year, sooner if needed.

## 2023-09-12 ENCOUNTER — Encounter: Payer: Self-pay | Admitting: Internal Medicine

## 2023-09-12 LAB — CBC WITH DIFFERENTIAL/PLATELET
Basophils Absolute: 0.1 10*3/uL (ref 0.0–0.1)
Basophils Relative: 0.8 % (ref 0.0–3.0)
Eosinophils Absolute: 0.2 10*3/uL (ref 0.0–0.7)
Eosinophils Relative: 2.7 % (ref 0.0–5.0)
HCT: 40.1 % (ref 39.0–52.0)
Hemoglobin: 13.3 g/dL (ref 13.0–17.0)
Lymphocytes Relative: 14.8 % (ref 12.0–46.0)
Lymphs Abs: 1.2 10*3/uL (ref 0.7–4.0)
MCHC: 33.2 g/dL (ref 30.0–36.0)
MCV: 97.7 fl (ref 78.0–100.0)
Monocytes Absolute: 0.6 10*3/uL (ref 0.1–1.0)
Monocytes Relative: 7.4 % (ref 3.0–12.0)
Neutro Abs: 5.9 10*3/uL (ref 1.4–7.7)
Neutrophils Relative %: 74.3 % (ref 43.0–77.0)
Platelets: 261 10*3/uL (ref 150.0–400.0)
RBC: 4.11 Mil/uL — ABNORMAL LOW (ref 4.22–5.81)
RDW: 14.5 % (ref 11.5–15.5)
WBC: 7.9 10*3/uL (ref 4.0–10.5)

## 2023-09-12 LAB — LIPID PANEL
Cholesterol: 105 mg/dL (ref 0–200)
HDL: 28.8 mg/dL — ABNORMAL LOW (ref >39.00)
LDL Cholesterol: 55 mg/dL (ref 0–99)
NonHDL: 76.54
Total CHOL/HDL Ratio: 4
Triglycerides: 110 mg/dL (ref 0.0–149.0)
VLDL: 22 mg/dL (ref 0.0–40.0)

## 2023-09-12 LAB — BASIC METABOLIC PANEL WITH GFR
BUN: 17 mg/dL (ref 6–23)
CO2: 23 meq/L (ref 19–32)
Calcium: 9.2 mg/dL (ref 8.4–10.5)
Chloride: 107 meq/L (ref 96–112)
Creatinine, Ser: 0.78 mg/dL (ref 0.40–1.50)
GFR: 87.22 mL/min (ref >60.00)
Glucose, Bld: 190 mg/dL — ABNORMAL HIGH (ref 70–99)
Potassium: 5.1 meq/L (ref 3.5–5.1)
Sodium: 136 meq/L (ref 135–145)

## 2023-09-12 LAB — VITAMIN D 25 HYDROXY (VIT D DEFICIENCY, FRACTURES): VITD: 14.09 ng/mL — ABNORMAL LOW (ref 30.00–100.00)

## 2023-09-12 LAB — AST: AST: 21 U/L (ref 0–37)

## 2023-09-12 LAB — URINE CULTURE
MICRO NUMBER:: 16395189
Result:: NO GROWTH
SPECIMEN QUALITY:: ADEQUATE

## 2023-09-12 LAB — B12 AND FOLATE PANEL
Folate: >25.2 ng/mL (ref >5.9)
Vitamin B-12: 238 pg/mL (ref 211–911)

## 2023-09-12 LAB — ALT: ALT: 30 U/L (ref 0–53)

## 2023-09-12 NOTE — Assessment & Plan Note (Addendum)
 Here for CPX -Td 09/2017 -  pneumonia shot 2016, prevnar 2016.  PNM 20: 2023 - zostavax : 2012; s/p  shingrex.  S/p RSV - UTD covid  -Vaccines are recommended: Flu shot every fall  -- [facility]  colonoscopy 3-11, multiple polyps, cscope 08-2016, Dr Willy Harvest, due for repeat colonoscopy, I believe he is healthy enough to have a colonoscopy however he is pretty certain he does not like to have another one.  Benefit of C-scope discussed with the patient today and he verbalized understanding. -Prostate ca screening:  brother dx w/ prostate ca age 47.  See comments under LUTS -Lifestyle: Doing well, eating healthier. --  Labs reviewed.  Will get a UA, urine culture, BMP, AST, ALT FLP, CBC

## 2023-09-12 NOTE — Assessment & Plan Note (Signed)
 Here for CPX  Other issues addressed today DM: Per Endo, LOV 07/30/2023.  (Stopped  metformin  due to diarrhea, symptoms improved) HTN: BP today is very good, continue losartan .  Check BMP and CBC. High cholesterol: On pravastatin , checking FLP, AST ALT. LUTS:Patient has mild difficulty urinating, recent PSA normal.  Will check UA, urine culture and start Flomax  as a trial. B12 deficiency: On OTCs, will check a B12, also requested vitamin D .   Will do RTC 1 year, sooner if needed.

## 2023-09-14 ENCOUNTER — Encounter: Payer: Self-pay | Admitting: Internal Medicine

## 2023-09-16 ENCOUNTER — Other Ambulatory Visit (INDEPENDENT_AMBULATORY_CARE_PROVIDER_SITE_OTHER)

## 2023-09-16 DIAGNOSIS — R399 Unspecified symptoms and signs involving the genitourinary system: Secondary | ICD-10-CM

## 2023-09-16 LAB — URINALYSIS, ROUTINE W REFLEX MICROSCOPIC
Bilirubin Urine: NEGATIVE
Hgb urine dipstick: NEGATIVE
Ketones, ur: NEGATIVE
Leukocytes,Ua: NEGATIVE
Nitrite: NEGATIVE
RBC / HPF: NONE SEEN (ref 0–?)
Specific Gravity, Urine: 1.02 (ref 1.000–1.030)
Total Protein, Urine: 30 — AB
Urine Glucose: NEGATIVE
Urobilinogen, UA: 0.2 (ref 0.0–1.0)
WBC, UA: NONE SEEN (ref 0–?)
pH: 6 (ref 5.0–8.0)

## 2023-09-16 MED ORDER — VITAMIN D (ERGOCALCIFEROL) 1.25 MG (50000 UNIT) PO CAPS: 50000.0000 [IU] | ORAL_CAPSULE | ORAL | 0 refills | Status: AC

## 2023-09-16 NOTE — Progress Notes (Signed)
 Specimen collected

## 2023-09-16 NOTE — Addendum Note (Signed)
 Addended by: Eden Toohey D on: 09/16/2023 07:46 AM   Modules accepted: Orders

## 2023-09-17 ENCOUNTER — Encounter: Payer: Self-pay | Admitting: Internal Medicine

## 2023-09-17 LAB — URINE CULTURE
MICRO NUMBER:: 16412968
Result:: NO GROWTH
SPECIMEN QUALITY:: ADEQUATE

## 2023-09-18 ENCOUNTER — Encounter: Payer: Self-pay | Admitting: Internal Medicine

## 2023-10-08 ENCOUNTER — Other Ambulatory Visit: Payer: Self-pay

## 2023-10-08 ENCOUNTER — Encounter: Payer: Self-pay | Admitting: Endocrinology

## 2023-10-08 DIAGNOSIS — E1165 Type 2 diabetes mellitus with hyperglycemia: Secondary | ICD-10-CM

## 2023-10-08 MED ORDER — FREESTYLE LIBRE 3 PLUS SENSOR MISC: 1.0000 | 3 refills | Status: AC

## 2023-10-09 ENCOUNTER — Encounter: Payer: Self-pay | Admitting: Internal Medicine

## 2023-10-19 ENCOUNTER — Other Ambulatory Visit: Payer: Self-pay | Admitting: Endocrinology

## 2023-10-19 DIAGNOSIS — E1165 Type 2 diabetes mellitus with hyperglycemia: Secondary | ICD-10-CM

## 2023-10-25 ENCOUNTER — Encounter: Payer: Self-pay | Admitting: Endocrinology

## 2023-10-28 NOTE — Telephone Encounter (Signed)
 I am not sure about ingredient of this supplements, and these supplements are not FDA regulated.  As long as it does not contain carbohydrates or high calorie, it should not affect your diabetes mellitus.

## 2023-10-30 ENCOUNTER — Other Ambulatory Visit: Payer: Self-pay | Admitting: Endocrinology

## 2023-10-30 DIAGNOSIS — E1165 Type 2 diabetes mellitus with hyperglycemia: Secondary | ICD-10-CM

## 2023-10-31 NOTE — Telephone Encounter (Signed)
 Refill request complete

## 2023-11-05 LAB — HM DIABETES EYE EXAM

## 2023-11-22 ENCOUNTER — Other Ambulatory Visit: Payer: Self-pay

## 2023-11-22 DIAGNOSIS — E1165 Type 2 diabetes mellitus with hyperglycemia: Secondary | ICD-10-CM

## 2023-11-22 DIAGNOSIS — I10 Essential (primary) hypertension: Secondary | ICD-10-CM

## 2023-11-22 MED ORDER — LOSARTAN POTASSIUM 50 MG PO TABS: 75.0000 mg | ORAL_TABLET | Freq: Every day | ORAL | 3 refills | Status: AC

## 2023-12-02 ENCOUNTER — Encounter: Payer: Self-pay | Admitting: Internal Medicine

## 2023-12-03 ENCOUNTER — Other Ambulatory Visit (INDEPENDENT_AMBULATORY_CARE_PROVIDER_SITE_OTHER)

## 2023-12-03 DIAGNOSIS — E538 Deficiency of other specified B group vitamins: Secondary | ICD-10-CM

## 2023-12-03 DIAGNOSIS — Z794 Long term (current) use of insulin: Secondary | ICD-10-CM | POA: Diagnosis not present

## 2023-12-03 DIAGNOSIS — E1165 Type 2 diabetes mellitus with hyperglycemia: Secondary | ICD-10-CM | POA: Diagnosis not present

## 2023-12-03 LAB — CBC WITH DIFFERENTIAL/PLATELET
Basophils Absolute: 0 K/uL (ref 0.0–0.1)
Basophils Relative: 0.7 % (ref 0.0–3.0)
Eosinophils Absolute: 0.3 K/uL (ref 0.0–0.7)
Eosinophils Relative: 5.6 % — ABNORMAL HIGH (ref 0.0–5.0)
HCT: 38.8 % — ABNORMAL LOW (ref 39.0–52.0)
Hemoglobin: 13 g/dL (ref 13.0–17.0)
Lymphocytes Relative: 22.6 % (ref 12.0–46.0)
Lymphs Abs: 1.3 K/uL (ref 0.7–4.0)
MCHC: 33.6 g/dL (ref 30.0–36.0)
MCV: 97 fl (ref 78.0–100.0)
Monocytes Absolute: 0.6 K/uL (ref 0.1–1.0)
Monocytes Relative: 9.4 % (ref 3.0–12.0)
Neutro Abs: 3.6 K/uL (ref 1.4–7.7)
Neutrophils Relative %: 61.7 % (ref 43.0–77.0)
Platelets: 272 K/uL (ref 150.0–400.0)
RBC: 4 Mil/uL — ABNORMAL LOW (ref 4.22–5.81)
RDW: 14 % (ref 11.5–15.5)
WBC: 5.9 K/uL (ref 4.0–10.5)

## 2023-12-03 LAB — LIPID PANEL
Cholesterol: 100 mg/dL (ref 0–200)
HDL: 29.6 mg/dL — ABNORMAL LOW (ref >39.00)
LDL Cholesterol: 47 mg/dL (ref 0–99)
NonHDL: 70.47
Total CHOL/HDL Ratio: 3
Triglycerides: 118 mg/dL (ref 0.0–149.0)
VLDL: 23.6 mg/dL (ref 0.0–40.0)

## 2023-12-03 LAB — BASIC METABOLIC PANEL WITH GFR
BUN: 20 mg/dL (ref 6–23)
CO2: 24 meq/L (ref 19–32)
Calcium: 9.3 mg/dL (ref 8.4–10.5)
Chloride: 106 meq/L (ref 96–112)
Creatinine, Ser: 0.84 mg/dL (ref 0.40–1.50)
GFR: 85.16 mL/min (ref >60.00)
Glucose, Bld: 136 mg/dL — ABNORMAL HIGH (ref 70–99)
Potassium: 5.1 meq/L (ref 3.5–5.1)
Sodium: 137 meq/L (ref 135–145)

## 2023-12-03 LAB — HEMOGLOBIN A1C: Hgb A1c MFr Bld: 6.6 % — ABNORMAL HIGH (ref 4.6–6.5)

## 2023-12-03 LAB — VITAMIN B12: Vitamin B-12: 298 pg/mL (ref 211–911)

## 2023-12-03 LAB — MICROALBUMIN / CREATININE URINE RATIO
Creatinine,U: 58.6 mg/dL
Microalb Creat Ratio: 131.9 mg/g — ABNORMAL HIGH (ref 0.0–30.0)
Microalb, Ur: 7.7 mg/dL — ABNORMAL HIGH (ref 0.0–1.9)

## 2023-12-05 ENCOUNTER — Other Ambulatory Visit: Payer: Self-pay | Admitting: Internal Medicine

## 2023-12-10 ENCOUNTER — Ambulatory Visit: Payer: Self-pay | Admitting: Endocrinology

## 2023-12-12 ENCOUNTER — Ambulatory Visit: Admitting: Endocrinology

## 2023-12-12 ENCOUNTER — Encounter: Payer: Self-pay | Admitting: Endocrinology

## 2023-12-12 VITALS — BP 130/60 | HR 64 | Resp 20 | Ht 70.0 in | Wt 159.4 lb

## 2023-12-12 DIAGNOSIS — E1165 Type 2 diabetes mellitus with hyperglycemia: Secondary | ICD-10-CM

## 2023-12-12 DIAGNOSIS — E782 Mixed hyperlipidemia: Secondary | ICD-10-CM | POA: Diagnosis not present

## 2023-12-12 DIAGNOSIS — Z794 Long term (current) use of insulin: Secondary | ICD-10-CM | POA: Diagnosis not present

## 2023-12-12 MED ORDER — OMNIPOD DASH PODS (GEN 4) MISC: 6 refills | Status: AC

## 2023-12-12 MED ORDER — EMPAGLIFLOZIN 10 MG PO TABS
10.0000 mg | ORAL_TABLET | Freq: Every day | ORAL | 3 refills | Status: DC
Start: 2023-12-12 — End: 2023-12-13

## 2023-12-12 MED ORDER — EMPAGLIFLOZIN 10 MG PO TABS: 10.0000 mg | ORAL_TABLET | Freq: Every day | ORAL | 3 refills | Status: DC

## 2023-12-12 NOTE — Progress Notes (Signed)
 Outpatient Endocrinology Note [place] Louie Flenner, MD   Patient's Name: Manuel Petty    DOB: 09/22/1945    MRN: [id number]                                                    REASON OF VISIT: Follow up for type 2 diabetes mellitus  PCP: Amon Aloysius BRAVO, MD  HISTORY OF PRESENT ILLNESS:   Manuel Petty is a 78 y.o. old male with past medical history listed below, is here for follow up for type 2 diabetes mellitus.   Pertinent Diabetes History: Patient was previously seen by Dr. Von and was last time seen in August 2024.  Patient was diagnosed with type 2 diabetes mellitus in 2007.  Patient has been lately on insulin  pump therapy, started in March 2020.  Chronic Diabetes Complications : Retinopathy: no. Last ophthalmology exam was done on 05/2022, following with ophthalmology regularly.  Nephropathy: no, on ACE/ARB /losartan .  He has solitary kidney.  Status post nephrectomy for renal cancer in 1992. Peripheral neuropathy: no Coronary artery disease: no Stroke: no  Relevant comorbidities and cardiovascular risk factors: Obesity: no Body mass index is 22.87 kg/m.  Hypertension: Yes  Hyperlipidemia : Yes, on statin.   Current / Home Diabetic regimen includes:  OmniPod Dash, using Humalog  U100.  He has been on freestyle libre 3 CGM.  Insulin  Pump setting:  Basal ( 20.5 units/hr) MN- 0.25u/hour 4 AM - 0.50 8:30AM-1.1 2PM-    1.0 6PM-    1.2  Bolus CHO Ratio (1unit:CHO) MN- 1:10  Correction/Sensitivity: MN- 1:60 6AM- 1:50 10PM- 1:60  Target: 120  Active insulin  time: 4 hours  Prior diabetic medications: Stopped metformin  due to diarrhea.  CONTINUOUS GLUCOSE MONITORING SYSTEM (CGMS) / INSULIN  PUMP INTERPRETATION:                         OmniPod Hollis Plum (Reviewed and summarized below.)  Dates: July 18 to July 31 , 2025, 14 days   Average daily carbs entered: 32 g Average total daily insulin :  28 units, Basal: 72%, Bolus: 28%  He was recommended for  OmniPod 5 previously however he does not want to be on Dexcom CGM and preferred to stay on OmniPod Dash.    Glycemic data:    CONTINUOUS GLUCOSE MONITORING SYSTEM (CGMS) INTERPRETATION: At today's visit, we reviewed CGM downloads. The full report is scanned in the media. Reviewing the CGM trends, blood glucose are as follows:  FreeStyle Libre 3 CGM-  Sensor Download (Sensor download was reviewed and summarized below.) Dates: July 18 to July 31 , 2025, 14 days   Glucose Management Indicator: 6.8%     Interpretation: Mostly acceptable blood sugars.  She had mild hyperglycemia especially in the morning with blood sugar up to 200s.  Sometimes mild hyperglycemia with blood sugar up to 290 5 in the afternoon related to the meals.  Rarely blood sugar in 60s, most of the time since ratio.  No concerning hypoglycemia.  He has been bolusing for meals 0-4 times a day , most of times with manual bolus and sometimes using carb count of 25.  Hypoglycemia: Patient has minor hypoglycemic episodes. Patient has hypoglycemia awareness.    Factors modifying glucose control: 1.  Diabetic diet assessment: 2-3 meals a day.  2.  Staying  active or exercising:   3.  Medication compliance: compliant all of the time.  # B12 deficiency: Adequately replaced and he needs to continue his supplement long-term.  Interval history  Pump and CGM data as reviewed above.  Hemoglobin A1c 6.6%.  Patient lab results reviewed with normal electrolytes and renal function.  Elevated urine microalbumin creatinine ratio of 131.  No longer taking metformin  for several months due to diarrhea.  No other complaints today.  REVIEW OF SYSTEMS As per history of present illness.   PAST MEDICAL HISTORY: Past Medical History:  Diagnosis Date   Diabetes mellitus (HCC) 2,000   Erectile dysfunction    HTN (hypertension)    Hyperlipidemia    Personal history of colonic adenomas 07/18/2009   Renal cancer (HCC)    1992, has 1  kidney ; does not see urology any more    PAST SURGICAL HISTORY: Past Surgical History:  Procedure Laterality Date   COLONOSCOPY     HAND SURGERY Right 06/2022   NEPHRECTOMY  1992    ALLERGIES: No Known Allergies  FAMILY HISTORY:  Family History  Problem Relation Age of Onset   Diabetes Father    Hypertension Father    CAD Father        F (MI age 34), Brother in hois 67   Lymphoma Sister    Dementia Brother        parkinsons? dementia?   Prostate cancer Brother        dx age 76   Cancer Brother        blood cancer    CAD Brother    Colon cancer Neg Hx    Stroke Neg Hx     SOCIAL HISTORY: Social History   Socioeconomic History   Marital status: Married    Spouse name: Not on file   Number of children: 1   Years of education: Not on file   Highest education level: Not on file  Occupational History   Occupation: retired   Tobacco Use   Smoking status: Former   Smokeless tobacco: Never   Tobacco comments:    quit in 1992, 2 ppd  Substance and Sexual Activity   Alcohol use: Not on file   Drug use: No    Comment: used to use marihuana     Sexual activity: Not on file  Other Topics Concern   Not on file  Social History Narrative   Lives w/ wife   Social Drivers of Corporate investment banker Strain: Not on file  Food Insecurity: Not on file  Transportation Needs: Not on file  Physical Activity: Not on file  Stress: Not on file  Social Connections: Not on file    MEDICATIONS:  Current Outpatient Medications  Medication Sig Dispense Refill   Cholecalciferol (VITAMIN D3) 50 MCG (2000 UT) CAPS Take by mouth.     Continuous Glucose Sensor (FREESTYLE LIBRE 3 PLUS SENSOR) MISC Inject 1 Device into the skin continuous. Change every 15 days 6 each 3   ferrous sulfate 325 (65 FE) MG tablet Take 325 mg by mouth daily with breakfast.     HUMALOG  100 UNIT/ML injection INJECT 68 UNITS UNDER THE SKIN ONCE DAILY 20 mL 2   losartan  (COZAAR ) 50 MG tablet Take 1.5  tablets (75 mg total) by mouth daily. 135 tablet 3   pravastatin  (PRAVACHOL ) 20 MG tablet TAKE 1 TABLET BY MOUTH DAILY 90 tablet 1   tamsulosin  (FLOMAX ) 0.4 MG CAPS capsule Take 1 capsule (0.4  mg total) by mouth daily after supper. 90 capsule 3   empagliflozin  (JARDIANCE ) 10 MG TABS tablet Take 1 tablet (10 mg total) by mouth daily before breakfast. 90 tablet 3   Insulin  Disposable Pump (OMNIPOD DASH PODS, GEN 4,) MISC CHANGE POD EVERY 72 HOURS AS DIRECTED 30 each 6   niacin 500 MG tablet Take 500 mg by mouth at bedtime. (Patient not taking: Reported on 12/12/2023)     No current facility-administered medications for this visit.    PHYSICAL EXAM: Vitals:   12/12/23 1103  BP: 130/60  Pulse: 64  Resp: 20  SpO2: 97%  Weight: 159 lb 6.4 oz (72.3 kg)  Height: 5' 10 (1.778 m)    Body mass index is 22.87 kg/m.  Wt Readings from Last 3 Encounters:  12/12/23 159 lb 6.4 oz (72.3 kg)  09/11/23 163 lb 8 oz (74.2 kg)  07/30/23 165 lb 9.6 oz (75.1 kg)    General: Well developed, well nourished male in no apparent distress.  HEENT: AT/[place], no external lesions.  Eyes: Conjunctiva clear and no icterus. Neck: Neck supple  Lungs: Respirations not labored Neurologic: Alert, oriented, normal speech Extremities / Skin: Dry. Psychiatric: Does not appear depressed or anxious  Diabetic Foot Exam - Simple   No data filed    LABS Reviewed Lab Results  Component Value Date   HGBA1C 6.6 (H) 12/03/2023   HGBA1C 6.1 (A) 07/30/2023   HGBA1C 6.3 04/17/2023   Lab Results  Component Value Date   FRUCTOSAMINE 261 11/12/2017   Lab Results  Component Value Date   CHOL 100 12/03/2023   HDL 29.60 (L) 12/03/2023   LDLCALC 47 12/03/2023   LDLDIRECT 45.7 05/31/2009   TRIG 118.0 12/03/2023   CHOLHDL 3 12/03/2023   Lab Results  Component Value Date   MICRALBCREAT 131.9 (H) 12/03/2023   MICRALBCREAT 136 (H) 05/03/2023   Lab Results  Component Value Date   CREATININE 0.84 12/03/2023   Lab  Results  Component Value Date   GFR 85.16 12/03/2023    ASSESSMENT / PLAN  1. Type 2 diabetes mellitus with hyperglycemia, with long-term current use of insulin  (HCC)   2. Mixed hyperlipidemia     Diabetes Mellitus type 2, complicated by microalbuminuria. - Diabetic status / severity: Controlled.  Lab Results  Component Value Date   HGBA1C 6.6 (H) 12/03/2023    - Hemoglobin A1c goal <6.5%   Mostly acceptable blood sugar on CGM freestyle libre 3.  - Medications:  OmniPod Dash insulin  Pump setting: No change on the pump settings today.  Start Jardiance  10 mg daily.  Discussed that after being on Jardiance  if blood sugar is low, need to adjust pump setting asked to call our clinic.  - Home glucose testing: continue CGM and check blood glucose as needed.  - Discussed/ Gave Hypoglycemia treatment plan.  # Consult : not required at this time.   # Annual urine for microalbuminuria/ creatinine ratio, + microalbuminuria currently, continue ACE/ARB /losartan .   start Jardiance  SGLT2 inhibitor due to microalbuminuria. Last  Lab Results  Component Value Date   MICRALBCREAT 131.9 (H) 12/03/2023    # Foot check nightly.  # Annual dilated diabetic eye exams.   - Diet: Make healthy diabetic food choices - Life style / activity / exercise: Discussed.  2. Blood pressure  -  BP Readings from Last 1 Encounters:  12/12/23 130/60    - Control is in target.  - No change in current plans.  3. Lipid status /  Hyperlipidemia - Last  Lab Results  Component Value Date   LDLCALC 47 12/03/2023   - Continue pravastatin  20 mg daily.  He previously had been on Slo-Niacin OTC 500 mg twice a day for low HDL but had difficulty tolerating this.  Diagnoses and all orders for this visit:  Type 2 diabetes mellitus with hyperglycemia, with long-term current use of insulin  (HCC) -     Discontinue: empagliflozin  (JARDIANCE ) 10 MG TABS tablet; Take 1 tablet (10 mg total) by mouth daily  before breakfast. -     Insulin  Disposable Pump (OMNIPOD DASH PODS, GEN 4,) MISC; CHANGE POD EVERY 72 HOURS AS DIRECTED -     empagliflozin  (JARDIANCE ) 10 MG TABS tablet; Take 1 tablet (10 mg total) by mouth daily before breakfast.  Mixed hyperlipidemia    DISPOSITION Follow up in clinic in 3 months suggested.    All questions answered and patient verbalized understanding of the plan.  [place] Shinita Mac, MD [facility] [address] [place], [address] [place], [place] [address] Phone # [phone]  At least part of this note was generated using voice recognition software. Inadvertent word errors may have occurred, which were not recognized during the proofreading process.

## 2023-12-12 NOTE — Telephone Encounter (Signed)
 Jardiance  does not have generic.  You can check with your insurance if any other similar medication for example Farxiga would be covered.  In that case I can send prescription for Farxiga.

## 2023-12-13 MED ORDER — EMPAGLIFLOZIN 10 MG PO TABS: 10.0000 mg | ORAL_TABLET | Freq: Every day | ORAL | 3 refills | Status: AC

## 2023-12-13 NOTE — Telephone Encounter (Signed)
 I resent Jardiance  10 mg daily for 100 days supply.

## 2024-03-10 ENCOUNTER — Ambulatory Visit

## 2024-03-10 ENCOUNTER — Other Ambulatory Visit: Payer: Self-pay

## 2024-03-10 VITALS — BP 122/60 | Ht 70.0 in | Wt 159.0 lb

## 2024-03-10 DIAGNOSIS — M67952 Unspecified disorder of synovium and tendon, left thigh: Secondary | ICD-10-CM

## 2024-03-10 DIAGNOSIS — M25552 Pain in left hip: Secondary | ICD-10-CM

## 2024-03-10 DIAGNOSIS — M7062 Trochanteric bursitis, left hip: Secondary | ICD-10-CM

## 2024-03-10 NOTE — Progress Notes (Signed)
 Subjective:    Patient ID: Manuel Petty, male    DOB: 78 y.o., [birth year]   MRN: [id number]  Chief Complaint: Left hip pain  Discussed the use of AI scribe software for clinical note transcription with the patient, who gave verbal consent to proceed.  History of Present Illness Manuel Petty is a 78 year old male who presents with left hip pain.  Left hip pain - Throbbing pain localized to the left hip - Occurs with standing, such as during cooking or working in his shop - Worsens with prolonged standing or walking - Absent while sitting or sleeping - No radiation of pain - Shifting weight to the right foot exacerbates discomfort - Sitting provides the most relief  Pain management - Manages pain with aspirin, taking two tablets before bed if needed  Functional impact - Retired and engages in woodworking in his garage, which does not exacerbate pain   Review of pertinent imaging: No imaging on file of his left hip.    Objective:   Vitals:   03/10/24 1401  BP: 122/60    Left hip (compared to normal) -Inspection: No swelling or skin changes. No leg length discrepancy. No gait abnormalities with walking. -Palpation: TTP - level ASIS, - level AIIS, - adductor, - greater trochanter, - SI joint, - over piriformis -AROM/PROM: Flexion 120 deg, abduction 45 deg, adduction 20 deg, extension 10 deg, ER 45 deg, IR 25 deg, low hamstring flexibility -Strength: 5/5 flexion, 5/5 abduction, 5/5 adduction, 5/5 extension, mild medial motion of the knee with normal one legged squat  -Special tests: -  FADIR, -  log roll, + Debby, - Ober, - Noble, - FABER, - piriformis testing, - SLR, - hop test   Limited ultrasound examination of the left hip: There appears to be a small calcific deposit present in the body of the gluteus medius tendon.  Heterogeneous echotexture of the gluteus medius tendon.  Slightly increased hypoechogenic fluid signal present within the greater  trochanteric bursa.  Normal-appearing gluteus minimus tendon.  No cortical disruptions of the greater trochanter.  The gluteus maximus contribution to the IT band is well-visualized and appears normal. Interpretation: Mild greater trochanteric bursitis mild calcific tendinosis of the gluteus medius tendon    Assessment & Plan:   Assessment & Plan Left greater trochanteric bursitis and gluteus medius tendinosis with calcific deposits   Chronic left hip pain is localized to the greater trochanteric region, worsened by standing and relieved by sitting. Ultrasound shows calcific deposits in the gluteus medius tendon and mild bursitis. There is no significant tenderness on physical exam, but pain occurs with prolonged standing. This condition aligns with chronic tendinosis and bursitis, likely from previous minor trauma or overuse. More severe conditions like bone cancer have been ruled out. Treatment options discussed include shockwave therapy and prolotherapy. Shockwave therapy, though effective, is not covered by insurance. Prolotherapy, involving hypertonic dextrose injections, is covered and poses minimal risk, suitable even for diabetics. Anti-inflammatories are advised against during treatment to preserve therapeutic effects. Schedule prolotherapy injections weekly for two weeks, with a potential third if improvement is noted. Refer to physical therapy for exercises to strengthen the gluteus medius tendon and provide home exercise instructions. Advise against aspirin and alcohol during treatment. Follow up with the scheduler to arrange the first prolotherapy session.  Greater than 40 minutes were spent in face-to-face time discussing with the patient today his diagnosis, eliciting history, discussing the findings of his imaging, discussing treatment options, and describing what  follow-up would look like for this condition.

## 2024-03-17 ENCOUNTER — Encounter: Payer: Self-pay | Admitting: Endocrinology

## 2024-03-17 ENCOUNTER — Ambulatory Visit: Payer: Self-pay | Admitting: Endocrinology

## 2024-03-17 ENCOUNTER — Ambulatory Visit: Admitting: Endocrinology

## 2024-03-17 VITALS — BP 138/62 | HR 63 | Resp 18 | Ht 68.0 in | Wt 156.4 lb

## 2024-03-17 DIAGNOSIS — Z794 Long term (current) use of insulin: Secondary | ICD-10-CM

## 2024-03-17 DIAGNOSIS — E1165 Type 2 diabetes mellitus with hyperglycemia: Secondary | ICD-10-CM

## 2024-03-17 LAB — POCT GLYCOSYLATED HEMOGLOBIN (HGB A1C): Hemoglobin A1C: 6.3 % — AB (ref 4.0–5.6)

## 2024-03-17 NOTE — Progress Notes (Signed)
 Outpatient Endocrinology Note Marcelle Hepner, MD   Patient's Name: Manuel Petty    DOB: 03/25/1946    MRN: [id number]                                                    REASON OF VISIT: Follow up for type 2 diabetes mellitus  PCP: Amon Aloysius BRAVO, MD  HISTORY OF PRESENT ILLNESS:   Manuel Petty is a 78 y.o. old male with past medical history listed below, is here for follow up for type 2 diabetes mellitus.   Pertinent Diabetes History: Patient was previously seen by Dr. Von and was last time seen in August 2024.  Patient was diagnosed with type 2 diabetes mellitus in 2007.  Patient has been lately on insulin  pump therapy, started in March 2020.  Chronic Diabetes Complications : Retinopathy: no. Last ophthalmology exam was done on 05/2022, following with ophthalmology regularly.  Nephropathy: microalbuminuria, on ACE/ARB /losartan .  He has solitary kidney.  Status post nephrectomy for renal cancer in 1992. Peripheral neuropathy: no Coronary artery disease: no Stroke: no  Relevant comorbidities and cardiovascular risk factors: Obesity: no Body mass index is 23.78 kg/m.  Hypertension: Yes  Hyperlipidemia : Yes, on statin.   Current / Home Diabetic regimen includes:  Jardiance  10 mg daily.  OmniPod Dash, using Humalog  U100.  He has been on freestyle libre 3 CGM.  Insulin  Pump setting:  Basal  MN- 0.25u/hour 4 AM - 0.20 8:30AM-1.1 10 am: 1.0 2PM-    1.0 6PM-    1.2  Bolus CHO Ratio (1unit:CHO) MN- 1:10  Correction/Sensitivity: MN- 1:60 6AM- 1:50 10PM- 1:60  Target: 120  Active insulin  time: 4 hours  Prior diabetic medications: Stopped metformin  due to diarrhea.  CONTINUOUS GLUCOSE MONITORING SYSTEM (CGMS) / INSULIN  PUMP INTERPRETATION:                         OmniPod Dash Download (Reviewed and summarized below.)  Dates: OmniPod downloaded data not for last 14 days, it was 4 July.  He was recommended for OmniPod 5 previously however he does not  want to be on Dexcom CGM and preferred to stay on OmniPod Dash.    Glycemic data:    CONTINUOUS GLUCOSE MONITORING SYSTEM (CGMS) INTERPRETATION: At today's visit, we reviewed CGM downloads. The full report is scanned in the media. Reviewing the CGM trends, blood glucose are as follows:  FreeStyle Libre 3 + CGM-  Sensor Download (Sensor download was reviewed and summarized below.) Dates: October 22 to March 17, 2024, 14 days   Glucose Management Indicator: 6.8%    Previous :      Interpretation: Mostly acceptable blood sugar, occasional blood sugar/hypoglycemia mild with blood sugar in 60s in the late afternoon related to skipping lunch.  Blood sugar overnight and other times are acceptable.  In the early morning blood sugars slowly trending up however no significant hyperglycemia.  Hypoglycemia: Patient has minor hypoglycemic episodes. Patient has hypoglycemia awareness.    Factors modifying glucose control: 1.  Diabetic diet assessment: 2-3 meals a day.  2.  Staying active or exercising:   3.  Medication compliance: compliant all of the time.  # B12 deficiency: Adequately replaced and he needs to continue his supplement long-term.  Interval history  CGM data as reviewed above.  Reviewed pump setting from the PDM.  Hemoglobin A1c today 6.3%.  He has been taking Jardiance  10 mg daily, denies any UTI or any new issues.  No other complaints today.  REVIEW OF SYSTEMS As per history of present illness.   PAST MEDICAL HISTORY: Past Medical History:  Diagnosis Date   Diabetes mellitus (HCC) 2,000   Erectile dysfunction    HTN (hypertension)    Hyperlipidemia    Personal history of colonic adenomas 07/18/2009   Renal cancer (HCC)    1992, has 1 kidney ; does not see urology any more    PAST SURGICAL HISTORY: Past Surgical History:  Procedure Laterality Date   COLONOSCOPY     HAND SURGERY Right 06/2022   NEPHRECTOMY  1992    ALLERGIES: No Known  Allergies  FAMILY HISTORY:  Family History  Problem Relation Age of Onset   Diabetes Father    Hypertension Father    CAD Father        F (MI age 14), Brother in hois 60   Lymphoma Sister    Dementia Brother        parkinsons? dementia?   Prostate cancer Brother        dx age 30   Cancer Brother        blood cancer    CAD Brother    Colon cancer Neg Hx    Stroke Neg Hx     SOCIAL HISTORY: Social History   Socioeconomic History   Marital status: Married    Spouse name: Not on file   Number of children: 1   Years of education: Not on file   Highest education level: Not on file  Occupational History   Occupation: retired   Tobacco Use   Smoking status: Former   Smokeless tobacco: Never   Tobacco comments:    quit in 1992, 2 ppd  Substance and Sexual Activity   Alcohol use: Not on file   Drug use: No    Comment: used to use marihuana     Sexual activity: Not on file  Other Topics Concern   Not on file  Social History Narrative   Lives w/ wife   Social Drivers of Corporate Investment Banker Strain: Not on file  Food Insecurity: Not on file  Transportation Needs: Not on file  Physical Activity: Not on file  Stress: Not on file  Social Connections: Not on file    MEDICATIONS:  Current Outpatient Medications  Medication Sig Dispense Refill   Cholecalciferol (VITAMIN D3) 50 MCG (2000 UT) CAPS Take by mouth.     Continuous Glucose Sensor (FREESTYLE LIBRE 3 PLUS SENSOR) MISC Inject 1 Device into the skin continuous. Change every 15 days 6 each 3   empagliflozin  (JARDIANCE ) 10 MG TABS tablet Take 1 tablet (10 mg total) by mouth daily before breakfast. 100 tablet 3   ferrous sulfate 325 (65 FE) MG tablet Take 325 mg by mouth daily with breakfast.     HUMALOG  100 UNIT/ML injection INJECT 68 UNITS UNDER THE SKIN ONCE DAILY 20 mL 2   Insulin  Disposable Pump (OMNIPOD DASH PODS, GEN 4,) MISC CHANGE POD EVERY 72 HOURS AS DIRECTED 30 each 6   losartan  (COZAAR ) 50 MG  tablet Take 1.5 tablets (75 mg total) by mouth daily. 135 tablet 3   niacin 500 MG tablet Take 500 mg by mouth at bedtime. (Patient not taking: Reported on 12/12/2023)     pravastatin  (PRAVACHOL ) 20 MG tablet TAKE 1 TABLET  BY MOUTH DAILY 90 tablet 1   tamsulosin  (FLOMAX ) 0.4 MG CAPS capsule Take 1 capsule (0.4 mg total) by mouth daily after supper. 90 capsule 3   No current facility-administered medications for this visit.    PHYSICAL EXAM: Vitals:   03/17/24 1107  BP: 138/62  Pulse: 63  Resp: 18  SpO2: 99%  Weight: 156 lb 6.4 oz (70.9 kg)  Height: 5' 8 (1.727 m)     Body mass index is 23.78 kg/m.  Wt Readings from Last 3 Encounters:  03/17/24 156 lb 6.4 oz (70.9 kg)  03/10/24 159 lb (72.1 kg)  12/12/23 159 lb 6.4 oz (72.3 kg)    General: Well developed, well nourished male in no apparent distress.  HEENT: AT/[place], no external lesions.  Eyes: Conjunctiva clear and no icterus. Neck: Neck supple  Lungs: Respirations not labored Neurologic: Alert, oriented, normal speech Extremities / Skin: Dry. Psychiatric: Does not appear depressed or anxious  Diabetic Foot Exam - Simple   No data filed    LABS Reviewed Lab Results  Component Value Date   HGBA1C 6.3 (A) 03/17/2024   HGBA1C 6.6 (H) 12/03/2023   HGBA1C 6.1 (A) 07/30/2023   Lab Results  Component Value Date   FRUCTOSAMINE 261 11/12/2017   Lab Results  Component Value Date   CHOL 100 12/03/2023   HDL 29.60 (L) 12/03/2023   LDLCALC 47 12/03/2023   LDLDIRECT 45.7 05/31/2009   TRIG 118.0 12/03/2023   CHOLHDL 3 12/03/2023   Lab Results  Component Value Date   MICRALBCREAT 131.9 (H) 12/03/2023   MICRALBCREAT 136 (H) 05/03/2023   Lab Results  Component Value Date   CREATININE 0.84 12/03/2023   Lab Results  Component Value Date   GFR 85.16 12/03/2023    ASSESSMENT / PLAN  1. Type 2 diabetes mellitus with hyperglycemia, with long-term current use of insulin  (HCC)    Diabetes Mellitus type 2, complicated  by microalbuminuria. - Diabetic status / severity: Controlled.  Lab Results  Component Value Date   HGBA1C 6.3 (A) 03/17/2024    - Hemoglobin A1c goal <6.5%   Mostly acceptable blood sugar on CGM freestyle libre 3+, with occasional mild hypoglycemia in the late afternoon..  - Medications:  OmniPod Dash insulin  Pump setting: Changed /decreased basal rate as follows Insulin  Pump setting:  Basal ( 18.55 units/hr) MN- 0.25u/hour 4 AM -    0.20, changed to 0.5 8:30AM-1.1, changed to 1 10 AM-  1.0, changed to 0.9 2PM-    1.0, changed to 0.9 6PM-    1.2, change to 1.0  Bolus CHO Ratio (1unit:CHO) MN- 1:10  Correction/Sensitivity: MN- 1:60 6AM- 1:50 10PM- 1:60  Target: 120  Continue Jardiance  10 mg daily.  - Home glucose testing: continue CGM and check blood glucose as needed.  - Discussed/ Gave Hypoglycemia treatment plan.  # Consult : not required at this time.   # Annual urine for microalbuminuria/ creatinine ratio, + microalbuminuria currently, continue ACE/ARB /losartan .   On Jardiance  SGLT2 inhibitor due to microalbuminuria. Last  Lab Results  Component Value Date   MICRALBCREAT 131.9 (H) 12/03/2023    # Foot check nightly.  # Annual dilated diabetic eye exams.   - Diet: Make healthy diabetic food choices - Life style / activity / exercise: Discussed.  2. Blood pressure  -  BP Readings from Last 1 Encounters:  03/17/24 138/62    - Control is in target.  - No change in current plans.  3. Lipid status / Hyperlipidemia -  Last  Lab Results  Component Value Date   LDLCALC 47 12/03/2023   - Continue pravastatin  20 mg daily.  He previously had been on Slo-Niacin OTC 500 mg twice a day for low HDL but had difficulty tolerating this.  Manuel Petty was seen today for diabetes.  Diagnoses and all orders for this visit:  Type 2 diabetes mellitus with hyperglycemia, with long-term current use of insulin  (HCC) -     Basic metabolic panel with GFR -      Hemoglobin A1c -     Microalbumin / creatinine urine ratio -     POCT glycosylated hemoglobin (Hb A1C)     DISPOSITION Follow up in clinic in 3 months suggested.  Labs prior to follow-up visit as ordered.   All questions answered and patient verbalized understanding of the plan.  Donnella Morford, MD [facility] Endocrinology [facility] [address] [place], [address] [place], [place] [address] Phone # [phone]  At least part of this note was generated using voice recognition software. Inadvertent word errors may have occurred, which were not recognized during the proofreading process.

## 2024-03-19 ENCOUNTER — Ambulatory Visit

## 2024-03-19 ENCOUNTER — Other Ambulatory Visit: Payer: Self-pay

## 2024-03-19 VITALS — BP 128/70 | Ht 68.0 in | Wt 156.0 lb

## 2024-03-19 DIAGNOSIS — M67952 Unspecified disorder of synovium and tendon, left thigh: Secondary | ICD-10-CM

## 2024-03-19 DIAGNOSIS — M7062 Trochanteric bursitis, left hip: Secondary | ICD-10-CM

## 2024-03-19 NOTE — Progress Notes (Signed)
   Subjective:    Patient ID: Manuel Petty, male    DOB: 78 y.o., 1946-02-17   MRN: [id number]  Chief Complaint: Left gluteus medius prolotherapy injection #1  History of Present Illness   Patient reports continued discomfort in this hip since last visit with myself States he was able to mow his lawn without much of an issue but it was following this when he was standing for a prolonged period of time that he began bothering him again Prolonged standing seems to be the most bothersome activity which brings this out.     Objective:   Vitals:   03/19/24 1417  BP: 128/70    Left gluteus medius prolotherapy Injection with Ultrasound Guidance Procedure Note Manuel Petty 1945/11/26 Indications: Pain Procedure Details Following the description of risks including infection, bleeding, and damage to surrounding structures patient provided written consent for Left gluteus medius prolotherapy injection with ultrasound guidance. Ultrasound was used to identify the Left gluteus medius. The patient was then prepped in the usual fashion with chlorhexidine. Following topical anesthetization with ethyl chloride, the patient was injected into the Left gluteus medius with a solution of bupivacaine 2%, sodium bicarbonate 8.4%, dextrose 15%. This was well visualized under ultrasound, please see associated photographic documentation. Patient tolerated well without complication. Precautions provided. Cleaned and dressing applied.   Assessment & Plan:   Assessment & Plan  Manuel Petty is a 78 year old male with past medical history significant for diabetes, hypertension, and renal cancer s/p unilateral nephrectomy presenting for follow-up on gluteus medius calcific tendinopathy.  He received a prolotherapy percutaneous needle tenotomy today under ultrasound guidance and tolerated this well.  Recommend follow-up in 2 weeks for injection #2.  Consider 3 injection series based on how he is doing in 2  weeks.

## 2024-04-01 ENCOUNTER — Other Ambulatory Visit: Payer: Self-pay

## 2024-04-01 ENCOUNTER — Ambulatory Visit

## 2024-04-01 VITALS — BP 128/78 | Ht 68.0 in | Wt 156.0 lb

## 2024-04-01 DIAGNOSIS — M7062 Trochanteric bursitis, left hip: Secondary | ICD-10-CM

## 2024-04-01 DIAGNOSIS — M67952 Unspecified disorder of synovium and tendon, left thigh: Secondary | ICD-10-CM

## 2024-04-01 NOTE — Progress Notes (Signed)
   Subjective:    Patient ID: Manuel Petty, male    DOB: 78 y.o., Dec 06, 1945   MRN: [id number]  Chief Complaint: Left gluteus medius prolotherapy injection #2  History of Present Illness  Patient reports 1% improvement Prolonged standing still seems to be the most bothersome activity which brings this out.     Objective:   Vitals:   04/01/24 1529  BP: 128/78    Left gluteus medius prolotherapy Injection with Ultrasound Guidance Procedure Note Manuel Petty Dec 02, 1945 Indications: Pain Procedure Details Following the description of risks including infection, bleeding, and damage to surrounding structures patient provided written consent for Left gluteus medius prolotherapy injection with ultrasound guidance. Ultrasound was used to identify the Left gluteus medius. The patient was then prepped in the usual fashion with chlorhexidine. Following topical anesthetization with ethyl chloride, the patient was injected into the Left gluteus medius with a solution of bupivacaine 2%, sodium bicarbonate 8.4%, dextrose 15%. This was well visualized under ultrasound, please see associated photographic documentation. Patient tolerated well without complication. Precautions provided. Cleaned and dressing applied.   Assessment & Plan:   Assessment & Plan  Manuel Petty is a 78 year old male with past medical history significant for diabetes, hypertension, and renal cancer s/p unilateral nephrectomy presenting for follow-up on gluteus medius calcific tendinopathy.  He received a prolotherapy percutaneous needle tenotomy today under ultrasound guidance and tolerated this well.  Recommend follow-up in 2 weeks for injection #3.

## 2024-04-15 ENCOUNTER — Other Ambulatory Visit: Payer: Self-pay

## 2024-04-15 ENCOUNTER — Ambulatory Visit

## 2024-04-15 VITALS — BP 130/70 | Ht 68.0 in | Wt 156.0 lb

## 2024-04-15 DIAGNOSIS — M7062 Trochanteric bursitis, left hip: Secondary | ICD-10-CM

## 2024-04-15 DIAGNOSIS — M67952 Unspecified disorder of synovium and tendon, left thigh: Secondary | ICD-10-CM

## 2024-04-15 MED ORDER — METHYLPREDNISOLONE ACETATE 40 MG/ML IJ SUSP
40.0000 mg | Freq: Once | INTRAMUSCULAR | Status: AC
  Administered 2024-04-15: 10 mg via INTRA_ARTICULAR

## 2024-04-15 NOTE — Progress Notes (Signed)
   Subjective:    Patient ID: Manuel Petty, male    DOB: 78 y.o., Sep 19, 1945   MRN: [id number]  Chief Complaint: Left gluteus medius prolotherapy injection #3  History of Present Illness  Patient reports 20-25% improvement Prolonged standing still seems to be the most bothersome activity which brings this out.     Objective:   There were no vitals filed for this visit.   Left gluteus medius prolotherapy Injection with Ultrasound Guidance Procedure Note Manuel Petty 05-12-1946 Indications: Pain Procedure Details Following the description of risks including infection, bleeding, and damage to surrounding structures patient provided written consent for Left gluteus medius prolotherapy injection with ultrasound guidance. Ultrasound was used to identify the Left gluteus medius. The patient was then prepped in the usual fashion with chlorhexidine. Following topical anesthetization with ethyl chloride, the patient was injected into the Left gluteus medius with a solution of bupivacaine 2%, sodium bicarbonate 8.4%, dextrose 15%. This was well visualized under ultrasound, please see associated photographic documentation. Patient tolerated well without complication. Precautions provided. Cleaned and dressing applied.   Assessment & Plan:   Assessment & Plan  Manuel Petty is a 78 year old male with past medical history significant for diabetes, hypertension, and renal cancer s/p unilateral nephrectomy presenting for follow-up on gluteus medius calcific tendinopathy.  He received a prolotherapy percutaneous needle tenotomy today under ultrasound guidance and tolerated this well. Continue PT and follow up in 4-6 weeks.

## 2024-05-10 ENCOUNTER — Other Ambulatory Visit: Payer: Self-pay | Admitting: Endocrinology

## 2024-05-10 DIAGNOSIS — E1165 Type 2 diabetes mellitus with hyperglycemia: Secondary | ICD-10-CM

## 2024-05-27 ENCOUNTER — Ambulatory Visit

## 2024-05-27 NOTE — Progress Notes (Signed)
" ° °  Subjective:    Patient ID: Manuel Petty, male    DOB: 79 y.o., Sep 24, 1945   MRN: [id number]  Chief Complaint: Left gluteus medius tendinopathy (10-week follow-up)  Discussed the use of AI scribe software for clinical note transcription with the patient, who gave verbal consent to proceed.  History of Present Illness Manuel Petty is a very pleasant 79 year old male with diabetes, hypertension, and renal cancer s/p unilateral nephrectomy originally diagnosed by myself on 03/10/2024 with greater trochanteric pain syndrome on the left most likely due to calcific gluteus medius tendinopathy (confirmed on ultrasound at that time).  He has since been engaged with a home exercise program assiduously and underwent 3 prolotherapy injections achieving 20-25% improvement at the time of the third injection. Today he reports 30-50%. Still doing HEP. Discomfort when standing prolonged periods in the kitchen only. Discomfort dissipates within 5 mins when sitting. Does not bother him at any other time of day and does not limit him in terms of daily activities.     Objective:   Vitals:   05/28/24 1102  BP: 120/72   Left hip (compared to normal) -Inspection: No swelling or skin changes. No leg length discrepancy. No gait abnormalities with walking. -Palpation: TTP - level ASIS, - level AIIS, - adductor, - greater trochanter, - SI joint, - over piriformis -AROM/PROM: Flexion 120 deg, abduction 45 deg, adduction 20 deg, extension 10 deg, ER 45 deg, IR 25 deg, low hamstring flexibility -Strength: 5/5 flexion, 5/5 abduction, 5/5 adduction, 5/5 extension -Special tests: -  FADIR, -  log roll, + Thomas, - Ober, - Noble, - FABER, - piriformis testing, - SLR, - hop test  Extracorporeal Shockwave Therapy Procedure Following the description of risks including pain, bruising, local skin irritation, damage to surrounding structures, patient provided verbal/written consent for ESWT procedure. Palpation was used to  identify the left greater trochanter. Patient and probe was sterilely prepped in the usual fashion with alcohol.  Total strikes: 2000 Intensity: 120 Frequency: 12  Patient tolerated well without complication. Precautions provided.   Assessment & Plan:   Assessment & Plan Greater trochanteric pain syndrome of left hip May be referred from low back to some degree but my eval today still favors GTPS. The patient is pleased with his progress thus far and so am I. Continue home exercises. Can consider shockwave therapy. Provided 1 treatment today pro bono. Plan to avoid NSAIDs given solitary kidney. Could consider MRI (likely hip rather than L spine but either would be reasonable). Follow up as needed.   "

## 2024-05-28 ENCOUNTER — Ambulatory Visit

## 2024-05-28 ENCOUNTER — Encounter: Payer: Self-pay | Admitting: Endocrinology

## 2024-05-28 VITALS — BP 120/72 | Ht 68.0 in | Wt 156.0 lb

## 2024-05-28 DIAGNOSIS — M67952 Unspecified disorder of synovium and tendon, left thigh: Secondary | ICD-10-CM | POA: Diagnosis not present

## 2024-05-28 DIAGNOSIS — E1165 Type 2 diabetes mellitus with hyperglycemia: Secondary | ICD-10-CM

## 2024-05-28 DIAGNOSIS — M7062 Trochanteric bursitis, left hip: Secondary | ICD-10-CM

## 2024-05-28 DIAGNOSIS — E538 Deficiency of other specified B group vitamins: Secondary | ICD-10-CM

## 2024-05-28 DIAGNOSIS — Z1329 Encounter for screening for other suspected endocrine disorder: Secondary | ICD-10-CM

## 2024-05-28 DIAGNOSIS — E782 Mixed hyperlipidemia: Secondary | ICD-10-CM

## 2024-05-28 DIAGNOSIS — E559 Vitamin D deficiency, unspecified: Secondary | ICD-10-CM

## 2024-05-28 NOTE — Telephone Encounter (Signed)
 I am not sure what lab is located in that location. I placed order for [facility]. These lab may not transferred, he may need to carry paper prescription. I have placed orders. Please check the printer.   Devory Mckinzie, MD [facility] Endocrinology [facility] [address] [place], [address] [place], [place] [address] Phone # [phone]

## 2024-06-01 NOTE — Addendum Note (Signed)
 Addended by: MAREE HUM A on: 06/01/2024 03:24 PM   Modules accepted: Orders

## 2024-06-17 ENCOUNTER — Other Ambulatory Visit

## 2024-06-17 DIAGNOSIS — E559 Vitamin D deficiency, unspecified: Secondary | ICD-10-CM

## 2024-06-17 DIAGNOSIS — E782 Mixed hyperlipidemia: Secondary | ICD-10-CM

## 2024-06-17 DIAGNOSIS — E1165 Type 2 diabetes mellitus with hyperglycemia: Secondary | ICD-10-CM

## 2024-06-17 DIAGNOSIS — Z794 Long term (current) use of insulin: Secondary | ICD-10-CM

## 2024-06-17 DIAGNOSIS — E538 Deficiency of other specified B group vitamins: Secondary | ICD-10-CM

## 2024-06-17 DIAGNOSIS — Z1329 Encounter for screening for other suspected endocrine disorder: Secondary | ICD-10-CM

## 2024-06-17 LAB — COMPREHENSIVE METABOLIC PANEL WITH GFR
ALT: 24 U/L (ref 3–53)
AST: 15 U/L (ref 5–37)
Albumin: 4.4 g/dL (ref 3.5–5.2)
Alkaline Phosphatase: 85 U/L (ref 39–117)
BUN: 20 mg/dL (ref 6–23)
CO2: 21 meq/L (ref 19–32)
Calcium: 9 mg/dL (ref 8.4–10.5)
Chloride: 107 meq/L (ref 96–112)
Creatinine, Ser: 0.79 mg/dL (ref 0.40–1.50)
GFR: 86.42 mL/min
Glucose, Bld: 154 mg/dL — ABNORMAL HIGH (ref 70–99)
Potassium: 4.5 meq/L (ref 3.5–5.1)
Sodium: 137 meq/L (ref 135–145)
Total Bilirubin: 0.5 mg/dL (ref 0.2–1.2)
Total Protein: 7 g/dL (ref 6.0–8.3)

## 2024-06-17 LAB — LIPID PANEL
Cholesterol: 93 mg/dL (ref 28–200)
HDL: 26.5 mg/dL — ABNORMAL LOW
LDL Cholesterol: 40 mg/dL (ref 10–99)
NonHDL: 66.11
Total CHOL/HDL Ratio: 3
Triglycerides: 132 mg/dL (ref 10.0–149.0)
VLDL: 26.4 mg/dL (ref 0.0–40.0)

## 2024-06-17 LAB — CBC WITH DIFFERENTIAL/PLATELET
Basophils Absolute: 0 10*3/uL (ref 0.0–0.1)
Basophils Relative: 0.5 % (ref 0.0–3.0)
Eosinophils Absolute: 0.2 10*3/uL (ref 0.0–0.7)
Eosinophils Relative: 2.7 % (ref 0.0–5.0)
HCT: 40 % (ref 39.0–52.0)
Hemoglobin: 13.4 g/dL (ref 13.0–17.0)
Lymphocytes Relative: 15.6 % (ref 12.0–46.0)
Lymphs Abs: 1.3 10*3/uL (ref 0.7–4.0)
MCHC: 33.5 g/dL (ref 30.0–36.0)
MCV: 96.2 fl (ref 78.0–100.0)
Monocytes Absolute: 0.7 10*3/uL (ref 0.1–1.0)
Monocytes Relative: 8.7 % (ref 3.0–12.0)
Neutro Abs: 6.1 10*3/uL (ref 1.4–7.7)
Neutrophils Relative %: 72.5 % (ref 43.0–77.0)
Platelets: 279 10*3/uL (ref 150.0–400.0)
RBC: 4.15 Mil/uL — ABNORMAL LOW (ref 4.22–5.81)
RDW: 14.5 % (ref 11.5–15.5)
WBC: 8.4 10*3/uL (ref 4.0–10.5)

## 2024-06-17 LAB — HEMOGLOBIN A1C: Hgb A1c MFr Bld: 6.6 % — ABNORMAL HIGH (ref 4.6–6.5)

## 2024-06-17 LAB — VITAMIN D 25 HYDROXY (VIT D DEFICIENCY, FRACTURES): VITD: 9.64 ng/mL — ABNORMAL LOW (ref 30.00–100.00)

## 2024-06-17 LAB — T4, FREE: Free T4: 0.88 ng/dL (ref 0.60–1.60)

## 2024-06-17 LAB — TSH: TSH: 1.26 u[IU]/mL (ref 0.35–5.50)

## 2024-06-17 LAB — VITAMIN B12: Vitamin B-12: 328 pg/mL (ref 211–911)

## 2024-06-18 ENCOUNTER — Ambulatory Visit: Payer: Self-pay | Admitting: Endocrinology

## 2024-06-24 ENCOUNTER — Ambulatory Visit: Admitting: Endocrinology
